# Patient Record
Sex: Male | Born: 1985 | Race: Black or African American | Hispanic: No | Marital: Single
Health system: Southern US, Community
[De-identification: ages and names within clinical notes are randomized; demographics above are authoritative.]

## PROBLEM LIST (undated history)

## (undated) DIAGNOSIS — R569 Unspecified convulsions: Secondary | ICD-10-CM

## (undated) DIAGNOSIS — Q040 Congenital malformations of corpus callosum: Secondary | ICD-10-CM

## (undated) DIAGNOSIS — Q048 Other specified congenital malformations of brain: Secondary | ICD-10-CM

## (undated) DIAGNOSIS — L02214 Cutaneous abscess of groin: Secondary | ICD-10-CM

## (undated) DIAGNOSIS — J189 Pneumonia, unspecified organism: Secondary | ICD-10-CM

## (undated) DIAGNOSIS — G40901 Epilepsy, unspecified, not intractable, with status epilepticus: Secondary | ICD-10-CM

## (undated) DIAGNOSIS — G809 Cerebral palsy, unspecified: Secondary | ICD-10-CM

## (undated) HISTORY — DX: Unspecified convulsions: R56.9

## (undated) HISTORY — DX: Congenital malformations of corpus callosum: Q04.0

## (undated) HISTORY — PX: STRABISMUS SURGERY: SHX218

## (undated) HISTORY — DX: Cutaneous abscess of groin: L02.214

## (undated) HISTORY — DX: Pneumonia, unspecified organism: J18.9

## (undated) HISTORY — DX: Other specified congenital malformations of brain: Q04.8

## (undated) HISTORY — DX: Epilepsy, unspecified, not intractable, with status epilepticus: G40.901

---
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
## 2006-04-24 ENCOUNTER — Ambulatory Visit: Payer: Self-pay | Admitting: Family Medicine

## 2006-07-13 ENCOUNTER — Ambulatory Visit: Payer: Self-pay | Admitting: Family Medicine

## 2006-09-25 ENCOUNTER — Emergency Department (HOSPITAL_COMMUNITY): Admission: EM | Admit: 2006-09-25 | Discharge: 2006-09-26 | Payer: Self-pay | Admitting: Emergency Medicine

## 2006-12-15 ENCOUNTER — Ambulatory Visit: Payer: Self-pay | Admitting: Family Medicine

## 2007-01-13 ENCOUNTER — Ambulatory Visit: Payer: Self-pay | Admitting: Family Medicine

## 2007-01-14 DIAGNOSIS — R569 Unspecified convulsions: Secondary | ICD-10-CM

## 2007-01-14 DIAGNOSIS — L2089 Other atopic dermatitis: Secondary | ICD-10-CM

## 2007-01-14 DIAGNOSIS — F819 Developmental disorder of scholastic skills, unspecified: Secondary | ICD-10-CM | POA: Insufficient documentation

## 2007-01-14 DIAGNOSIS — G809 Cerebral palsy, unspecified: Secondary | ICD-10-CM

## 2007-07-14 ENCOUNTER — Ambulatory Visit: Payer: Self-pay | Admitting: Family Medicine

## 2007-12-27 ENCOUNTER — Encounter (INDEPENDENT_AMBULATORY_CARE_PROVIDER_SITE_OTHER): Payer: Self-pay | Admitting: Family Medicine

## 2008-02-14 ENCOUNTER — Encounter (INDEPENDENT_AMBULATORY_CARE_PROVIDER_SITE_OTHER): Payer: Self-pay | Admitting: Family Medicine

## 2008-03-02 ENCOUNTER — Ambulatory Visit: Payer: Self-pay | Admitting: Family Medicine

## 2008-03-02 DIAGNOSIS — L02214 Cutaneous abscess of groin: Secondary | ICD-10-CM

## 2008-03-02 HISTORY — DX: Cutaneous abscess of groin: L02.214

## 2008-03-29 ENCOUNTER — Encounter (INDEPENDENT_AMBULATORY_CARE_PROVIDER_SITE_OTHER): Payer: Self-pay | Admitting: Family Medicine

## 2008-06-07 ENCOUNTER — Ambulatory Visit: Payer: Self-pay | Admitting: Family Medicine

## 2008-06-07 ENCOUNTER — Encounter (INDEPENDENT_AMBULATORY_CARE_PROVIDER_SITE_OTHER): Payer: Self-pay | Admitting: Family Medicine

## 2008-06-20 ENCOUNTER — Encounter (INDEPENDENT_AMBULATORY_CARE_PROVIDER_SITE_OTHER): Payer: Self-pay | Admitting: Family Medicine

## 2008-07-03 ENCOUNTER — Encounter (INDEPENDENT_AMBULATORY_CARE_PROVIDER_SITE_OTHER): Payer: Self-pay | Admitting: Family Medicine

## 2008-07-14 ENCOUNTER — Ambulatory Visit: Payer: Self-pay | Admitting: Family Medicine

## 2008-08-15 ENCOUNTER — Encounter (INDEPENDENT_AMBULATORY_CARE_PROVIDER_SITE_OTHER): Payer: Self-pay | Admitting: Family Medicine

## 2008-10-31 ENCOUNTER — Telehealth (INDEPENDENT_AMBULATORY_CARE_PROVIDER_SITE_OTHER): Payer: Self-pay | Admitting: Family Medicine

## 2008-11-01 ENCOUNTER — Ambulatory Visit: Payer: Self-pay | Admitting: Family Medicine

## 2008-12-08 ENCOUNTER — Encounter (INDEPENDENT_AMBULATORY_CARE_PROVIDER_SITE_OTHER): Payer: Self-pay | Admitting: Family Medicine

## 2008-12-26 ENCOUNTER — Encounter (INDEPENDENT_AMBULATORY_CARE_PROVIDER_SITE_OTHER): Payer: Self-pay | Admitting: Family Medicine

## 2009-04-02 ENCOUNTER — Encounter: Admission: RE | Admit: 2009-04-02 | Discharge: 2009-07-01 | Payer: Self-pay | Admitting: Psychiatry

## 2009-04-18 ENCOUNTER — Encounter: Payer: Self-pay | Admitting: Family Medicine

## 2009-04-25 ENCOUNTER — Encounter (INDEPENDENT_AMBULATORY_CARE_PROVIDER_SITE_OTHER): Payer: Self-pay | Admitting: Family Medicine

## 2009-04-27 ENCOUNTER — Ambulatory Visit: Payer: Self-pay | Admitting: Family Medicine

## 2009-04-27 ENCOUNTER — Telehealth: Payer: Self-pay | Admitting: *Deleted

## 2009-04-30 ENCOUNTER — Ambulatory Visit: Payer: Self-pay | Admitting: Family Medicine

## 2009-05-16 ENCOUNTER — Encounter: Payer: Self-pay | Admitting: Family Medicine

## 2009-06-14 ENCOUNTER — Encounter: Payer: Self-pay | Admitting: Family Medicine

## 2009-06-23 ENCOUNTER — Emergency Department (HOSPITAL_COMMUNITY): Admission: EM | Admit: 2009-06-23 | Discharge: 2009-06-23 | Payer: Self-pay | Admitting: Emergency Medicine

## 2009-06-29 ENCOUNTER — Ambulatory Visit: Payer: Self-pay | Admitting: Family Medicine

## 2009-08-01 ENCOUNTER — Encounter: Payer: Self-pay | Admitting: Family Medicine

## 2009-08-01 DIAGNOSIS — N3946 Mixed incontinence: Secondary | ICD-10-CM | POA: Insufficient documentation

## 2009-08-02 ENCOUNTER — Encounter: Payer: Self-pay | Admitting: Family Medicine

## 2009-08-28 ENCOUNTER — Encounter: Payer: Self-pay | Admitting: Family Medicine

## 2009-11-13 ENCOUNTER — Encounter: Payer: Self-pay | Admitting: Family Medicine

## 2010-05-23 ENCOUNTER — Encounter: Payer: Self-pay | Admitting: Family Medicine

## 2010-05-28 ENCOUNTER — Encounter: Payer: Self-pay | Admitting: Family Medicine

## 2010-07-02 ENCOUNTER — Ambulatory Visit: Payer: Self-pay | Admitting: Family Medicine

## 2010-07-02 ENCOUNTER — Encounter: Payer: Self-pay | Admitting: Family Medicine

## 2010-09-13 ENCOUNTER — Ambulatory Visit: Payer: Self-pay | Admitting: Family Medicine

## 2010-11-12 ENCOUNTER — Encounter: Payer: Self-pay | Admitting: Family Medicine

## 2010-12-06 ENCOUNTER — Encounter: Payer: Self-pay | Admitting: Family Medicine

## 2010-12-17 NOTE — Assessment & Plan Note (Signed)
Summary: seizures,df   Vital Signs:  Patient profile:   25 year old male Weight:      97.8 pounds Temp:     98.0 degrees F Pulse (ortho):   96 / minute Resp:     16 per minute BP sitting:   126 / 84  Vitals Entered By: Jone Baseman CMA (September 13, 2010 2:09 PM) CC: seizures   Primary Care Tomi Paddock:  Levander Campion MD  CC:  seizures.  History of Present Illness: 1. Seizures:  Pt is here for evaluation of his seizures.  Pt with a hx of cerebral palsy and a seizure disorder.  He has been having more frequent seizures over the past week.  He normally has 1-2 seizures a month however, in the past week he has had 10 seizures.  They are the same seizures that he normally has but are more frequent.  They cannot think of any reason why he could be having more seizures.  He is taking the medicines as prescribed.  He is otherwise acting like himself.  Has had no fevers, eating well, normal stools and voids.  ROS: denies fevers, increased stiffness, cough, rashes, ulcers, abdominal pain, shortness of breath  Current Medications (verified): 1)  Lamictal 200 Mg Tabs (Lamotrigine) .Marland Kitchen.. 1 By Mouth Two Times A Day 2)  Triamcinolone Acetonide 0.1 % Crea (Triamcinolone Acetonide) .... Apply A Small Amount To Affected Area Twice A Day. Dispense One Tube. 3)  Fleet Enema 7-19 Gm/164ml Enem (Sodium Phosphates) .Marland Kitchen.. 1 Enema Q 3 Days As Needed Constipation 4)  Glycolax  Powd (Polyethylene Glycol 3350) .Marland Kitchen.. 1 Teaspoon Po Daily 5)  Physical Therapy Assessment .... To Asses Ongoing Pt Needs and Identify Any Durable Equipment Needed For Daily Functioning 6)  Anti-Spasticity Ball Splints .... Bilateral Left and Right Hand Wrise  Dx- Cerebal Palsy 7)  Hip Abductor Wedge .... Dx- Cerebral Palsy 8)  Lamictal 25 Mg Tabs (Lamotrigine) .... 2 Tabs By Mouth Two Times A Day For Total of 250mg  Two Times A Day 9)  Sulfamethoxazole-Trimethoprim 400-80 Mg/70ml Soln (Sulfamethoxazole-Trimethoprim) .... 3 Teaspoons 3  Times Per Day Qs X 7 Days  Allergies: No Known Drug Allergies  Past History:  Past Medical History: Reviewed history from 06/29/2009 and no changes required. Absence of corpus callosum, Colpocephaly w/large temproal horns, Elevated third ventricle, Floppy lid syndrome, Partial seizures with secondary generalization, Spastic quadriparesis  Neurologist- Dr. Sharene Skeans Dr. Myrtis Ser- [facility]  Social History: Reviewed history from 06/29/2009 and no changes required. Lives with mom, dad in [place].   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  Only child.;attends [employer] Likes loud toys, slams cabinets, enjoys wheel of fortune  Physical Exam  General:  alert, in wheelchair. spastic. Vital signs noted  Head:  normocephalic and atraumatic.   Eyes:  vision grossly intact, pupils equal, pupils round, pupils reactive to light, ,  Ears:  TM clear bilat, no erythema Nose:  no nasal discharge.   Mouth:  moist mucus membranes with copious drool Neck:  supple and no masses.   Lungs:  Normal respiratory effort, chest expands symmetrically. Lungs are clear to auscultation, no crackles or wheezes. Heart:  Normal rate and regular rhythm. S1 and S2 normal without gallop, murmur, click, rub or other extra sounds. Abdomen:  Bowel sounds positive,abdomen soft and non-tender.  Msk:  moves upper extremities equally, upper extremity contractures  Extremities:  No edema, contracture upper ext- hands Neurologic:  alert nonverbal cries out    Skin:  no rashes or suspicious skin lesions  Impression & Recommendations:  Problem # 1:  CONVULSIONS, SEIZURES, NOS (ICD-780.39) Assessment Deteriorated  Having more frequent seizures that at baseline.  No medical reasons like an infection identified.  Taking his medicines as prescribed.  Will arrange for close follow up with Dr. Sharene Skeans.  Precautions given. His updated medication list for this problem includes:    Lamictal 200 Mg Tabs (Lamotrigine) .Marland Kitchen... 1 by mouth two  times a day    Lamictal 25 Mg Tabs (Lamotrigine) .Marland Kitchen... 2 tabs by mouth two times a day for total of 250mg  two times a day  Orders: [facility]- Est Level  3 ([address])  Complete Medication List: 1)  Lamictal 200 Mg Tabs (Lamotrigine) .Marland Kitchen.. 1 by mouth two times a day 2)  Triamcinolone Acetonide 0.1 % Crea (Triamcinolone acetonide) .... Apply a small amount to affected area twice a day. dispense one tube. 3)  Fleet Enema 7-19 Gm/161ml Enem (Sodium phosphates) .Marland Kitchen.. 1 enema q 3 days as needed constipation 4)  Glycolax Powd (Polyethylene glycol 3350) .Marland Kitchen.. 1 teaspoon po daily 5)  Physical Therapy Assessment  .... To asses ongoing pt needs and identify any durable equipment needed for daily functioning 6)  Anti-spasticity Ball Splints  .... Bilateral left and right hand wrise  dx- cerebal palsy 7)  Hip Abductor Wedge  .... Dx- cerebral palsy 8)  Lamictal 25 Mg Tabs (Lamotrigine) .... 2 tabs by mouth two times a day for total of 250mg  two times a day 9)  Sulfamethoxazole-trimethoprim 400-80 Mg/20ml Soln (Sulfamethoxazole-trimethoprim) .... 3 teaspoons 3 times per day qs x 7 days  Patient Instructions: 1)  We will work on trying to get you in to see Dr. Sharene Skeans as soon as we can. 2)  We will call you with that appointment. 3)  I cannot see any reason why he is having more seizures from a medical standpoint 4)  If he continues to have seizures or he develops new symptoms like fevers, cough, increased stiffness then I would take him to the ED.   Orders Added: 1)  [facility]- Est Level  3 [[address]454]  Appended Document: seizures,df Next available appointment set up with Dr. Sharene Skeans on 11/07.  Mother called and made aware. {USER.REALNAME}  {DATETIMESTAMP()}

## 2010-12-17 NOTE — Miscellaneous (Signed)
Summary: Physical form   Patients father dropped off forms to be filled out.  Please call him when completed. Bradly Bienenstock  May 28, 2010 9:07 AM forms to pcp.Golden Circle RN  May 28, 2010 9:23 AM

## 2010-12-17 NOTE — Miscellaneous (Signed)
Summary: Orders Update  Clinical Lists Changes  Medications: Added new medication of * PHYSICAL THERAPY ASSESSMENT To asses ongoing PT needs and identify any durable equipment needed for daily functioning - Signed Added new medication of * ANTI-SPASTICITY BALL SPLINTS Bilateral Left and Right hand Dortha Schwalbe  Dx- Cerebal Palsy - Signed Added new medication of * HIP ABDUCTOR WEDGE Dx- Cerebral Palsy - Signed Rx of PHYSICAL THERAPY ASSESSMENT To asses ongoing PT needs and identify any durable equipment needed for daily functioning;  #0 x 0;  Signed;  Entered by: Milinda Antis MD;  Authorized by: Milinda Antis MD;  Method used: Print then Give to Patient Rx of ANTI-SPASTICITY BALL SPLINTS Bilateral Left and Right hand Dortha Schwalbe  Dx- Cerebal Palsy;  #0 x 0;  Signed;  Entered by: Milinda Antis MD;  Authorized by: Milinda Antis MD;  Method used: Print then Give to Patient Rx of HIP ABDUCTOR WEDGE Dx- Cerebral Palsy;  #0 x 0;  Signed;  Entered by: Milinda Antis MD;  Authorized by: Milinda Antis MD;  Method used: Print then Give to Patient    Prescriptions: HIP ABDUCTOR WEDGE Dx- Cerebral Palsy  #0 x 0   Entered and Authorized by:   Milinda Antis MD   Signed by:   Milinda Antis MD on 05/23/2010   Method used:   Print then Give to Patient   RxID:   [id number] ANTI-SPASTICITY BALL SPLINTS Bilateral Left and Right hand Dortha Schwalbe  Dx- Cerebal Palsy  #0 x 0   Entered and Authorized by:   Milinda Antis MD   Signed by:   Milinda Antis MD on 05/23/2010   Method used:   Print then Give to Patient   RxID:   [id number] PHYSICAL THERAPY ASSESSMENT To asses ongoing PT needs and identify any durable equipment needed for daily functioning  #0 x 0   Entered and Authorized by:   Milinda Antis MD   Signed by:   Milinda Antis MD on 05/23/2010   Method used:   Print then Give to Patient   RxID:   [id number]

## 2010-12-17 NOTE — Consult Note (Signed)
Summary: HH Discharge summary  [facility] Discharge summary   Imported By: De Nurse 11/27/2009 13:50:12  _____________________________________________________________________  External Attachment:    Type:   Image     Comment:   External Document

## 2010-12-17 NOTE — Letter (Signed)
Summary: [facility] Medical Exam  [facility] Medical Exam   Imported By: Clydell Hakim 07/05/2010 15:56:25  _____________________________________________________________________  External Attachment:    Type:   Image     Comment:   External Document

## 2010-12-17 NOTE — Assessment & Plan Note (Signed)
Summary: cpe/eo   Vital Signs:  Patient profile:   25 year old male Temp:     98.1 degrees F axillary Pulse rate:   88 / minute BP sitting:   112 / 73  (right arm) Cuff size:   regular  Vitals Entered By: Tessie Fass CMA (July 02, 2010 1:58 PM) CC: CPE   Primary Care Provider:  Levander Campion MD  CC:  CPE.  History of Present Illness:   25 y.o male with severe CP , seizure disorder followed by Dr. Sharene Skeans presents for CPE, yearly PE exam sheets signed for patient as well.  1. CP- severe CP, recently broke wheelchair needs a script for repair, incontinence supplies reordered.. Pt attends [employer]. For multiple contractures gets Botox injections at [facility] [place] - Dr. 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 Seizure- only med Lamictal tolerating well,no rash. Followed by Dr. Sharene Skeans,- yearly labs due - within next week , increase in seizures, probable change in meds   3.boils on bottom-- x 1 week, using alchol, neosporin, perioxide ROS- no fever     Current Medications (verified): 1)  Lamictal 200 Mg Tabs (Lamotrigine) .Marland Kitchen.. 1 By Mouth Two Times A Day 2)  Triamcinolone Acetonide 0.1 % Crea (Triamcinolone Acetonide) .... Apply A Small Amount To Affected Area Twice A Day. Dispense One Tube. 3)  Fleet Enema 7-19 Gm/172ml Enem (Sodium Phosphates) .Marland Kitchen.. 1 Enema Q 3 Days As Needed Constipation 4)  Glycolax  Powd (Polyethylene Glycol 3350) .Marland Kitchen.. 1 Teaspoon Po Daily 5)  Physical Therapy Assessment .... To Asses Ongoing Pt Needs and Identify Any Durable Equipment Needed For Daily Functioning 6)  Anti-Spasticity Ball Splints .... Bilateral Left and Right Hand Wrise  Dx- Cerebal Palsy 7)  Hip Abductor Wedge .... Dx- Cerebral Palsy 8)  Lamictal 25 Mg Tabs (Lamotrigine) .... 2 Tabs By Mouth Two Times A Day For Total of 250mg  Two Times A Day 9)  Sulfamethoxazole-Trimethoprim 400-80  Mg/1ml Soln (Sulfamethoxazole-Trimethoprim) .... 3 Teaspoons 3 Times Per Day Qs X 7 Days  Allergies (verified): No Known Drug Allergies  Past History:  Past Medical History: Last updated: 06/29/2009 Absence of corpus callosum, Colpocephaly w/large temproal horns, Elevated third ventricle, Floppy lid syndrome, Partial seizures with secondary generalization, Spastic quadriparesis  [facility]  Physical Exam  General:  alert, in wheelchair. spastic. Vital signs noted  Eyes:  vision grossly intact, pupils equal, pupils round, pupils reactive to light, ,  Ears:  TM clear bilat, no erythema Mouth:  moist mucus membranes with copious drool Lungs:  Normal respiratory effort, chest expands symmetrically. Lungs are clear to auscultation, no crackles or wheezes. Heart:  Normal rate and regular rhythm. S1 and S2 normal without gallop, murmur, click, rub or other extra sounds. Abdomen:  Bowel sounds positive,abdomen soft and non-tender.  Rectal:  no external abnormalities.   Genitalia:  no penile lesions, tested descended no hernia  Msk:  moves all upper extremities equally, upper extremity contractures able to extend right finger more Skin:  multiple 2-9mm boils on right buttock,  few lesions open with no drainage, +erythema, TTP, mild induration Inguinal Nodes:  R inguinal LN enlarged and R inguinal LN tender.  Mobile   Impression & Recommendations:  Problem # 1:  PHYSICAL EXAMINATION (ICD-V70.0) Assessment New  Increase in seizure activity defer to neurology for meds, labs by Neuro No acute sick visits forms completed  Orders: [facility] - Est  18-39 yrs ([address])  Problem # 2:  URINARY INCONTINENCE, MIXED (ICD-788.33) Assessment: Unchanged  Orders: [facility] - Est  18-39 yrs (60454)  Problem # 3:  BOILS, RECURRENT (ICD-680.9) Assessment: New  Will treat with Bactrim liquid DS as pt has incontinence and some lesions open with erythema and mild cellulitic  changes. given red flags  Orders: [facility] - Est  18-39 yrs ([address]11)  Problem # 4:  CEREBRAL PALSY (ICD-343.9) Assessment: Unchanged  Orders: [facility] - Est  18-39 yrs (91478)  Complete Medication List: 1)  Lamictal 200 Mg Tabs (Lamotrigine) .Marland Kitchen.. 1 by mouth two times a day 2)  Triamcinolone Acetonide 0.1 % Crea (Triamcinolone acetonide) .... Apply a small amount to affected area twice a day. dispense one tube. 3)  Fleet Enema 7-19 Gm/138ml Enem (Sodium phosphates) .Marland Kitchen.. 1 enema q 3 days as needed constipation 4)  Glycolax Powd (Polyethylene glycol 3350) .Marland Kitchen.. 1 teaspoon po daily 5)  Physical Therapy Assessment  .... To asses ongoing pt needs and identify any durable equipment needed for daily functioning 6)  Anti-spasticity Ball Splints  .... Bilateral left and right hand wrise  dx- cerebal palsy 7)  Hip Abductor Wedge  .... Dx- cerebral palsy 8)  Lamictal 25 Mg Tabs (Lamotrigine) .... 2 tabs by mouth two times a day for total of 250mg  two times a day 9)  Sulfamethoxazole-trimethoprim 400-80 Mg/58ml Soln (Sulfamethoxazole-trimethoprim) .... 3 teaspoons 3 times per day qs x 7 days  Other Orders: Tdap => 72yrs IM (29562) Admin 1st Vaccine (13086)  Patient Instructions: 1)  Return if he develops fever or the boils increase in size or multiply 2)  Otherwise next routine exam in 1 year 3)  Today he received his Tdap booster Prescriptions: GLYCOLAX  POWD (POLYETHYLENE GLYCOL 3350) 1 Teaspoon po daily  #30 x 12   Entered and Authorized by:   Milinda Antis MD   Signed by:   Milinda Antis MD on 07/02/2010   Method used:   Electronically to        [facility]* (retail)       [address]       [place], [place]  [address]       Ph: [phone]       Fax: [phone]   RxID:   [id number] FLEET ENEMA 7-19 GM/118ML ENEM (SODIUM PHOSPHATES) 1 enema q 3 days as needed constipation  #30 x 6   Entered and Authorized by:   Milinda Antis MD   Signed by:   Milinda Antis MD on  07/02/2010   Method used:   Electronically to        [facility]* (retail)       [address]       [place], [place]  [id number]       Ph: [phone]       Fax: [phone]   RxID:   [id number] TRIAMCINOLONE ACETONIDE 0.1 % CREA (TRIAMCINOLONE ACETONIDE) apply a small amount to affected area twice a day. dispense one tube.  #1 x 6   Entered and Authorized by:   Milinda Antis MD   Signed by:   Milinda Antis MD on 07/02/2010   Method used:   Electronically to        [facility]* (retail)       [address]       [place], [place]  [address]       Ph: [phone]  Fax: [phone]   RxID:   [id number] SULFAMETHOXAZOLE-TRIMETHOPRIM 400-80 MG/5ML SOLN (SULFAMETHOXAZOLE-TRIMETHOPRIM) 3 teaspoons 3 times per day QS x 7 days  #150 x 0   Entered and Authorized by:   Milinda Antis MD   Signed by:   Milinda Antis MD on 07/02/2010   Method used:   Electronically to        [facility] (retail)       [address]       [place], [place]  [address]       Ph: [phone]       Fax: [phone]   RxID:   [id number]    Immunizations Administered:  Tetanus Vaccine:    Vaccine Type: Tdap    Site: left deltoid    Mfr: GlaxoSmithKline    Dose: 0.5 ml    Route: IM    Given by: Tessie Fass CMA    Exp. Date: 08/07/2012    Lot #: [id number]    VIS given: 10/05/07 version given July 02, 2010.

## 2010-12-19 NOTE — Miscellaneous (Signed)
  Clinical Lists Changes  Problems: Removed problem of SCREENING EXAMINATION FOR PULMONARY TUBERCULOSIS (ICD-V74.1) Removed problem of FOREIGN BODY IN DIGESTIVE SYSTEM UNSPECIFIED (ICD-938) Removed problem of OTHER ABNORMALITY OF URINATION (ICD-788.69) Removed problem of PHYSICAL EXAMINATION (ICD-V70.0) Removed problem of PRURITUS, OCULAR (ICD-698.8)

## 2010-12-19 NOTE — Miscellaneous (Addendum)
Summary: MEDICAL EQUIPMENT APPROVAL FORM  Pt drop off Medical equipment form. Carlos [name]  December 06, 2010 12:57 PM    Medical Equipment form placed in Dr Deirdre Peer box for signature...Marland KitchenMarland KitchenMarland Kitchen Terese Door  December 06, 2010 2:21 PM  Appended Document: MEDICAL EQUIPMENT APPROVAL FORM Completed   Appended Document: MEDICAL EQUIPMENT APPROVAL FORM Message left for Aarib Pulido at [phone] that the form for [place] is ready to be picked up at front desk.  Terese Door  December 09, 2010 2:37 PM

## 2011-04-04 NOTE — Discharge Summary (Signed)
NAME:  Carlos [name], Carlos [name] NO.:  [id number]   MEDICAL RECORD NO.:  [id number]                   PATIENT TYPE:  INP   LOCATION:  6118                                 FACILITY:  [facility]   PHYSICIAN:  Jeoffrey Massed, M.D.             DATE OF BIRTH:  August 03, 1986   DATE OF ADMISSION:  11/04/2002  DATE OF DISCHARGE:  11/05/2002                                 DISCHARGE SUMMARY   ADMISSION DIAGNOSES:  1. Influenza.  2. Seizure disorder with breakthrough seizures.  3. Mild dehydration.   DISCHARGE DIAGNOSES:  1. Influenza.  2. Seizure disorder with breakthrough seizures.  3. Mild dehydration.   DISCHARGE MEDICATIONS:  1. Amantidine 50 mg per ml syrup 2 teaspoons p.o. b.i.d. times 6 days.  2. Lamictal 75 mg p.o. b.i.d.  3. Ibuprofen 200 mg every 4-6 hours as needed for fever.   CONSULTATIONS:  None.   PROCEDURES:  None.   SERVICE:  Conservation officer, historic buildings.   ATTENDING:  Leighton Roach McDiarmid, M.D.   Valetta FullerChilton Si.   HISTORY OF PRESENT ILLNESS:  For complete history of present illness please  see history and physical in chart.   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  He  presented with less than 48 hours of cough and fever and some vomiting and  had 3 breakthrough seizures.  He was found to have a temperature of 100.9 in  the emergency room and was alert and nontoxic appearing.  His physical exam  was normal with the exception of some muscle contractures which were  chronic.  Lab work revealed a normal CBC and complete metabolic panel as  well as a normal urinalysis.  The rapid flu revealed influenza A positive, B  negative.  Chest x-ray showed no active disease.  The patient was admitted  to the ped floor and begun on Amantidine and hospital course as follows:   1. Influenza.  He was started on Amantidine in the hospital and was able to   take food by mouth with no difficulty and had no emesis.  He was found to     be significantly improved on the day after admission.  He was discharged     home on Amantidine to finish a 7 day course.  2. Seizure disorder.  He had no seizures while in the hospital and he was     kept on his Lamictal as per his home regimen.  It was also recommended     that he continue this upon discharge.   DISPOSITION:  The patient was discharged to home with his parents in  improved condition on the previously mentioned discharge medications.  They  were told to return to the emergency department or call medical doctor with  any questions, especially regarding respiratory status.  Follow-up was  arranged at  [facility] for morning work in clinic on Monday,  12/08/2002, or with Dr. Renold Don, their primary medical doctor if available.                                               Jeoffrey Massed, M.D.    PHM/MEDQ  D:  11/05/2002  T:  11/07/2002  Job:  [id number]   cc:   Emmit Alexanders, M.D.  Family Prac. Resident - [address]  [place], [place] [address]  Fax: [phone]   Deanna Artis. Sharene Skeans, M.D.  [address]  [address]  [place]  [place] [address]  Fax: [phone]

## 2011-04-04 NOTE — Op Note (Signed)
NAME:  WARREN, LINDAHL NO.:  [id number]   MEDICAL RECORD NO.:  [id number]          PATIENT TYPE:  EMS   LOCATION:  ED                           FACILITY:  [facility]   PHYSICIAN:  Carlos [name], M.D.DATE OF BIRTH:  1986/09/24   DATE OF PROCEDURE:  DATE OF DISCHARGE:  09/26/2006                                 OPERATIVE REPORT   I had the pleasure to see Carlos [name] in the emergency room. He is a 25-  year-old male with severe cerebral palsy and significant movement disorder.  The patient habitually bites and sucks his fingers and presents with a deep  abscess about his right thumb. I was asked to see and treat him by emergency  room staff.   The patient is here with his family who are quite nice and pleasant. The  patient does not converse does not speak or follow instructions. He does  have significant rigidity and spasticity which makes examination quite  difficult.   There has been no obvious puncture wound [name] than chronic habitual thumb  sucking and occasionally biting of the area. This patient goes to [facility]  for cerebral palsy needs.   ALLERGIES:  No known drug allergies.   CURRENT MEDICATIONS:  Lamictal.   PAST SURGICAL HISTORY:  1. Gonad removed in the past.  2. Eye surgery.   PAST MEDICAL HISTORY:  Cerebral palsy and an absent corpus callosum.   SOCIAL HISTORY:  The patient is 25 years of age. He lives with his family.  He is an only child. His family is very supportive including his mother and  father who I have meet today.   On examination, he is a pleasant male, alert and oriented. He cannot  converse with me. He has significant spasticity and movement disorder which  makes examination somewhat limited and very difficult. On examination, he  has swollen, tense, tender thumb throughout the soft tissue. He has abnormal  nail discoloration about the ulnar aspect of his nail. Fingers are nontender  and stable. He has no signs of  dystrophy or compartment syndrome. His wrist  is stable. He has mild erythema about the hand noted.   The patient and I have discussed at length examination findings. I have  primarily discussed his upper extremity predicament with his family. I have  reviewed his x-rays which are negative for osteomyelitis ____________  fracture, dislocation.   IMPRESSION:  Deep abscess, right thumb, in a patient who does not converse  and has severe cerebral palsy as well as absent corpus callosum.   PLAN:  I have discussed with the parents the findings. At present time, this  patient needs anesthesia and an IV of his deep abscess. I have consented  them, and they desire to proceed.   The patient was taken to the procedural area. He underwent a thumb block  with lidocaine without difficulty. We very carefully padded him and held him  down for this procedure, and the patient tolerated this. Once this was done,  I thoroughly prepped and draped him with Betadine scrub and paint. Following  this, the  patient had a sterile field secured, and an incision was made. A  deep abscess was evaluated, noted, and cultures were taken. The large  abscess about the pulp region and ascending dorsal region was removed and  devitalized prenecrotic tissue I&D'd. Following this, he was copiously  lavaged with saline, and iodoform gauze was then packed in the wound. He  tolerated this well, was dressed sterilely and had good refill at the  conclusion of the procedure.   I discussed all issues with the patient and his family. We are going to plan  for doxycycline 100 mg 1 p.o. b.i.d. I have given him a prescription for  Lortab Elixir 2.5 mg per 5 mL 1 to 2 mL dispense q.4h. p.r.n. pain. I did  research Lamictal. I did not see any obvious contraindications with Lortab  Elixir. I discussed with the patient's parents his findings. I will plan a  dressing change this weekend in the emergency room. Given his social  situation  and [name] difficulties, I would certainly keep a very close watch  on him. We did feel comfortable watching him this weekend and of course will  have him seen by myself in 24 hours for dressing change and iodoform gauze  removal. They left the ER comfortable. It was a pleasure to see him today.  We will keep a very close on him and try to return his infection to  quiescence. This was a deep abscess I&D performed at 12:30 to 1:00 a.m.  September 26, 2006.           ______________________________  Carlos [name], M.D.     Carlos [name]  D:  09/26/2006  T:  09/26/2006  Job:  [id number]   cc:   Carlos Libra, MD  Fax: [phone]

## 2011-04-04 NOTE — Op Note (Signed)
[place]  Patient:    Carlos [name], Carlos [name] Visit Number: [id number] MRN: [id number]          Service Type: SUR Location: PEDS 6123 01 Attending Physician:  Leonia Corona Dictated by:   Judie Petit. Leonia Corona, M.D. Admit Date:  09/04/2001 Discharge Date: 09/05/2001   CC:         Piedad Climes. Roseanne Reno, M.D.   Operative Report  PREOPERATIVE DIAGNOSES: 1. 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 2. Possible left inguinal hernia associated. 3. Mental retardation.  POSTOPERATIVE DIAGNOSES: 1. Left testicular torsion with nonviable, gangrenous testis and epididymis. 2. No obvious hernia.  PROCEDURES: 1. Exploration of left scrotal contents through left groin. 2. Left orchiectomy with high ligation of cord. 3. Right orchiopexy through scrotal incision.  ANESTHESIA:  General endotracheal tube anesthesia.  SURGEON:  Nelida Meuse, M.D.  ASSISTANT:  Nurse.  INDICATION FOR THE PROCEDURE:  This 25 year old male child was noted to have a swollen testis noted by the parents about 14 hours prior to the examination. An ultrasound showed absolutely no blood flow on the left side.  Right testis showed normal blood flow.  Physical examination also revealed possible left groin hernia.  Hence, the inguinal approach.  DESCRIPTION OF PROCEDURE:  The patient is brought in the operating room, placed supine on the operating table.  The general endotracheal tube anesthesia was induced.  Both the groin and the scrotal areas were cleaned and shaved.  The routine cleaning, prepping, and draping was done.  We started with the left inguinal crease incision.  Incision was made with knife starting just left of midline in the inguinal skin crease, extending laterally for about 3-4 cm, the incision deepened through the subcutaneous tissues with electrocautery until the external aponeurosis was exposed.  The inferior edge of the external aponeurosis is identified.   The external inguinal ring is identified.  A Glorious Peach is introduced into the external inguinal ring and incised over the Freer to open the inguinal canal.  The cord structures were bulging through that with a swollen balloon-like structure, which was dissected free of the cord structures, which were freed by blunt dissection and hooked up over Penrose drain.  The left scrotal contents was delivered through the incision into the inguinal incision.  The covering of the testis was noted to be completely black without any evidence of blood flow.  It was swollen with no evidence of any perfusion even after incising over the tunica albuginea.  A decision was made to do an orchiectomy because of nonviability of the testis and epididymis.  The cord structures were inspected for presence of any hernial sac.  No sac was noted; hence, we therefore decided to do a high ligation and orchiectomy.  Double clamps were applied near the internal ring with the cord structures in two parts and then the cord was divided, removing the testis with the cord out of the field.  Transfixed ligation using 2-0 silk was done, and then the stump was allowed to fall back deep into the internal ring.  The wound was irrigated with normal saline and the scrotum was inspected for any oozing or bleeding.  No bleeding was noted.  The inguinal canal was repaired using 2-0 Vicryl interrupted stitches.  The wound was closed in two layers, the deeper layer using 4-0 Vicryl interrupted stitches, the skin with 4-0 Monocryl subcuticular stitch.  Approximately 8 cc of 0.25% Marcaine with epinephrine was infiltrated in and around the incision for postoperative pain  control.  We now turned to the right testis to do a prophylactic fixation in the scrotum.  A paramedian scrotal incision was made after holding the testis by the assistant.  The incision was made along all layers of the scrotal skin, and finally the testis was exposed, which  was fixated to the scrotal pouch using 4-0 Vicryl interrupted stitches on both sides.  The wound was closed in two layers, the deeper layer using 4-0 Vicryl interrupted stitch and scrotal skin with 5-0 chromic catgut, which was kept open.  The left groin incision was covered with Steri-Strips and a sterile gauze and Tegaderm dressing.  The patient tolerated the procedure very well, which was smooth and uneventful.  The patient was later extubated and transported to the recovery room in good and stable condition. Dictated by:   Judie Petit. Leonia Corona, M.D. Attending Physician:  Leonia Corona DD:  09/05/01 TD:  09/06/01 Job: [id number]

## 2011-07-02 ENCOUNTER — Other Ambulatory Visit: Payer: Self-pay | Admitting: Family Medicine

## 2011-07-02 ENCOUNTER — Telehealth: Payer: Self-pay | Admitting: Family Medicine

## 2011-07-02 DIAGNOSIS — G801 Spastic diplegic cerebral palsy: Secondary | ICD-10-CM

## 2011-07-02 NOTE — Telephone Encounter (Signed)
Denny Peon,   I have written an electronic order for the OT and PT that I believe needs to be sent to Wanda Plump at the [facility] - fax#[phone]. I attempted to route these to you so you could print them out and then we could call Dad to pick this form up. However, I couldn't find your name in the directory. Will you print these out for me?   Regarding the 2 sets of orders for [employer], I am not sure whether those are supposed to be faxed back or if Dad wants to pick those up as well. I'm not sure why he would. Anyway, I am going to place all of Quntin's papers back in my mailbox in the office tomorrow sometime in the early afternoon. I am technically on vacation starting tomorrow, so if you could either fax them on my behalf or give them back to Dad then I would greatly appreciate it.   Also, on a broader scale, if I have breeched the proper lines of communication of how to complete these requests, then I sincerely apologize. I just don't know whether these are supposed to be faxed or returned to Dad, or something different altogether.   I appreciate all of your help. Please call me on Thursday 8/16 if you have any questions [phone].   Zenaida Niece

## 2011-07-02 NOTE — Telephone Encounter (Signed)
Will forward to PCP 

## 2011-07-02 NOTE — Progress Notes (Signed)
I have a received a request from Carlos [name], [employer] case Production designer, theatre/television/film at [employer] [facility] [place], [employer]. He is in need of further physical and occupational therapy for improved use of a bath chair. Therefore, I have written electronic orders for these services. I intend to print them and give them to Carlos [name] on [employer].

## 2011-07-02 NOTE — Telephone Encounter (Signed)
pts father came in the office & dropped off orders [facility] is requesting, pt needs PT & OT. Left papers in MD box, needs asap.

## 2011-07-03 NOTE — Telephone Encounter (Signed)
Faxed orders back to [facility] & called Dad to let him know.

## 2011-07-10 ENCOUNTER — Ambulatory Visit (INDEPENDENT_AMBULATORY_CARE_PROVIDER_SITE_OTHER): Admitting: Family Medicine

## 2011-07-10 ENCOUNTER — Encounter: Payer: Self-pay | Admitting: Family Medicine

## 2011-07-10 VITALS — BP 103/67 | HR 80 | Wt 97.0 lb

## 2011-07-10 DIAGNOSIS — R569 Unspecified convulsions: Secondary | ICD-10-CM

## 2011-07-10 DIAGNOSIS — B350 Tinea barbae and tinea capitis: Secondary | ICD-10-CM

## 2011-07-10 DIAGNOSIS — L738 Other specified follicular disorders: Secondary | ICD-10-CM

## 2011-07-10 HISTORY — DX: Other specified follicular disorders: L73.8

## 2011-07-10 MED ORDER — MUPIROCIN 2 % EX OINT: TOPICAL_OINTMENT | Freq: Three times a day (TID) | CUTANEOUS | Status: AC

## 2011-07-10 MED ORDER — MUPIROCIN CALCIUM 2 % EX CREA: TOPICAL_CREAM | Freq: Three times a day (TID) | CUTANEOUS | Status: DC

## 2011-07-10 NOTE — Assessment & Plan Note (Signed)
Start topical mupirocin 2% TID. Return to clinic if the condition does not improve.

## 2011-07-10 NOTE — Progress Notes (Signed)
  Subjective:    Patient ID: Carlos [name], male    DOB: 09/10/1986, 25 y.o.   MRN: [id number]  HPI Carlos [name] is 25 y.o. M with cerebral palsy and seizure disorder presenting to annual physical. History provided by father.   1. Seizures - increased frequency, 8x last week, last seizure 07/05/11 unknown cause, all generalized tonic-clonic, duration 1-2 minutes, patient post-ictal afterwards no medication changes, still taking Lamictal 250mg  BID, no abortive med, no medical treatment sought, will schedule appoint with neurologist Dr. Sharene Skeans  2. Bumps on face - onset 1 month ago, small, non-tender, non-red, non-pustular, located in facial hair, bother patient evidenced by rubbing of his face on chair, bleeding 2/2 abrasion, father has tried OTC medication to dry out bumps, no history of similar papules or other locations on the patient's body, some have resolved spontaneously    Review of Systems  Constitutional: Negative.   HENT: Negative.   Respiratory: Negative.   Cardiovascular: Negative.   Gastrointestinal:       Constipation persistent  Genitourinary: Negative.   Musculoskeletal:       No changes from baseline       Objective:   Physical Exam BP 103/67  Pulse 80  Wt 97 lb (43.999 kg)  Gen: alert, awake, NAD, no verbal communication HEENT: NCAT, multiple papular lesions in a follicular distribution on left side of chin, non-pustular, non-bleeding, non-erythematous base  Ears: TM reflective bilaterally  Eyes: PERRLA, EOMI Mouth: excessive drooling and constant tongue protrusion Cardiac: RRR, no murmurs, rubs or gallops Lungs: CTA - B Abd: Soft, NDNT, normal bowel sounds, no hepatosplenomegaly Skin: no evidence of pressure ulcers on extremities or buttocks Extremities: flexion contracture of LUE, warm and well perfused        Assessment & Plan:  Carlos [name] is  25 y.o. AAM with cerebral palsy, mental retardation, and seizure disorder who is presenting with increased seizure  frequency and folliculitis barbae. He will need to return in 1 year for a CPE and sooner if necessary.

## 2011-07-10 NOTE — Patient Instructions (Signed)
Thank you for coming to clinic today. It was a pleasure to meet Yznaga. The bumps on his face are most likely a condition called folliculitis. I have called in a prescription for topical antibiotics that should help. Please follow the instructions on the medication. Please return in one year for an annual physical or sooner if needed.   Take Care,   Dr. Clinton Sawyer

## 2011-07-10 NOTE — Assessment & Plan Note (Signed)
Given the increasing frequency of seizures last week, the parents will take Carlos [name] to see his neurologist Dr. Sharene Skeans when available. I will not make any changes to his medical regimen unless asked to do so by Dr. Sharene Skeans.

## 2011-07-10 NOTE — Progress Notes (Signed)
Addended by: Garnetta Buddy on: 07/10/2011 05:24 PM   Modules accepted: Orders

## 2012-07-06 ENCOUNTER — Encounter: Payer: Self-pay | Admitting: Family Medicine

## 2012-07-06 ENCOUNTER — Ambulatory Visit (INDEPENDENT_AMBULATORY_CARE_PROVIDER_SITE_OTHER): Admitting: Family Medicine

## 2012-07-06 VITALS — BP 98/69 | HR 85

## 2012-07-06 DIAGNOSIS — L0292 Furuncle, unspecified: Secondary | ICD-10-CM

## 2012-07-06 DIAGNOSIS — L0293 Carbuncle, unspecified: Secondary | ICD-10-CM

## 2012-07-06 MED ORDER — SULFAMETHOXAZOLE-TRIMETHOPRIM 800-160 MG PO TABS: 1.0000 | ORAL_TABLET | Freq: Two times a day (BID) | ORAL | Status: AC

## 2012-07-06 NOTE — Progress Notes (Signed)
  Subjective:    Patient ID: Carlos [name], male    DOB: June 18, 1986, 26 y.o.   MRN: [id number]  HPI # Knot on groin area Father noticed it a couple of days ago  Patient winces in pain when it touched It has not drained  Review of Systems No fevers/chills/nausea. Patient had good appetite.  Allergies, medication, past medical history reviewed.  Significant for: -Cerebral palsy, mental retardation -Recurrent boils -Epilepsy -Urinary incontinence -Folliculitis barbae    Objective:   Physical Exam GEN: NAD SKIN: 1 x 1 cm indurated, fluctuant lesion center of groin in pubis; tender; non-erythematous   Procedure: I&D of abscess on groin Informed consent received Area prepped with Iodine x 2 0.5 cm incision made with 11-blade scalpel Minimal amount of pus drained even with clearing opening with hemostats Area packed with 0.25 cm of packing Pressure bandage applied    Assessment & Plan:

## 2012-07-06 NOTE — Patient Instructions (Addendum)
Take antibiotic twice a day for 7 days for groin infection  Keep follow-up appointment with Dr. Clinton Sawyer to re-evaluate nodule

## 2012-07-06 NOTE — Assessment & Plan Note (Signed)
Groin abscess I&D but minimal drainage achieved.  -Father advised to remove packing in a few days -Keep area covered with gauze and tape -Take antibiotic for 5 day course -Patient has follow-up appointment with PCP in 3 days for physical and may be re-evaluated at that time

## 2012-07-09 ENCOUNTER — Ambulatory Visit (INDEPENDENT_AMBULATORY_CARE_PROVIDER_SITE_OTHER): Admitting: Family Medicine

## 2012-07-09 ENCOUNTER — Telehealth: Payer: Self-pay | Admitting: *Deleted

## 2012-07-09 ENCOUNTER — Encounter: Admitting: Family Medicine

## 2012-07-09 ENCOUNTER — Encounter: Payer: Self-pay | Admitting: Family Medicine

## 2012-07-09 VITALS — BP 93/51 | HR 97 | Temp 97.5°F | Wt 101.0 lb

## 2012-07-09 DIAGNOSIS — F79 Unspecified intellectual disabilities: Secondary | ICD-10-CM

## 2012-07-09 DIAGNOSIS — L738 Other specified follicular disorders: Secondary | ICD-10-CM

## 2012-07-09 DIAGNOSIS — L259 Unspecified contact dermatitis, unspecified cause: Secondary | ICD-10-CM

## 2012-07-09 DIAGNOSIS — L2089 Other atopic dermatitis: Secondary | ICD-10-CM

## 2012-07-09 DIAGNOSIS — L309 Dermatitis, unspecified: Secondary | ICD-10-CM

## 2012-07-09 DIAGNOSIS — Z0000 Encounter for general adult medical examination without abnormal findings: Secondary | ICD-10-CM

## 2012-07-09 DIAGNOSIS — G809 Cerebral palsy, unspecified: Secondary | ICD-10-CM

## 2012-07-09 DIAGNOSIS — R569 Unspecified convulsions: Secondary | ICD-10-CM

## 2012-07-09 DIAGNOSIS — L02214 Cutaneous abscess of groin: Secondary | ICD-10-CM

## 2012-07-09 MED ORDER — TRIAMCINOLONE ACETONIDE 0.1 % EX CREA: TOPICAL_CREAM | Freq: Two times a day (BID) | CUTANEOUS | Status: DC | PRN

## 2012-07-09 MED ORDER — MUPIROCIN CALCIUM 2 % EX CREA: TOPICAL_CREAM | Freq: Three times a day (TID) | CUTANEOUS | Status: AC

## 2012-07-09 NOTE — Telephone Encounter (Signed)
Huntley Dec from [facility] calling to let us know that Bactroban 2% cream requires PA through [facility] but the ointment does not.  Wanting to know if okay to give Bactroban 2% ointment instead.  Verbal okay given to change RX to ointment with same directions.  Ileana Ladd

## 2012-07-09 NOTE — Progress Notes (Signed)
  Subjective:    Patient ID: Carlos [name], male    DOB: 22-Apr-1986, 26 y.o.   MRN: [id number]  HPI  26 year old M with cerebral palsy, seizure disorder, and mental retardation who presents for an annual physical. This physical is required for his daily program, [employer]. Recent health events included incision of drainage of an abscess close to the base of his penis.  1. Abscess - Drained 07/06/12 by Dr. Madolyn Frieze.  His father pulled out the packing yesterday and denies any drainage. He is taking Septra for the infection. His father notes that he is still tender on that area.   2. Seizure Disorder - The patient is treated by Ames Coupe, a neurologist. Dr. Sharene Skeans recently added Keppra 500 mg daily to his regimen of lamotrigine 250 mg BID. No recent seizure activity.   3. Rash - Left lateral neck. Started a few days ago. Red but not tender or bothersome to patient. No similar rashes on the patient's body.    Review of Systems  Unable to perform ROS      Objective:   Physical Exam  Constitutional: He appears well-nourished. No distress.       Very thin body habitus  HENT:  Head: Normocephalic and atraumatic.  Right Ear: External ear normal.  Left Ear: External ear normal.  Eyes: Pupils are equal, round, and reactive to light.  Cardiovascular: Normal rate, regular rhythm and normal heart sounds.   Pulmonary/Chest: Effort normal and breath sounds normal.  Abdominal: Soft. Bowel sounds are normal.  Genitourinary:     Musculoskeletal:       Decreased ROM in all extremities except RUE   Neurological: He is alert. He exhibits abnormal muscle tone.       Does not speak, but makes audible noises. Increased muscle tone is all extremities.   Skin: Rash noted. Rash is macular.          Multiple inflamed follicles on chin  Psychiatric:       Congnitive deficits    BP 93/51  Pulse 97  Temp 97.5 F (36.4 C) (Axillary)  Wt 101 lb (45.813 kg)      Assessment & Plan:  26  year old M with cerebral palsy and mental retardation who has multiple dermatologic issues including eczema, folliculitis barbae, and an peri-genital abscess.

## 2012-07-10 NOTE — Assessment & Plan Note (Signed)
Management with Lamictal and Keppra per Dr. Sharene Skeans, pediatric neurologist.

## 2012-07-10 NOTE — Assessment & Plan Note (Signed)
S/p I &D on 8/20. He also started a 7 day course of bactrim. It is tender, but does not appears to be worsening. Continue bactrim. Reevaluate in one week if not resolved.

## 2012-07-10 NOTE — Assessment & Plan Note (Signed)
Continue triamcinolone cream PRN.

## 2012-07-10 NOTE — Assessment & Plan Note (Signed)
Continue mupirocin BID PRN.

## 2012-07-14 ENCOUNTER — Telehealth: Payer: Self-pay | Admitting: Family Medicine

## 2012-07-14 NOTE — Telephone Encounter (Signed)
Needs a script for a wheelchair accessment - needs to say on it:  Diagnosis - 318.2 Accessment for wheel chair eval Meets medical necessity  pls fax to [phone] attn: Sherral Hammers  Her cell # [phone]

## 2012-07-14 NOTE — Telephone Encounter (Signed)
Will forward to Dr Williamson 

## 2012-07-14 NOTE — Telephone Encounter (Signed)
Script written and placed in fax pile. Message left with the family and with Sherral Hammers noting that the script was written.

## 2012-10-22 ENCOUNTER — Other Ambulatory Visit: Payer: Self-pay | Admitting: Family Medicine

## 2012-11-03 ENCOUNTER — Ambulatory Visit (INDEPENDENT_AMBULATORY_CARE_PROVIDER_SITE_OTHER): Admitting: Family Medicine

## 2012-11-03 ENCOUNTER — Encounter: Payer: Self-pay | Admitting: Family Medicine

## 2012-11-03 VITALS — BP 100/60 | HR 88 | Ht 60.0 in | Wt 100.0 lb

## 2012-11-03 DIAGNOSIS — H53009 Unspecified amblyopia, unspecified eye: Secondary | ICD-10-CM

## 2012-11-03 DIAGNOSIS — L2089 Other atopic dermatitis: Secondary | ICD-10-CM

## 2012-11-03 NOTE — Assessment & Plan Note (Signed)
Referral to Dr. Maple Hudson, pediatric ophthalmologist, at request of parents.

## 2012-11-03 NOTE — Assessment & Plan Note (Signed)
Continue triamcinolone

## 2012-11-03 NOTE — Progress Notes (Signed)
  Subjective:    Patient ID: Carlos [name], male    DOB: Jun 16, 1986, 26 y.o.   MRN: [id number]  HPI  26 year old M with hx of cerebral palsy and mental retardation who presents with his parents for evaluation of "eye problems" and a rash.  1. 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 This was performed in [place], where his father was stationed. Over the last year, his parents have noticed deviation of the eye when he is fatigued. It will resolve once he has rested. Otherwise, they do not know any notice any other issues with his eyes. However, they would like a referral to an ophthalmologist for evaluation. He has seen Dr. Darylene Price in the past.   2. Rash on neck - Anterior. Itching and patient tries to scratch with his chin . Dark Plaques. No bumps or swelling. Has been treated for eczema in the past.   Review of Systems Negative unless stated in HPI    Objective:   Physical Exam BP 100/60  Pulse 88  Ht 5' (1.524 m)  Wt 100 lb (45.36 kg)  BMI 19.53 kg/m2 Gen: young AAM, confined to wheelchair, awake, makes noises but non-conversant, generalized increased tone Eyes: PERRLA, sclera non icteric, no dysconjugate gaze, patient does not follow commands Skin: 3 small hyperpigmented plaques on anterior neck      Assessment & Plan:  Referral for ophtho. Continue steroid cream for eczema. F/u as needed.   Si Raider Clinton Sawyer, MD, MBA 11/03/2012, 5:27 PM Family Medicine Resident, PGY-2 [phone] pager

## 2012-11-03 NOTE — Patient Instructions (Signed)
We will make a referral for Carlos [name] to see Dr. Maple Hudson. Please don't be concerned that this condition is affecting his vision on causing him pain.   Take Care,    Dr. Clinton Sawyer

## 2012-11-15 ENCOUNTER — Ambulatory Visit: Attending: Family Medicine | Admitting: Physical Therapy

## 2012-11-15 DIAGNOSIS — R293 Abnormal posture: Secondary | ICD-10-CM | POA: Insufficient documentation

## 2012-11-15 DIAGNOSIS — IMO0001 Reserved for inherently not codable concepts without codable children: Secondary | ICD-10-CM | POA: Insufficient documentation

## 2013-02-17 ENCOUNTER — Other Ambulatory Visit: Payer: Self-pay

## 2013-02-17 ENCOUNTER — Other Ambulatory Visit: Payer: Self-pay | Admitting: Family

## 2013-02-17 DIAGNOSIS — G40219 Localization-related (focal) (partial) symptomatic epilepsy and epileptic syndromes with complex partial seizures, intractable, without status epilepticus: Secondary | ICD-10-CM

## 2013-02-17 DIAGNOSIS — G40319 Generalized idiopathic epilepsy and epileptic syndromes, intractable, without status epilepticus: Secondary | ICD-10-CM

## 2013-02-17 MED ORDER — LAMICTAL 25 MG PO TABS: ORAL_TABLET | ORAL | Status: DC

## 2013-03-06 ENCOUNTER — Encounter (HOSPITAL_COMMUNITY): Payer: Self-pay | Admitting: Cardiology

## 2013-03-06 ENCOUNTER — Inpatient Hospital Stay (HOSPITAL_COMMUNITY)
Admission: EM | Admit: 2013-03-06 | Discharge: 2013-03-08 | DRG: 101 | Disposition: A | Discharge status: Home / Self Care | Attending: Family Medicine | Admitting: Family Medicine

## 2013-03-06 ENCOUNTER — Observation Stay (HOSPITAL_COMMUNITY)

## 2013-03-06 DIAGNOSIS — D72829 Elevated white blood cell count, unspecified: Secondary | ICD-10-CM | POA: Diagnosis present

## 2013-03-06 DIAGNOSIS — L738 Other specified follicular disorders: Secondary | ICD-10-CM

## 2013-03-06 DIAGNOSIS — N3946 Mixed incontinence: Secondary | ICD-10-CM

## 2013-03-06 DIAGNOSIS — R569 Unspecified convulsions: Secondary | ICD-10-CM | POA: Diagnosis present

## 2013-03-06 DIAGNOSIS — H53009 Unspecified amblyopia, unspecified eye: Secondary | ICD-10-CM

## 2013-03-06 DIAGNOSIS — G3184 Mild cognitive impairment, so stated: Secondary | ICD-10-CM | POA: Diagnosis present

## 2013-03-06 DIAGNOSIS — F79 Unspecified intellectual disabilities: Secondary | ICD-10-CM

## 2013-03-06 DIAGNOSIS — F819 Developmental disorder of scholastic skills, unspecified: Secondary | ICD-10-CM | POA: Diagnosis present

## 2013-03-06 DIAGNOSIS — Z79899 Other long term (current) drug therapy: Secondary | ICD-10-CM

## 2013-03-06 DIAGNOSIS — L2089 Other atopic dermatitis: Secondary | ICD-10-CM

## 2013-03-06 DIAGNOSIS — E876 Hypokalemia: Secondary | ICD-10-CM | POA: Diagnosis present

## 2013-03-06 DIAGNOSIS — G809 Cerebral palsy, unspecified: Secondary | ICD-10-CM | POA: Diagnosis present

## 2013-03-06 DIAGNOSIS — G40909 Epilepsy, unspecified, not intractable, without status epilepticus: Principal | ICD-10-CM | POA: Diagnosis present

## 2013-03-06 HISTORY — DX: Cerebral palsy, unspecified: G80.9

## 2013-03-06 LAB — CBC WITH DIFFERENTIAL/PLATELET
Basophils Absolute: 0 10*3/uL (ref 0.0–0.1)
Basophils Relative: 0 % (ref 0–1)
Eosinophils Relative: 0 % (ref 0–5)
Hemoglobin: 14.5 g/dL (ref 13.0–17.0)
Lymphs Abs: 2.8 10*3/uL (ref 0.7–4.0)
Neutro Abs: 7.5 10*3/uL (ref 1.7–7.7)
Neutrophils Relative %: 65 % (ref 43–77)
WBC: 11.5 10*3/uL — ABNORMAL HIGH (ref 4.0–10.5)

## 2013-03-06 LAB — COMPREHENSIVE METABOLIC PANEL
AST: 26 U/L (ref 0–37)
Alkaline Phosphatase: 84 U/L (ref 39–117)
BUN: 12 mg/dL (ref 6–23)
Glucose, Bld: 134 mg/dL — ABNORMAL HIGH (ref 70–99)
Total Bilirubin: 0.4 mg/dL (ref 0.3–1.2)

## 2013-03-06 LAB — URINALYSIS, ROUTINE W REFLEX MICROSCOPIC
Bilirubin Urine: NEGATIVE
Ketones, ur: 15 mg/dL — AB
Protein, ur: 100 mg/dL — AB
Urobilinogen, UA: 0.2 mg/dL (ref 0.0–1.0)
pH: 5.5 (ref 5.0–8.0)

## 2013-03-06 LAB — RAPID URINE DRUG SCREEN, HOSP PERFORMED
Barbiturates: NOT DETECTED
Benzodiazepines: NOT DETECTED
Cocaine: NOT DETECTED
Tetrahydrocannabinol: NOT DETECTED

## 2013-03-06 MED ORDER — POTASSIUM CHLORIDE IN NACL 40-0.9 MEQ/L-% IV SOLN
INTRAVENOUS | Status: DC
  Administered 2013-03-06: 125 mL/h via INTRAVENOUS
  Administered 2013-03-07 – 2013-03-08 (×4): via INTRAVENOUS
  Filled 2013-03-06 (×7): qty 1000

## 2013-03-06 MED ORDER — LORAZEPAM 2 MG/ML IJ SOLN
INTRAMUSCULAR | Status: AC
  Administered 2013-03-06: 1 mg via INTRAVENOUS
  Filled 2013-03-06: qty 1

## 2013-03-06 MED ORDER — SODIUM CHLORIDE 0.9 % IV SOLN
500.0000 mg | Freq: Two times a day (BID) | INTRAVENOUS | Status: DC
  Administered 2013-03-06 – 2013-03-08 (×4): 500 mg via INTRAVENOUS
  Filled 2013-03-06 (×6): qty 5

## 2013-03-06 MED ORDER — LAMOTRIGINE 200 MG PO TABS
200.0000 mg | ORAL_TABLET | Freq: Every day | ORAL | Status: DC
  Administered 2013-03-06 – 2013-03-08 (×3): 200 mg via ORAL
  Filled 2013-03-06 (×3): qty 1

## 2013-03-06 MED ORDER — SODIUM CHLORIDE 0.9 % IJ SOLN: 3.0000 mL | Freq: Two times a day (BID) | INTRAMUSCULAR | Status: DC

## 2013-03-06 MED ORDER — SODIUM CHLORIDE 0.9 % IV BOLUS (SEPSIS)
500.0000 mL | Freq: Once | INTRAVENOUS | Status: AC
  Administered 2013-03-06: 500 mL via INTRAVENOUS

## 2013-03-06 MED ORDER — LORAZEPAM 2 MG/ML IJ SOLN
1.0000 mg | Freq: Once | INTRAMUSCULAR | Status: AC
  Administered 2013-03-06: 1 mg via INTRAVENOUS

## 2013-03-06 MED ORDER — SODIUM CHLORIDE 0.9 % IV SOLN
Freq: Once | INTRAVENOUS | Status: AC
  Administered 2013-03-06: 14:00:00 via INTRAVENOUS

## 2013-03-06 MED ORDER — ENOXAPARIN SODIUM 40 MG/0.4ML ~~LOC~~ SOLN
40.0000 mg | SUBCUTANEOUS | Status: DC
  Administered 2013-03-07: 40 mg via SUBCUTANEOUS
  Filled 2013-03-06: qty 0.4

## 2013-03-06 MED ORDER — LAMOTRIGINE 25 MG PO TABS
25.0000 mg | ORAL_TABLET | Freq: Every day | ORAL | Status: DC
  Administered 2013-03-06 – 2013-03-08 (×3): 25 mg via ORAL
  Filled 2013-03-06 (×3): qty 1

## 2013-03-06 MED ORDER — FLEET PEDIATRIC 3.5-9.5 GM/59ML RE ENEM
1.0000 | ENEMA | RECTAL | Status: DC | PRN
  Filled 2013-03-06: qty 1

## 2013-03-06 MED ORDER — SODIUM CHLORIDE 0.9 % IV SOLN
500.0000 mg | Freq: Once | INTRAVENOUS | Status: AC
  Administered 2013-03-06: 500 mg via INTRAVENOUS
  Filled 2013-03-06: qty 5

## 2013-03-06 NOTE — ED Notes (Signed)
Gave report to cheryl on [address].  Pt has orders for npo but also was ordered po meds.  Admitting md contacted regarding this.

## 2013-03-06 NOTE — ED Provider Notes (Signed)
History     CSN: [id number]  Arrival date & time 03/06/13  1146   First MD Initiated Contact with Patient 03/06/13 1223      Chief Complaint  Patient presents with  . Seizures    (Consider location/radiation/quality/duration/timing/severity/associated sxs/prior treatment) HPI History obtained from patient's parents  Patient is a 27 year old male past medical history significant for cerebral palsy with history of grand mal seizures presenting to ED with three days of increased seizure activity (5-6 seizures since Friday). Patient had been receiving Keppra and Lamictal as prescribed until today when parents were unable to give patient medications d/t seizure activity when they brought him to the ED. Parents state that the patient seems more lethargic lately, but do not note that he has been recently ill. Patient has had no changes in the doses of his medications lately, he has been getting his levels checked and he is due for a recheck in another week. Patient does not take any other PO medications at home. Parents state that he has had decreased PO intake the last few days. Patient is level V caveat.   Past Medical History  Diagnosis Date  . Groin abscess 03/02/2008  . Cerebral palsy     History reviewed. No pertinent past surgical history.  History reviewed. No pertinent family history.  History  Substance Use Topics  . Smoking status: Never Smoker   . Smokeless tobacco: Not on file  . Alcohol Use: Not on file      Review of Systems  Unable to perform ROS: Patient nonverbal    Allergies  Review of patient's allergies indicates no known allergies.  Home Medications   Current Outpatient Rx  Name  Route  Sig  Dispense  Refill  . lamoTRIgine (LAMICTAL) 200 MG tablet   Oral   Take 200 mg by mouth daily.         Marland Kitchen lamoTRIgine (LAMICTAL) 25 MG tablet   Oral   Take 25 mg by mouth daily.         Marland Kitchen levETIRAcetam (KEPPRA) 500 MG tablet   Oral   Take 500 mg by mouth  every 12 (twelve) hours.         . sodium phosphate Pediatric (FLEET) 3.5-9.5 GM/59ML enema   Rectal   Place 1 enema rectally every three (3) days as needed for constipation.         . triamcinolone cream (KENALOG) 0.1 %   Topical   Apply 1 application topically 2 (two) times daily as needed (for eczema).           BP 135/68  Pulse 168  Temp(Src) 99.6 F (37.6 C) (Rectal)  Resp 52  SpO2 85%  Physical Exam  Constitutional: He appears well-developed and well-nourished.  Patient appears lethargic and unresponsive. Parents state this is abnormal from baseline.   HENT:  Head: Normocephalic and atraumatic.  Nose: Nose normal.  Mouth/Throat: Mucous membranes are dry and not cyanotic. Lacerations present.  Small healing cut on lateral portion of tip of tongue. No other signs of lacerations or breaks in oropharynx. No active bleeding.   Eyes: Pupils are equal, round, and reactive to light.  Neck: Neck supple.  Cardiovascular: Normal rate and regular rhythm.   Pulmonary/Chest: Breath sounds normal. No accessory muscle usage. He has no decreased breath sounds. He has no wheezes. He has no rhonchi. He has no rales.  Neurological:  Unable to perform full neurologic testing d/t condition and unresponsiveness of patient.   Skin: Skin  is warm and dry.    ED Course  Procedures (including critical care time)  Medications  0.9 %  sodium chloride infusion ( Intravenous New Bag/Given 03/06/13 1336)  sodium chloride 0.9 % bolus 500 mL (0 mLs Intravenous Stopped 03/06/13 1335)  levETIRAcetam (KEPPRA) 500 mg in sodium chloride 0.9 % 100 mL IVPB (500 mg Intravenous New Bag/Given 03/06/13 1332)  LORazepam (ATIVAN) injection 1 mg (1 mg Intravenous Given 03/06/13 1332)   One witnessed seizure while patient in ED, given 1mg  IV Ativan given. Seizure resolved.   Hospitalist and Neurologist consulted for admission.   Labs Reviewed  CBC WITH DIFFERENTIAL  COMPREHENSIVE METABOLIC PANEL   URINALYSIS, ROUTINE W REFLEX MICROSCOPIC   No results found.   1. Seizure   2. Cerebral palsy       MDM  Patient presents for three days of increased seizure activity despite at home seizure suppression medications. No signs of trauma or stressors to account for lowered seizure threshold. Based on history and laboratory findings, hospitalist and neurologist consulted for admission for the patient with further evaluation and work up.The patient appears reasonably stabilized for admission considering the current resources, flow, and capabilities available in the ED at this time, and I doubt any other [facility] requiring further screening and/or treatment in the ED prior to admission. Patient will be admitted to [facility] Medicine service. Patient d/w with Dr. Karma Ganja, agrees with plan.           Jeannetta Ellis, PA-C 03/06/13 1725

## 2013-03-06 NOTE — ED Notes (Signed)
Pt here with seizures over the past 2 days. Mother reports that he is handicap and he has seizures as part of his disability. States she has been unable to give him anything by mouth and is worried that he is dehydrated. Pt is warm to touch. Hx of cerebral palsy. Pt with active seizure at triage.

## 2013-03-06 NOTE — ED Notes (Signed)
Admitting md sts he will change the orders for pt regarding po meds and npo status.

## 2013-03-06 NOTE — ED Notes (Signed)
Attempted to give report.  Nurse states that no paperwork was given to her and will have to be called back in 10.

## 2013-03-06 NOTE — H&P (Signed)
Family Medicine Teaching [facility] Admission History and Physical Service Pager: [phone]  Patient name: Carlos [name] Medical record number: [id number] Date of birth: 1986/03/08 Age: 27 y.o. Gender: male  Primary Care Provider: Mat Carne, MD  Chief Complaint: increased seizures  Assessment and Plan:  Carlos [name] is a 27 y.o. year old male with PMH of cerebral palsy, mental retardation, and seizure disorder here with increased frequency of seizures for 2 days. He has had 4 seizures in the past day. He usually has 3 seizures per month. He seizures are generalized tonic-clonic seizures with a post-ictal state that last 1-2 hrs. A new feature to his seizures is yelling out.  He has very good support from his mother and father and so it does not seem to be the case that medication compliance is an issue. With him being afebrile, with only mild leukocytosis, and showing no signs of illness we dont feel that an infectious etiology is likely. One possibility is that his seizure threshold has just decreased and he may require increased doses or additional medications to control his seizures now.   Seizure disorder - hemodynamically stable, intermittent  - admit to telemetry - Will continue home medications for now- Lamictal 225 mg qd, Keppra 500 BID - with emesis earlier today we will get a CXR to rule out aspiration pneumonia.  - UA without evidence for UTI, will culture urine - Add on UDS to previous Urine, although this seems unlikely - Plan 1-2 Mg Ativan IV for seizures lasting more than 5 minutes.  - If no cause is found and he continues to have seizures we will consult neurology - f/u seizures,   Hypokalemia - possibly from decreased PO intake - Replace IV with NPO status  - 2 X 10 mEq Iv + IV NS with 40 KCl at 125/hr  FEN/GI: NPO until more alert, IV NS with 40 KCl at 125 Prophylaxis: Lovenox Disposition: Tele, home when seizures controlled Code Status:  Full  History of Present Illness: Carlos [name] is a 27 y.o. year old male presenting with history of increased seizures for 2 days. He has ahd several seizures in the last two days including 2 today while in the ED and 1 on the way to the ED. Mom describes them as shaking all over seizures with yelling, which is unusual, and a 1-2 hour state of sleepiness afterwards. He usually has maybe 2-3 seizures per month. She notes normal PO intake recently except for the past 2 days when he has seemed to lose his appetite, which he normally does when he has seizures. He tolerated a smoothie well last night.  She denies fevers, sick contacts, rhinorrhea, abd pain, and dysuria.  Today in teh ED he had an episode of pea-green emesis.   He goes to an after school program called [employer] where he could feesibly have sick contacts. At baseline he does not follow commands.   In the ED he had one seizur elasting less than 2 minutes and an additional seizure which ruired ativan to break. Since that time he has been very sleepy compared to his baseline. His last seizure was around 130 pm.   Patient Active Problem List  Diagnosis  . MENTAL RETARDATION  . CEREBRAL PALSY  . ECZEMA, ATOPIC DERMATITIS  . CONVULSIONS, SEIZURES, NOS  . URINARY INCONTINENCE, MIXED  . Folliculitis barbae  . Amblyopia   Past Medical History: Past Medical History  Diagnosis Date  . Groin abscess 03/02/2008  . Cerebral palsy  Past Surgical History: History reviewed. No pertinent past surgical history. Social History: History  Substance Use Topics  . Smoking status: Never Smoker   . Smokeless tobacco: Not on file  . Alcohol Use: Not on file   For any additional social history documentation, please refer to relevant sections of EMR.  Family History: History reviewed. No pertinent family history. Allergies: No Known Allergies No current facility-administered medications on file prior to encounter.   Current Outpatient  Prescriptions on File Prior to Encounter  Medication Sig Dispense Refill  . levETIRAcetam (KEPPRA) 500 MG tablet Take 500 mg by mouth every 12 (twelve) hours.       Review Of Systems: Per HPI, level 5 caveat applies as patient is non verbal  Physical Exam: BP 113/61  Pulse 99  Temp(Src) 99.6 F (37.6 C) (Rectal)  Resp 12  SpO2 100% Exam: Gen: NAD, somnolent, non verbal, occasionally making grunting and clicking noises, unable to participate in exam HEENT: NCAT, PERRL, no nuchal rigidity  CV: tachy, regular rhythm, good S1/S2, no murmur Resp: CTABL, no wheezes, non-labored Abd: SNTND, BS present, no guarding or organomegaly Ext: No edema, 2+ DP pulses MSK: flexed L wrist, legs appearing to have chronic muscle atrophy Neuro: somnolent, non verbal, moves all four extremities, increased muscle tone in extremities and abdomen.   Labs and Imaging: CBC BMET   Recent Labs Lab 03/06/13 1205  WBC 11.5*  HGB 14.5  HCT 42.2  PLT 322    Recent Labs Lab 03/06/13 1205  NA 139  K 3.3*  CL 96  CO2 13*  BUN 12  CREATININE 0.93  GLUCOSE 134*  CALCIUM 10.5      Recent Labs Lab 03/06/13 1205  AST 26  ALT 11  ALKPHOS 84  BILITOT 0.4  PROT 8.7*  ALBUMIN 4.2    03/06/2013 13:54  Color, Urine YELLOW  APPearance CLEAR  Specific Gravity, Urine 1.034 (H)  pH 5.5  Glucose NEGATIVE  Bilirubin Urine NEGATIVE  Ketones, ur 15 (A)  Protein 100 (A)  Urobilinogen, UA 0.2  Nitrite NEGATIVE  Leukocytes, UA NEGATIVE  Hgb urine dipstick TRACE (A)  WBC, UA 0-2  RBC / HPF 0-2  Squamous Epithelial / LPF FEW (A)  Bacteria, UA RARE  Casts HYALINE CASTS (A)     Kevin Fenton, MD 03/06/2013, 5:24 PM  I examined the patient with Dr. Ermalinda Memos. I have reviewed the note, made necessary revisions and agree with above.  Stevana Dufner 03/06/2013, 8:02 PM

## 2013-03-07 ENCOUNTER — Ambulatory Visit (HOSPITAL_COMMUNITY)

## 2013-03-07 LAB — CBC
Platelets: 279 10*3/uL (ref 150–400)
RDW: 13.8 % (ref 11.5–15.5)
WBC: 7.4 10*3/uL (ref 4.0–10.5)

## 2013-03-07 LAB — BASIC METABOLIC PANEL
Calcium: 9.3 mg/dL (ref 8.4–10.5)
Chloride: 107 mEq/L (ref 96–112)
Creatinine, Ser: 0.83 mg/dL (ref 0.50–1.35)
GFR calc Af Amer: >90 mL/min (ref >90)
Sodium: 142 mEq/L (ref 135–145)

## 2013-03-07 LAB — CK: Total CK: 406 U/L — ABNORMAL HIGH (ref 7–232)

## 2013-03-07 MED ORDER — BIOTENE DRY MOUTH MT LIQD
15.0000 mL | Freq: Two times a day (BID) | OROMUCOSAL | Status: DC
  Administered 2013-03-08: 15 mL via OROMUCOSAL

## 2013-03-07 MED ORDER — CHLORHEXIDINE GLUCONATE 0.12 % MT SOLN
15.0000 mL | Freq: Two times a day (BID) | OROMUCOSAL | Status: DC
  Administered 2013-03-07 – 2013-03-08 (×3): 15 mL via OROMUCOSAL
  Filled 2013-03-07 (×5): qty 15

## 2013-03-07 NOTE — Progress Notes (Signed)
Dr Ermalinda Memos asked if I could call EEG to see if patient could have test completed this pm.  I called and they are behind and said he would not be taken down until tomorrow.  I have informed the family and they are fine with that.  Lance Bosch, RN

## 2013-03-07 NOTE — ED Provider Notes (Signed)
Medical screening examination/treatment/procedure(s) were conducted as a shared visit with non-physician practitioner(s) and myself.  I personally evaluated the patient during the encounter  Pt with active seizing in the department x 2. Also one in triage.  O2 applied, IV ativan given.  Seizure resolvied.  D/w family practice reisdent for admission due to increased seizure activity over his baseline.   CRITICAL CARE Performed by: Ethelda Chick   Total critical care time: 31  Critical care time was exclusive of separately billable procedures and treating other patients.  Critical care was necessary to treat or prevent imminent or life-threatening deterioration.  Critical care was time spent personally by me on the following activities: development of treatment plan with patient and/or surrogate as well as nursing, discussions with consultants, evaluation of patient's response to treatment, examination of patient, obtaining history from patient or surrogate, ordering and performing treatments and interventions, ordering and review of laboratory studies, ordering and review of radiographic studies, pulse oximetry and re-evaluation of patient's condition.  Ethelda Chick, MD 03/07/13 [phone]

## 2013-03-07 NOTE — H&P (Signed)
FMTS Attending Admission Note: Jullian Clayson MD [phone] pager office [phone] I  have seen and examined this patient, reviewed their chart. I have discussed this patient with the resident. I agree with the resident's findings, assessment and care plan. 

## 2013-03-07 NOTE — Progress Notes (Signed)
Family Medicine Teaching Service Daily Progress Note Service Page: [phone]   Subjective:  Per his mother he is back to baseline this am, no seizure activity overnight. She feels he is ready to eat.  Objective: Temp:  [97.7 F (36.5 C)-99.6 F (37.6 C)] 97.7 F (36.5 C) (04/21 0642) Pulse Rate:  [88-168] 104 (04/21 0642) Resp:  [10-52] 16 (04/21 0642) BP: (107-143)/(54-94) 131/80 mmHg (04/21 0642) SpO2:  [85 %-100 %] 100 % (04/21 0642) Weight:  [102 lb 4.8 oz (46.403 kg)-104 lb 4.4 oz (47.3 kg)] 104 lb 4.4 oz (47.3 kg) (04/21 0500) Exam: Gen: NAD, alert, non verbal,  HEENT: NCAT, PERRL  CV: RRR, good S1/S2, no murmur  Resp: CTABL, no wheezes, non-labored  Abd: SNTND, BS present, no guarding or organomegaly  Ext: No edema, 2+ DP pulses  MSK: flexed L wrist, legs appearing to have chronic muscle atrophy  Neuro: alert, non verbal, moves all four extremities, increased muscle tone in extremities and abdomen.   I have reviewed the patient's medications, labs, imaging, and diagnostic testing.  Notable results are summarized below.  CBC BMET   Recent Labs Lab 03/06/13 1205 03/07/13 0615  WBC 11.5* 7.4  HGB 14.5 12.1*  HCT 42.2 35.5*  PLT 322 279    Recent Labs Lab 03/06/13 1205 03/07/13 0615  NA 139 142  K 3.3* 3.8  CL 96 107  CO2 13* 23  BUN 12 12  CREATININE 0.93 0.83  GLUCOSE 134* 77  CALCIUM 10.5 9.3     Imaging/Diagnostic Tests: DG chest port 1 view 03/06/2013 IMPRESSION:  No acute cardiopulmonary disease.  Gas filled bowel loops within the left upper abdomen, nonspecific.  Correlate with abdominal complaints.    Plan: Aasir Daigler is a 27 y.o. year old male with PMH of cerebral palsy, mental retardation, and seizure disorder here with increased frequency of seizures for 2 days.   Seizure disorder - no recurrence overnight - no recurrence overnight - Will continue home medications for now- Lamictal 225 mg qd, Keppra 500 BID  - Child neuro consulted-  we appreciate their reccomendations - CXR and UA without signs of infection, U Cx pending  - negative UDS - Plan 1-2 Mg Ativan IV for seizures lasting more than 5 minutes.  - f/u seizures - Advanced diet  Hypokalemia - resolved - possibly from decreased PO intake  - Replaced IV with NS with 40 KCl at 125/hr   FEN/GI: Dys 3 die- chopped, IV NS with 40 KCl at 125, will dc fluids  Prophylaxis: Lovenox Disposition: Tele, home when seizures controlled  Code Status: Full   Kevin Fenton, MD 03/07/2013, 7:07 AM\

## 2013-03-08 ENCOUNTER — Inpatient Hospital Stay (HOSPITAL_COMMUNITY)
Admit: 2013-03-08 | Discharge: 2013-03-08 | Disposition: A | Discharge status: Home / Self Care | Attending: Family Medicine | Admitting: Family Medicine

## 2013-03-08 DIAGNOSIS — N3946 Mixed incontinence: Secondary | ICD-10-CM

## 2013-03-08 DIAGNOSIS — F79 Unspecified intellectual disabilities: Secondary | ICD-10-CM

## 2013-03-08 LAB — URINE CULTURE: Colony Count: NO GROWTH

## 2013-03-08 LAB — LEVETIRACETAM LEVEL: Levetiracetam Lvl: 21.8 ug/mL (ref 5.0–30.0)

## 2013-03-08 MED ORDER — LAMOTRIGINE 25 MG PO TABS: 25.0000 mg | ORAL_TABLET | Freq: Two times a day (BID) | ORAL | Status: DC

## 2013-03-08 MED ORDER — LAMOTRIGINE 200 MG PO TABS: 200.0000 mg | ORAL_TABLET | Freq: Two times a day (BID) | ORAL | Status: DC

## 2013-03-08 NOTE — Discharge Summary (Signed)
Family Medicine Teaching [facility] Discharge Summary  Patient name: Carlos [name] Medical record number: [id number] Date of birth: [birth year] Age: 27 y.o. Gender: male Date of Admission: 03/06/2013  Date of Discharge: 03/08/2013 Admitting Physician: Nestor Ramp, MD  Primary Care Provider: Mat Carne, MD  Indication for Hospitalization: Siezures Discharge Diagnoses:  1. Seizure disorder 2. Cerebral palsy 3. Cognitive impairment 4. hypokalemia  Consultations: None  Significant Labs and Imaging:   Recent Labs Lab 03/06/13 1205 03/07/13 0615  HGB 14.5 12.1*  HCT 42.2 35.5*  WBC 11.5* 7.4  PLT 322 279    Recent Labs Lab 03/06/13 1205 03/07/13 0615  NA 139 142  K 3.3* 3.8  CL 96 107  CO2 13* 23  GLUCOSE 134* 77  BUN 12 12  CREATININE 0.93 0.83  CALCIUM 10.5 9.3   DG chest 03/06/2013 IMPRESSION:  No acute cardiopulmonary disease.  Gas filled bowel loops within the left upper abdomen, nonspecific.  Correlate with abdominal complaints.  Results for orders placed during the hospital encounter of 03/06/13  URINE CULTURE     Status: None   Collection Time    03/06/13  1:54 PM      Result Value Range Status   Specimen Description URINE, RANDOM   Final   Special Requests NONE   Final   Culture  Setup Time 03/07/2013 01:54   Final   Colony Count NO GROWTH   Final   Culture NO GROWTH   Final   Report Status 03/08/2013 FINAL   Final     Procedures: EEG 03/08/2013  Brief Hospital Course:  Carlos [name] is a 27 y.o. year old male with PMH of cerebral palsy, mental retardation, and seizure disorder here with increased frequency of seizures for 2 days.   Seizure disorder He wasadmitted to our service with history of  2 seizures in teh ED and 4-5 th eprevious two days. On exam he was well appearing and labs were unrevealing except for a slightly elevtaed WBC of 11.2. A CXR and UA were obtanied that did not show evidence of infective process. He required 1 mg  IV ativan in the ED to contro his last seizure there and did not have any additional seizures in the following 36 hours. We continued his home medication regimen and collected keppra and lamictal levels. Dr. Devonne Doughty was contacted who recommended checking anti-epilaeptic levels and EEG to rule out sub-clinical seizures.   After adequate observation without recurrence we felt he was safe to go home. We found no cause for his increased seizure activity as infectiion seems unlikely and complaince is very good with his family. The EEG result is still pending and was collected on the morning of dc. He has an appt with child neurology on 4/28. Details of work up include: - Keppra level 21.8 (5-30)  - Lamictal level 6.1 (3-14)  - Has follow up on 4/28  - negative UDS   Hypokalemia  Resolved and felt to be due to decreased PO intake. Was initailly kept PO and so was replaced in his IV fluids.   Dispo Home with parents   Discharge Medications:    Medication List    TAKE these medications       lamoTRIgine 200 MG tablet  Commonly known as:  LAMICTAL  Take 1 tablet (200 mg total) by mouth 2 (two) times daily.     lamoTRIgine 25 MG tablet  Commonly known as:  LAMICTAL  Take 1 tablet (25 mg total) by mouth 2 (  two) times daily.     levETIRAcetam 500 MG tablet  Commonly known as:  KEPPRA  Take 500 mg by mouth every 12 (twelve) hours.     sodium phosphate Pediatric 3.5-9.5 GM/59ML enema  Place 1 enema rectally every three (3) days as needed for constipation.     triamcinolone cream 0.1 %  Commonly known as:  KENALOG  Apply 1 application topically 2 (two) times daily as needed (for eczema).       Issues for Follow Up:  - ensure adequate antconvulsive regimen as he had increased seizures warranting admission,.  - F/u EEG results   Outstanding Results: EEG   Discharge Instructions: Please refer to Patient Instructions section of EMR for full details.  Patient was counseled important signs  and symptoms that should prompt return to medical care, changes in medications, dietary instructions, activity restrictions, and follow up appointments.       Follow-up Information   Follow up with Deetta Perla, MD On 03/14/2013. (As previopusly scheduled, 915 am)    Contact information:   [address] [place] [address] [phone]       Follow up with Mat Carne, MD On 03/23/2013. (at 10 am)    Contact information:   [address] [place] [address] [phone]       Discharge Condition: Carlena Sax, MD 03/08/2013, 2:41 PM

## 2013-03-08 NOTE — Progress Notes (Signed)
I discussed with  Dr Bradshaw.  I agree with their plans documented in their progress note for today.  

## 2013-03-08 NOTE — Progress Notes (Signed)
Family Medicine Teaching Service Daily Progress Note Service Page: [phone]   Subjective:  Per his father, there have been no additional seizures and he is tolerating PO at baseline. He is back to normal per his report  Going for EEG currentlyat.  Objective: Temp:  [97.3 F (36.3 C)-98.9 F (37.2 C)] 97.3 F (36.3 C) (04/22 0636) Pulse Rate:  [75-107] 75 (04/22 0636) Resp:  [14-18] 18 (04/22 0636) BP: (102-112)/(59-85) 112/59 mmHg (04/22 0636) SpO2:  [99 %-100 %] 99 % (04/22 0636) Weight:  [105 lb 2.6 oz (47.7 kg)] 105 lb 2.6 oz (47.7 kg) (04/21 0721) Exam: Gen: NAD, alert, non verbal,  HEENT: NCAT, PERRL  CV: RRR, good S1/S2, no murmur  Resp: CTABL, no wheezes, non-labored  Abd: SNTND, BS present, no guarding or organomegaly  Ext: No edema, 2+ DP pulses  MSK: flexed L wrist, legs appearing to have chronic muscle atrophy  Neuro: alert, non verbal, moves all four extremities, increased muscle tone in extremities and abdomen.   I have reviewed the patient's medications, labs, imaging, and diagnostic testing.  Notable results are summarized below.  CBC BMET   Recent Labs Lab 03/06/13 1205 03/07/13 0615  WBC 11.5* 7.4  HGB 14.5 12.1*  HCT 42.2 35.5*  PLT 322 279    Recent Labs Lab 03/06/13 1205 03/07/13 0615  NA 139 142  K 3.3* 3.8  CL 96 107  CO2 13* 23  BUN 12 12  CREATININE 0.93 0.83  GLUCOSE 134* 77  CALCIUM 10.5 9.3     Keppra level 21.8 (5-30) Lamictal level 6.1 (3-14)   Imaging/Diagnostic Tests: DG chest port 1 view 03/06/2013 IMPRESSION:  No acute cardiopulmonary disease.  Gas filled bowel loops within the left upper abdomen, nonspecific.  Correlate with abdominal complaints.    Plan: Carlos [name] is a 27 y.o. year old male with PMH of cerebral palsy, mental retardation, and seizure disorder here with increased frequency of seizures for 2 days.   Seizure disorder - no recurrence overnight - no recurrence overnight - Will continue home  medications for now- Lamictal 225 mg qd, Keppra 500 BID  - Child neuro consulted- we appreciate their reccomendations  - Keppra level 21.8 (5-30)  - Lamictal level 6.1 (3-14)  - EEG this am  - Has follow up on 4/28 - CXR and UA without signs of infection, U Cx pending  - negative UDS - Plan 1-2 Mg Ativan IV for seizures lasting more than 5 minutes.  - f/u seizures - tolerating diet normally  Hypokalemia - resolved - possibly from decreased PO intake  - Replaced IV with NS with 40 KCl at 125/hr  - dc labs per family request- I agree  FEN/GI: Dys 3 diet- chopped, IV NS with 40 KCl at 125, will dc fluids  Prophylaxis: Lovenox Disposition: home after EEG, f/u with Dr. Sharene Skeans in 1 week.  Code Status: Full   Kevin Fenton, MD 03/08/2013, 7:12 AM\

## 2013-03-08 NOTE — Progress Notes (Signed)
EEG completed.

## 2013-03-08 NOTE — Discharge Summary (Signed)
I have seen and examined this patient. I have discussed with Dr Bradshaw.  I agree with their findings and plans as documented in their discharge note.   

## 2013-03-08 NOTE — Progress Notes (Signed)
Pt Discharged this afternoon after EEG done, assessments remained unchanged prior to discharge. Medication and discharge instructions given.

## 2013-03-09 NOTE — Procedures (Signed)
EEG NUMBER:  [id number].  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  The patient had increased frequency of seizures in 2 days' duration. (345.10)  PROCEDURE:  The tracing is carried out on a 32-channel digital Cadwell recorder, reformatted into 16-channel montages with 1 devoted to EKG. The patient was awake during the recording and asleep.  International 10/20 system lead placement was used.  He takes Peridex, lamotrigine, levetiracetam.  Recording time 21-1/2 minutes.  DESCRIPTION OF FINDINGS:  Background activity shows 1-2 Hz polymorphic delta range activity of 30 microvolts.  Superimposed upon this in the central regions is rhythmic 3-4 Hz centrally predominant activity of similar amplitude.  The patient drifted in natural sleep manifested by sharply contoured slow waves in the central regions without obvious sleep spindles.  During the record, the patient has occipital triphasic spike and slow wave discharges, most prominent over the right hemisphere, but occasionally is bilateral both independent and synchronous.  In addition, in the same location, a 7 Hz rhythmic theta range activity of 30 microvolts is seen.  Slightly during the record, rare triphasic and sharply contoured slow waves were seen in both temporal regions, right greater than left.  There was no focal slowing.  Interictal activity was described above. Activating procedures were not carried out because of the patient's cognitive impairment.  EKG showed regular sinus rhythm with ventricular response of 90 beats per minute.  IMPRESSION:  Abnormal EEG on the basis of diffuse background slowing. This is a nonspecific indicator of neuronal dysfunction that can be seen on a primary degenerative basis or secondary to a variety of toxic or metabolic etiologies.  In this case, most consistent with the  patient's static encephalopathy.    In addition, the patient has multifocal sharp waves, most prominent in the right occipital and right temporal region, but occasionally seen over the contralateral hemisphere.  This is epileptogenic from electrographic viewpoint recorded with the presence of a mixed seizure disorder, both localization related with or without secondary generalization.  There were no electrographic seizures seen during the record.  There were no behavioral seizures seen during the record.     Deanna Artis. Sharene Skeans, M.D.    ZOX:WRUE D:  03/09/2013 05:Carlos:56  T:  03/09/2013 07:00:17  Job #:  [id number]

## 2013-03-14 ENCOUNTER — Ambulatory Visit (INDEPENDENT_AMBULATORY_CARE_PROVIDER_SITE_OTHER): Payer: Medicaid Other | Admitting: Family

## 2013-03-14 ENCOUNTER — Encounter: Payer: Self-pay | Admitting: Family

## 2013-03-14 VITALS — BP 128/86 | HR 88

## 2013-03-14 DIAGNOSIS — G809 Cerebral palsy, unspecified: Secondary | ICD-10-CM

## 2013-03-14 DIAGNOSIS — M4150 Other secondary scoliosis, site unspecified: Secondary | ICD-10-CM | POA: Insufficient documentation

## 2013-03-14 DIAGNOSIS — G808 Other cerebral palsy: Secondary | ICD-10-CM

## 2013-03-14 DIAGNOSIS — G40319 Generalized idiopathic epilepsy and epileptic syndromes, intractable, without status epilepticus: Secondary | ICD-10-CM | POA: Insufficient documentation

## 2013-03-14 DIAGNOSIS — R569 Unspecified convulsions: Secondary | ICD-10-CM

## 2013-03-14 DIAGNOSIS — G40209 Localization-related (focal) (partial) symptomatic epilepsy and epileptic syndromes with complex partial seizures, not intractable, without status epilepticus: Secondary | ICD-10-CM | POA: Insufficient documentation

## 2013-03-14 DIAGNOSIS — G40219 Localization-related (focal) (partial) symptomatic epilepsy and epileptic syndromes with complex partial seizures, intractable, without status epilepticus: Secondary | ICD-10-CM

## 2013-03-14 DIAGNOSIS — F79 Unspecified intellectual disabilities: Secondary | ICD-10-CM

## 2013-03-14 MED ORDER — LAMOTRIGINE 25 MG PO TABS: ORAL_TABLET | ORAL | Status: DC

## 2013-03-14 MED ORDER — LAMOTRIGINE 200 MG PO TABS: ORAL_TABLET | ORAL | Status: DC

## 2013-03-14 MED ORDER — LEVETIRACETAM 500 MG PO TABS: ORAL_TABLET | ORAL | Status: DC

## 2013-03-14 NOTE — Patient Instructions (Addendum)
Continue giving Lamictal 200mg  1 in the morning and 1 at night. Continue giving Lamictal 25mg  1 in the morning and 1 at night along with the 200mg  tablet. Increase Keppra 500mg  to 1+1/2 in the morning and 1+1/2 at night Call and let me know if seizures continue.  Return for follow up in 6 months or sooner if needed.
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
## 2013-03-16 ENCOUNTER — Encounter: Payer: Self-pay | Admitting: Family

## 2013-03-23 ENCOUNTER — Inpatient Hospital Stay: Admitting: Family Medicine

## 2013-04-01 ENCOUNTER — Other Ambulatory Visit: Payer: Self-pay | Admitting: Family

## 2013-04-01 DIAGNOSIS — G808 Other cerebral palsy: Secondary | ICD-10-CM

## 2013-04-01 MED ORDER — POLYETHYLENE GLYCOL 3350 17 GM/SCOOP PO POWD: ORAL | Status: DC

## 2013-07-15 ENCOUNTER — Ambulatory Visit (INDEPENDENT_AMBULATORY_CARE_PROVIDER_SITE_OTHER): Admitting: Family Medicine

## 2013-07-15 ENCOUNTER — Encounter: Payer: Self-pay | Admitting: Family Medicine

## 2013-07-15 VITALS — BP 104/66 | HR 82

## 2013-07-15 DIAGNOSIS — L909 Atrophic disorder of skin, unspecified: Secondary | ICD-10-CM

## 2013-07-15 DIAGNOSIS — L918 Other hypertrophic disorders of the skin: Secondary | ICD-10-CM
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
## 2013-07-27 ENCOUNTER — Telehealth: Payer: Self-pay | Admitting: Family Medicine

## 2013-07-27 NOTE — Telephone Encounter (Signed)
Referral signed and placed in fax pile

## 2013-07-27 NOTE — Telephone Encounter (Signed)
Nicholos Johns called who is Child psychotherapist for [employer]. She is faxing over forms to get [facility] a Physical therapy evaluation referral.  [place] [phone] work and cell [phone]. She said we can fax the forms  back to [phone]. JW

## 2013-07-27 NOTE — Telephone Encounter (Signed)
Will forward to Dr Williamson 

## 2013-09-27 ENCOUNTER — Other Ambulatory Visit: Payer: Self-pay

## 2013-09-27 DIAGNOSIS — G40219 Localization-related (focal) (partial) symptomatic epilepsy and epileptic syndromes with complex partial seizures, intractable, without status epilepticus: Secondary | ICD-10-CM

## 2013-09-27 DIAGNOSIS — G40319 Generalized idiopathic epilepsy and epileptic syndromes, intractable, without status epilepticus: Secondary | ICD-10-CM

## 2013-09-27 MED ORDER — LAMOTRIGINE 200 MG PO TABS: ORAL_TABLET | ORAL | Status: DC

## 2013-09-27 MED ORDER — LAMOTRIGINE 25 MG PO TABS: ORAL_TABLET | ORAL | Status: DC

## 2013-10-26 ENCOUNTER — Other Ambulatory Visit: Payer: Self-pay | Admitting: Family

## 2013-10-26 DIAGNOSIS — G40309 Generalized idiopathic epilepsy and epileptic syndromes, not intractable, without status epilepticus: Secondary | ICD-10-CM

## 2013-10-26 MED ORDER — LAMICTAL 200 MG PO TABS: ORAL_TABLET | ORAL | Status: DC

## 2013-10-26 MED ORDER — LAMICTAL 25 MG PO TABS: ORAL_TABLET | ORAL | Status: DC

## 2013-10-26 NOTE — Telephone Encounter (Signed)
Previous Rx did not print. TG 

## 2013-11-08 ENCOUNTER — Other Ambulatory Visit: Payer: Self-pay | Admitting: Family

## 2013-11-24 ENCOUNTER — Telehealth: Payer: Self-pay

## 2013-11-24 DIAGNOSIS — G40309 Generalized idiopathic epilepsy and epileptic syndromes, not intractable, without status epilepticus: Secondary | ICD-10-CM

## 2013-11-24 MED ORDER — LAMICTAL 200 MG PO TABS: ORAL_TABLET | ORAL | Status: DC

## 2013-11-24 MED ORDER — LAMICTAL 25 MG PO TABS: ORAL_TABLET | ORAL | Status: DC

## 2013-11-24 NOTE — Telephone Encounter (Signed)
Doug, dad, lvm stating that pt is needing refills on Lamictal 200 mg & 25 mg. He asked that the [facility] be [facility]. Pt has upcoming appt 12/15/13.   Dr. Merri BrunetteNab, These are BMN so they will print and I will need to fax them. Thanks, McK[employer]my

## 2013-11-24 NOTE — Telephone Encounter (Signed)
[name], dad, called and said that he received a call from [name] from [facility]. [facility] [employer]. He said that she told him that pt is due for re certification and that they need a letter from Dr.H stating that pt requires 24 hr care. [name] is his main caregiver. He said that they are asking for justification for the hours that he provides his son with care. Dad said that [name] told him the letter is due by this weekend. I told dad that it was it was short notice and that it may not be done by tomorrow as Dr.H and Inetta Fermoina are not in the office. He expressed understanding and said that he tried explaining that to [name]. Please call dad when letter is completed. He can be reached at [phone] or [phone].

## 2013-11-28 NOTE — Telephone Encounter (Signed)
It is clear that this patient is a total care patient, but I don't think that we can write letters aiming he requires 24-hour care.  I'm not certain if we've written a letter before.  It so I was unable to find it.  I also don't know the number of hours that are provided to him currently.  Please look into this and if you can write the letter.  If you can't, please let me know.

## 2013-11-29 ENCOUNTER — Encounter: Payer: Self-pay | Admitting: Family

## 2013-11-29 NOTE — Telephone Encounter (Signed)
I called and talked to Carlos [name], Doug, to clarify information needed for the letter. I have written the letter and it is ready for signature. Dad will pick up the letter when it is ready. Please call Dad to pick up when letter has been signed. TG

## 2013-11-29 NOTE — Telephone Encounter (Signed)
The letter has been signed. I called Dad and let him know that it is ready to be picked up. TG

## 2013-12-15 ENCOUNTER — Ambulatory Visit (INDEPENDENT_AMBULATORY_CARE_PROVIDER_SITE_OTHER): Admitting: Family

## 2013-12-15 ENCOUNTER — Encounter: Payer: Self-pay | Admitting: Family

## 2013-12-15 VITALS — BP 124/86 | HR 84 | Wt 102.0 lb

## 2013-12-15 DIAGNOSIS — G809 Cerebral palsy, unspecified: Secondary | ICD-10-CM

## 2013-12-15 DIAGNOSIS — G40319 Generalized idiopathic epilepsy and epileptic syndromes, intractable, without status epilepticus: Secondary | ICD-10-CM

## 2013-12-15 DIAGNOSIS — G808 Other cerebral palsy: Secondary | ICD-10-CM

## 2013-12-15 DIAGNOSIS — M4150 Other secondary scoliosis, site unspecified: Secondary | ICD-10-CM

## 2013-12-15 DIAGNOSIS — G40219 Localization-related (focal) (partial) symptomatic epilepsy and epileptic syndromes with complex partial seizures, intractable, without status epilepticus: Secondary | ICD-10-CM

## 2013-12-15 DIAGNOSIS — N3946 Mixed incontinence: Secondary | ICD-10-CM

## 2013-12-15 DIAGNOSIS — F79 Unspecified intellectual disabilities: Secondary | ICD-10-CM
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
## 2013-12-16 ENCOUNTER — Encounter: Payer: Self-pay | Admitting: Family

## 2013-12-16 NOTE — Patient Instructions (Signed)
Continue Carlos [name]'s medications without change for now. Let me know if he has increase in his seizure activity.  I have given you an order for Physical Therapy home assessment. Let me know if you need anything else.  Please plan to return for follow up in 1 year or sooner if needed.

## 2013-12-29 ENCOUNTER — Other Ambulatory Visit: Payer: Self-pay

## 2013-12-29 DIAGNOSIS — G40309 Generalized idiopathic epilepsy and epileptic syndromes, not intractable, without status epilepticus: Secondary | ICD-10-CM

## 2013-12-29 MED ORDER — LAMICTAL 200 MG PO TABS: ORAL_TABLET | ORAL | Status: DC

## 2013-12-29 MED ORDER — LAMICTAL 25 MG PO TABS: ORAL_TABLET | ORAL | Status: DC

## 2014-01-02 ENCOUNTER — Other Ambulatory Visit: Payer: Self-pay

## 2014-01-02 DIAGNOSIS — G40219 Localization-related (focal) (partial) symptomatic epilepsy and epileptic syndromes with complex partial seizures, intractable, without status epilepticus: Secondary | ICD-10-CM

## 2014-01-02 DIAGNOSIS — G40319 Generalized idiopathic epilepsy and epileptic syndromes, intractable, without status epilepticus: Secondary | ICD-10-CM

## 2014-01-02 MED ORDER — LEVETIRACETAM 500 MG PO TABS: ORAL_TABLET | ORAL | Status: DC

## 2014-01-23 ENCOUNTER — Ambulatory Visit: Attending: Family | Admitting: Physical Therapy

## 2014-01-23 ENCOUNTER — Telehealth: Payer: Self-pay

## 2014-01-23 DIAGNOSIS — G40319 Generalized idiopathic epilepsy and epileptic syndromes, intractable, without status epilepticus: Secondary | ICD-10-CM

## 2014-01-23 DIAGNOSIS — M4150 Other secondary scoliosis, site unspecified: Secondary | ICD-10-CM

## 2014-01-23 DIAGNOSIS — G808 Other cerebral palsy: Secondary | ICD-10-CM

## 2014-01-23 DIAGNOSIS — M6281 Muscle weakness (generalized): Secondary | ICD-10-CM | POA: Insufficient documentation

## 2014-01-23 DIAGNOSIS — G40219 Localization-related (focal) (partial) symptomatic epilepsy and epileptic syndromes with complex partial seizures, intractable, without status epilepticus: Secondary | ICD-10-CM

## 2014-01-23 DIAGNOSIS — F79 Unspecified intellectual disabilities: Secondary | ICD-10-CM

## 2014-01-23 NOTE — Telephone Encounter (Signed)
Doug called and said that he just [facility]. They told him that all they need is the prescription, they will order the equipment for pt. Doug asked that we call him when the Rx is ready for pick up.

## 2014-01-23 NOTE — Telephone Encounter (Signed)
Please let Dad know that the Rx is ready to be picked up. Thanks, Inetta Fermoina

## 2014-01-23 NOTE — Telephone Encounter (Signed)
Carlos [name], dad, called he said that pt's case worker asked him to call our office and request a Rx for hospital bed pads. The pads should be for his head and foot boards as well as side rails. Dad unsure of the name of the company he uses for medical equipment. He will call me back with that information. [name] can be reached at [phone] or [phone].

## 2014-01-24 NOTE — Telephone Encounter (Signed)
Called Dad and lvm letting him know that the Rx is ready and has been placed at the front desk for pick up. I let him know our hours of operation.

## 2014-01-27 ENCOUNTER — Telehealth: Payer: Self-pay | Admitting: *Deleted

## 2014-01-27 NOTE — Telephone Encounter (Signed)
[facility] calling to get NPI # for patient's PT.  NPI # given.  Altamese Dilling~Eufelia Veno, BSN, RN-BC

## 2014-01-31 ENCOUNTER — Telehealth: Payer: Self-pay | Admitting: Family Medicine

## 2014-01-31 NOTE — Telephone Encounter (Signed)
Will fwd to PCP for review. .Tanishka Drolet  

## 2014-01-31 NOTE — Telephone Encounter (Signed)
Form signed and faxed

## 2014-01-31 NOTE — Telephone Encounter (Signed)
She will be faxing over a certificate requesting equipment for pt Just needs drs signature.

## 2014-06-28 ENCOUNTER — Encounter: Admitting: Family Medicine

## 2014-07-03 ENCOUNTER — Encounter: Admitting: Family Medicine

## 2014-07-04 ENCOUNTER — Other Ambulatory Visit: Payer: Self-pay | Admitting: Family

## 2014-07-04 DIAGNOSIS — G40309 Generalized idiopathic epilepsy and epileptic syndromes, not intractable, without status epilepticus: Secondary | ICD-10-CM

## 2014-07-04 MED ORDER — LAMICTAL 25 MG PO TABS: ORAL_TABLET | ORAL | Status: DC

## 2014-07-04 MED ORDER — LAMICTAL 200 MG PO TABS: ORAL_TABLET | ORAL | Status: DC

## 2014-07-12 ENCOUNTER — Telehealth: Payer: Self-pay

## 2014-07-12 NOTE — Telephone Encounter (Signed)
Called and left message for patient to make an appointment for evaluation per notes from Dr. Mal Misty.Glennie Hawk

## 2014-07-13 ENCOUNTER — Telehealth: Payer: Self-pay | Admitting: *Deleted

## 2014-07-13 NOTE — Telephone Encounter (Signed)
Patient already scheduled to be seen Thursday coming up.Carlos [name], [name]

## 2014-07-13 NOTE — Telephone Encounter (Signed)
Message copied by Tanna Savoy on Thu Jul 13, 2014  2:29 PM ------      Message from: Jacquelin Hawking A      Created: Wed Jul 12, 2014 12:29 PM       Please call patient to be evaluated. Received form from [facility] that requires me to have evaluated the patient. Thanks! ------

## 2014-07-20 ENCOUNTER — Ambulatory Visit (INDEPENDENT_AMBULATORY_CARE_PROVIDER_SITE_OTHER): Admitting: Family Medicine

## 2014-07-20 VITALS — Temp 98.8°F

## 2014-07-20 DIAGNOSIS — L738 Other specified follicular disorders: Secondary | ICD-10-CM

## 2014-07-20 DIAGNOSIS — Z0000 Encounter for general adult medical examination without abnormal findings: Secondary | ICD-10-CM

## 2014-07-20 DIAGNOSIS — B350 Tinea barbae and tinea capitis: Secondary | ICD-10-CM

## 2014-07-20 NOTE — Patient Instructions (Signed)
Thank you for bringing Tuff to see me today. It was a pleasure.  I am prescribing a sleeve for his arm to see if it will help keep him from chewing.  Please make an appointment to see me in one year  If you have any questions or concerns, please do not hesitate to call the office at [phone].  Sincerely,  Jacquelin Hawking, MD

## 2014-07-20 NOTE — Progress Notes (Signed)
   Subjective:    Patient ID: Carlos [name], male    DOB: 1986-01-08, 28 y.o.   MRN: [id number]  HPI Patient presents for annual visit  Complaints: Rash: right arm. Has been healing with Kenalog but worsens with patient biting. Hair bump: non-painful. Not infected. Have tried warm compress which does not appear to be helping much  Past Medical History  Diagnosis Date  . Groin abscess 03/02/2008  . Cerebral palsy   . Seizures   . Ventriculomegaly of brain, congenital   . Agenesis of corpus callosum   . Pneumonia   . Status epilepticus    Past Surgical History  Procedure Laterality Date  . Strabismus surgery      in [place]   Family History  Problem Relation Age of Onset  . Cancer Paternal Grandfather     Died at 51   History   Social History  . Marital Status: Single    Spouse Name: N/A    Number of Children: N/A  . Years of Education: N/A   Social History Main Topics  . Smoking status: Passive Smoke Exposure - Never Smoker  . Smokeless tobacco: Never Used  . Alcohol Use: No  . Drug Use: No  . Sexual Activity: No   Other Topics Concern  . Not on file   Social History Narrative  . No narrative on file   No Known Allergies   Review of Systems  All other systems reviewed and are negative.      Objective:   Physical Exam  Eyes:  Patient not cooperative.  Cardiovascular: Normal rate, regular rhythm and normal heart sounds.   No murmur heard. Pulmonary/Chest: Effort normal and breath sounds normal. No respiratory distress. He has no wheezes.  Abdominal: Soft. Bowel sounds are normal. He exhibits no distension. There is no tenderness.  Neurological:  Patient with increased muscle tone   Skin:  Palpable nodule on scalp without erythema or tenderness.        Assessment & Plan:   28 yo Male with cerebral palsy.  - Guardian declined blood testing because patient will not tolerate or cooperate with procedure.

## 2014-07-21 ENCOUNTER — Encounter: Payer: Self-pay | Admitting: Family Medicine

## 2014-07-21 NOTE — Assessment & Plan Note (Signed)
Located on scalp. Recommended continued warm compress and applying shave bump cream.

## 2014-07-27 ENCOUNTER — Telehealth: Payer: Self-pay | Admitting: *Deleted

## 2014-07-27 NOTE — Telephone Encounter (Signed)
Caregiver came into clinic today checking status of paperwork. Please call at [phone] when paperwork has been completed.

## 2014-07-27 NOTE — Telephone Encounter (Signed)
Talked with pt father and informed him that paperwork is ready for pick-up. Sloan Takagi CMA

## 2014-10-05 ENCOUNTER — Other Ambulatory Visit: Payer: Self-pay

## 2014-10-05 DIAGNOSIS — G40319 Generalized idiopathic epilepsy and epileptic syndromes, intractable, without status epilepticus: Secondary | ICD-10-CM

## 2014-10-05 DIAGNOSIS — G40219 Localization-related (focal) (partial) symptomatic epilepsy and epileptic syndromes with complex partial seizures, intractable, without status epilepticus: Secondary | ICD-10-CM

## 2014-10-05 MED ORDER — KEPPRA 500 MG PO TABS: ORAL_TABLET | ORAL | Status: DC

## 2014-10-24 ENCOUNTER — Ambulatory Visit (INDEPENDENT_AMBULATORY_CARE_PROVIDER_SITE_OTHER): Admitting: Family Medicine

## 2014-10-24 ENCOUNTER — Other Ambulatory Visit: Payer: Self-pay | Admitting: Family

## 2014-10-24 ENCOUNTER — Encounter: Payer: Self-pay | Admitting: Family Medicine

## 2014-10-24 VITALS — Temp 97.6°F

## 2014-10-24 DIAGNOSIS — H938X2 Other specified disorders of left ear: Secondary | ICD-10-CM

## 2014-10-24 NOTE — Patient Instructions (Signed)
Continue warm compresses to left ear. Also apply small amount of eucerin cream to the spot on ear to keep it moisturized Come back if gets red, more swollen, etc.  Apply warm compress to spot on his R scalp as well Return if this is getting bigger despite this care.  Be well, Dr. Pollie MeyerMcIntyre

## 2014-10-24 NOTE — Progress Notes (Signed)
Patient ID: Carlos [name], male   DOB: 10/20/1986, 28 y.o.   MRN: [id number]  HPI:  Pt presents for a same day appointment to discuss swelling of left ear.  Dad has noticed swollen bump on L tragus for about 1 week. Has been doing warm compresses. Unsure if there was ever a small pit there before. No drainage but now skin seems kind of dry. The area is tender. He wonders if pt is rubbing at the area at night. No fevers. Has not tried any medications. Never had this before.  Dad also brings up at the end of visit that pt has area on R scalp with similar slight swelling. Nontender, no drainage. Wonders if it is from inflamed hair bump. Slowly enlarging.  ROS: See HPI  PMFSH: hx CP, MR, seizures, congenital quadriplegia  PHYSICAL EXAM: Temp(Src) 97.6 F (36.4 C) (Axillary)  Unable to get full vitals as patient flails in wheelchair whenever he is touched. Pulse is RRR via radial pulse. Gen: NAD HEENT: NCAT. L ear tragus with slightly enlarged bump over external surface of tragus. Small pit possibly visible. Exam very difficult as pt flails and turns head back and forth whenever touched on any part of his body. No drainage, warmth, or erythema. Some overlying dry skin. Similar area to R anterior scalp with about 1cm diameter nodule without drainage, tenderness, erythema, or warmth. Neuro: nonverbal, in wheelchair  ASSESSMENT/PLAN:  1. L ear tragus swelling - possibly keratin plug. No signs of acute infection. Continue warm compresses, gently apply lotion to keep it moist. F/u as needed if symptoms worsen or do not improve.   2. R scalp swelling - also suspect folliculitis vs superficial skin irritation. Advised dad start warm compresses to this area and f/u if continues to enlarge or does not improve.  FOLLOW UP: F/u as needed if symptoms worsen or do not improve.   GrenadaBrittany J. Pollie MeyerMcIntyre, MD [facility]

## 2015-01-02 ENCOUNTER — Other Ambulatory Visit: Payer: Self-pay | Admitting: Family

## 2015-01-15 ENCOUNTER — Encounter: Payer: Self-pay | Admitting: Family

## 2015-02-05 ENCOUNTER — Ambulatory Visit: Admitting: Family

## 2015-02-07 ENCOUNTER — Other Ambulatory Visit: Payer: Self-pay | Admitting: Family

## 2015-02-12 ENCOUNTER — Ambulatory Visit (INDEPENDENT_AMBULATORY_CARE_PROVIDER_SITE_OTHER): Admitting: Family

## 2015-02-12 ENCOUNTER — Encounter: Payer: Self-pay | Admitting: Family

## 2015-02-12 VITALS — BP 120/84 | HR 80 | Wt 107.0 lb

## 2015-02-12 DIAGNOSIS — G808 Other cerebral palsy: Secondary | ICD-10-CM

## 2015-02-12 DIAGNOSIS — G40311 Generalized idiopathic epilepsy and epileptic syndromes, intractable, with status epilepticus: Secondary | ICD-10-CM

## 2015-02-12 DIAGNOSIS — G40219 Localization-related (focal) (partial) symptomatic epilepsy and epileptic syndromes with complex partial seizures, intractable, without status epilepticus: Secondary | ICD-10-CM

## 2015-02-12 DIAGNOSIS — G40319 Generalized idiopathic epilepsy and epileptic syndromes, intractable, without status epilepticus: Secondary | ICD-10-CM
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
## 2015-02-14 MED ORDER — KEPPRA 500 MG PO TABS: ORAL_TABLET | ORAL | Status: DC

## 2015-02-14 MED ORDER — LAMICTAL 200 MG PO TABS: 200.0000 mg | ORAL_TABLET | Freq: Two times a day (BID) | ORAL | Status: DC

## 2015-02-14 MED ORDER — LAMICTAL 25 MG PO TABS: 25.0000 mg | ORAL_TABLET | Freq: Two times a day (BID) | ORAL | Status: DC

## 2015-02-14 NOTE — Patient Instructions (Signed)
Continue Shuayb's medications without change. Call me if his seizures increase in frequency or severity, or if you have any other concerns.   Please plan to return for follow up in 1 year or sooner if needed.

## 2015-03-08 ENCOUNTER — Other Ambulatory Visit: Payer: Self-pay | Admitting: Family

## 2015-03-08 DIAGNOSIS — G40219 Localization-related (focal) (partial) symptomatic epilepsy and epileptic syndromes with complex partial seizures, intractable, without status epilepticus: Secondary | ICD-10-CM

## 2015-03-08 DIAGNOSIS — G40319 Generalized idiopathic epilepsy and epileptic syndromes, intractable, without status epilepticus: Secondary | ICD-10-CM

## 2015-03-08 MED ORDER — LAMICTAL 25 MG PO TABS: 25.0000 mg | ORAL_TABLET | Freq: Two times a day (BID) | ORAL | Status: DC

## 2015-04-09 ENCOUNTER — Other Ambulatory Visit: Payer: Self-pay | Admitting: Family

## 2015-04-09 DIAGNOSIS — G40319 Generalized idiopathic epilepsy and epileptic syndromes, intractable, without status epilepticus: Secondary | ICD-10-CM

## 2015-04-09 DIAGNOSIS — G40219 Localization-related (focal) (partial) symptomatic epilepsy and epileptic syndromes with complex partial seizures, intractable, without status epilepticus: Secondary | ICD-10-CM

## 2015-04-09 MED ORDER — KEPPRA 500 MG PO TABS: ORAL_TABLET | ORAL | Status: DC

## 2015-04-09 NOTE — Telephone Encounter (Signed)
This is BMN so I need to fax it to pharmacy: [phone].

## 2015-04-09 NOTE — Addendum Note (Signed)
Addended by: Henderson CloudWILLIAMS, Caeden Foots L on: 04/09/2015 11:01 AM   Modules accepted: Orders

## 2015-05-01 ENCOUNTER — Other Ambulatory Visit: Payer: Self-pay | Admitting: Family

## 2015-07-11 ENCOUNTER — Other Ambulatory Visit: Payer: Self-pay | Admitting: Pediatrics

## 2015-07-11 NOTE — Telephone Encounter (Signed)
Faxed to [facility] 

## 2015-07-24 ENCOUNTER — Ambulatory Visit (INDEPENDENT_AMBULATORY_CARE_PROVIDER_SITE_OTHER): Admitting: Family Medicine

## 2015-07-24 ENCOUNTER — Telehealth: Payer: Self-pay | Admitting: *Deleted

## 2015-07-24 VITALS — BP 124/82 | HR 93 | Temp 98.1°F | Wt 103.0 lb

## 2015-07-24 DIAGNOSIS — H578 Other specified disorders of eye and adnexa: Secondary | ICD-10-CM | POA: Diagnosis not present

## 2015-07-24 DIAGNOSIS — Z0000 Encounter for general adult medical examination without abnormal findings: Secondary | ICD-10-CM

## 2015-07-24 DIAGNOSIS — K5900 Constipation, unspecified: Secondary | ICD-10-CM

## 2015-07-24 DIAGNOSIS — R6889 Other general symptoms and signs: Secondary | ICD-10-CM

## 2015-07-24 DIAGNOSIS — G809 Cerebral palsy, unspecified: Secondary | ICD-10-CM

## 2015-07-24 MED ORDER — CETIRIZINE HCL 5 MG/5ML PO SYRP: 10.0000 mg | ORAL_SOLUTION | Freq: Every day | ORAL | Status: DC

## 2015-07-24 MED ORDER — POLYETHYLENE GLYCOL 3350 17 GM/SCOOP PO POWD: ORAL | Status: DC

## 2015-07-24 NOTE — Progress Notes (Signed)
    Subjective    Carlos [name] is a 29 y.o. male that presents for yearly physical care.   Concerns:  1. Rubbing nose/eyes: Patient does this every day. Parents concerned about allergies. He's had these behaviors but has been worse recently. He has associated sneezing and rhinorrhea. He also has associated clear discharge that is present throughout the day. No fevers.  2. Constipation: He is having very infrequent bowel movements which are difficult, appear uncomfortable and produce ball-like stool. He is currently receiving Miralax 1 capful daily and an enema every 3-4 days.  Past Medical History  Diagnosis Date  . Groin abscess 03/02/2008  . Cerebral palsy   . Seizures   . Ventriculomegaly of brain, congenital   . Agenesis of corpus callosum   . Pneumonia   . Status epilepticus     Past Surgical History  Procedure Laterality Date  . Strabismus surgery      in [place]    Current Outpatient Prescriptions on File Prior to Visit  Medication Sig Dispense Refill  . KEPPRA 500 MG tablet TAKE 1 & 1/2 TABLETS TWICE DAILY AS DIRECTED 90 tablet 5  . KEPPRA 500 MG tablet Take 1+1/2 tablets in the morning and 1+1/2 tablets at night 90 tablet 5  . KEPPRA 500 MG tablet TAKE 1 & 1/2 TABLETS TWICE DAILY AS DIRECTED 90 tablet 5  . LAMICTAL 200 MG tablet TAKE 1 TABLET TWICE DAILY. 60 tablet 5  . LAMICTAL 25 MG tablet Take 1 tablet (25 mg total) by mouth 2 (two) times daily. 60 tablet 5  . polyethylene glycol powder (GLYCOLAX/MIRALAX) powder USE ONE TABLESPOON (17GM) DAILY. 527 g 5  . sodium phosphate Pediatric (FLEET) 3.5-9.5 GM/59ML enema Place 1 enema rectally every three (3) days as needed for constipation.    . triamcinolone cream (KENALOG) 0.1 % Apply 1 application topically 2 (two) times daily as needed (for eczema).     No current facility-administered medications on file prior to visit.    No Known Allergies  Social History   Social History  . Marital Status: Single    Spouse  Name: N/A  . Number of Children: N/A  . Years of Education: N/A   Social History Main Topics  . Smoking status: Never Smoker   . Smokeless tobacco: Never Used  . Alcohol Use: No  . Drug Use: No  . Sexual Activity: No   Other Topics Concern  . Not on file   Social History Narrative    Family History  Problem Relation Age of Onset  . Cancer Paternal Grandfather     Died at 60    ROS  Per HPI   Objective   BP 124/82 mmHg  Pulse 93  SpO2 100%  General: Well appearing, no distress HEENT: TMs not visualized secondary to non-cooperation. Conjunctiva clear with no discharge or redness. Oropharynx clear, moist. Nares patent and normal. Neck with small non-tender cervical adenopathy Respiratory/Chest: Clear to auscultation bilaterally, diminished throughout with poor effort Cardiovascular: Regular rate and rhythm Gastrointestinal: Soft, non-tender, non-distended, no organomegaly Genitourinary: Not examined    Musculoskeletal: Lower extremity muscle atrophy Neuro: Alert, increased muscle tone Dermatologic: Multiple skin tags on left side of abdomen  No orders of the defined types were placed in this encounter.    Assessment and Plan    Please refer to problem based charting of assessment and plan

## 2015-07-24 NOTE — Telephone Encounter (Signed)
Patient mother dropped off forms to be completed by MD at appointment today. Forms given to PCP.

## 2015-07-24 NOTE — Patient Instructions (Signed)
Thank you for coming to see me today. It was a pleasure. Today we talked about:   Physical: I have filled out your forms  Itchy eyes/nose: I am starting Zyrtec. Take  daily  Constipation: Please increase to one capful twice a day  Please make an appointment to see me in 1 year for follow-up.  If you have any questions or concerns, please do not hesitate to call the office at [phone].  Sincerely,  Jacquelin Hawking, MD

## 2015-07-25 ENCOUNTER — Encounter: Payer: Self-pay | Admitting: Family Medicine

## 2015-07-25 DIAGNOSIS — K5900 Constipation, unspecified: Secondary | ICD-10-CM | POA: Insufficient documentation

## 2015-07-25 DIAGNOSIS — R6889 Other general symptoms and signs: Secondary | ICD-10-CM | POA: Insufficient documentation

## 2015-07-25 DIAGNOSIS — H579 Unspecified disorder of eye and adnexa: Secondary | ICD-10-CM | POA: Insufficient documentation

## 2015-07-25 NOTE — Assessment & Plan Note (Signed)
Will trial on antihistamines  Zyrtec  daily

## 2015-07-25 NOTE — Telephone Encounter (Signed)
Patient mother informed that forms were ready to be picked up.

## 2015-07-25 NOTE — Assessment & Plan Note (Signed)
Increase Miralax to 2 capfuls daily until soft stools

## 2015-08-24 ENCOUNTER — Other Ambulatory Visit: Payer: Self-pay | Admitting: Family

## 2015-08-27 ENCOUNTER — Other Ambulatory Visit: Payer: Self-pay | Admitting: Family

## 2015-08-27 DIAGNOSIS — G40209 Localization-related (focal) (partial) symptomatic epilepsy and epileptic syndromes with complex partial seizures, not intractable, without status epilepticus: Secondary | ICD-10-CM

## 2015-08-27 NOTE — Telephone Encounter (Signed)
Prescription was sent with a 6 month refill.  Patient was seen in March with a one-year return visit.

## 2015-08-28 ENCOUNTER — Other Ambulatory Visit: Payer: Self-pay

## 2015-08-28 DIAGNOSIS — G40209 Localization-related (focal) (partial) symptomatic epilepsy and epileptic syndromes with complex partial seizures, not intractable, without status epilepticus: Secondary | ICD-10-CM

## 2015-08-28 MED ORDER — LAMICTAL 200 MG PO TABS: 200.0000 mg | ORAL_TABLET | Freq: Two times a day (BID) | ORAL | Status: DC

## 2015-08-28 MED ORDER — LAMICTAL 25 MG PO TABS: 25.0000 mg | ORAL_TABLET | Freq: Two times a day (BID) | ORAL | Status: DC

## 2015-08-28 NOTE — Telephone Encounter (Signed)
Previous Rx did not print. TG 

## 2015-08-28 NOTE — Addendum Note (Signed)
Addended by: Princella Ion on: 08/28/2015 12:13 PM   Modules accepted: Orders

## 2015-08-30 ENCOUNTER — Telehealth: Payer: Self-pay | Admitting: Family Medicine

## 2015-08-30 DIAGNOSIS — G809 Cerebral palsy, unspecified: Secondary | ICD-10-CM

## 2015-08-30 DIAGNOSIS — G808 Other cerebral palsy: Secondary | ICD-10-CM

## 2015-08-30 NOTE — Telephone Encounter (Signed)
LVM for pt father to call back to inform him of below. Lamonte SakaiZimmerman Rumple, April D, [place]MA

## 2015-08-30 NOTE — Telephone Encounter (Signed)
Referral placed.

## 2015-08-30 NOTE — Telephone Encounter (Signed)
Patient's Father asks for a referral for an appointment for physical theraphy. Please, follow up with Mr. Marylene LandStover at [phone].

## 2015-10-16 ENCOUNTER — Encounter: Payer: Self-pay | Admitting: Rehabilitation

## 2015-10-16 ENCOUNTER — Ambulatory Visit: Attending: Family Medicine | Admitting: Rehabilitation

## 2015-10-16 DIAGNOSIS — G808 Other cerebral palsy: Secondary | ICD-10-CM | POA: Diagnosis present

## 2015-10-16 NOTE — Therapy (Signed)
[facility] [address] [place], [place], [address] Phone: [phone]   Fax:  [phone]  Patient Details  Name: Carlos [name] MRN: [id number] Date of Birth: 05/12/1986 Referring Provider:  Narda BondsNettey, Ralph A, MD  Encounter Date: 10/16/2015   Pt seen this morning with mother and father.  Note that they are needing new equipment for safe pt handling and overall pt safety at home.  They are requesting new bath chair [employer]) as current bath chair is approx. 28 years old-parts are damaged and in disrepair. Pt has sit in tub and this chair will allow parents/caregivers to better assist pt safely with bathing tasks.  He is also requiring new extra padding for bed rails due to pt currently in larger bed with longer bed rails and is no longer is original hospital bed.  They are using pads made for hospital bed rails, but are modifying with use of own personal pillows to increase safety. Note that pt now in a queen size bed and hospital bed pads no longer fit.   Pad for floor (mat) for safety to prevent injury if fall from bed.  Also note that current floor mat in disrepair and he utilizes floor mat for ROM, stretching, brief changing, and play.  Please refer to formal LMN on specifics and note that both vendor and case manager notified of LMN.     Harriet ButteEmily Diallo Ponder, PT, MPT [facility] [address] [place], [place], [address] Phone: [phone]   Fax:  [phone] 10/16/2015, 10:42 AM

## 2015-10-17 ENCOUNTER — Ambulatory Visit (INDEPENDENT_AMBULATORY_CARE_PROVIDER_SITE_OTHER): Admitting: Family Medicine

## 2015-10-17 ENCOUNTER — Encounter: Payer: Self-pay | Admitting: Family Medicine

## 2015-10-17 VITALS — BP 131/85 | HR 127 | Temp 98.1°F

## 2015-10-17 DIAGNOSIS — E639 Nutritional deficiency, unspecified: Secondary | ICD-10-CM

## 2015-10-17 MED ORDER — PHENOL 1.4 % MT LIQD: 1.0000 | OROMUCOSAL | Status: DC | PRN

## 2015-10-17 NOTE — Patient Instructions (Signed)
Thank you for coming to see me today. It was a pleasure. Today we talked about:   Poor eating: We decided this may be related to [facility] having a sore throat. I will give him a spray to use. If symptoms worsen, or if he's not willing to increase fluid intake, please return promptly  If you have any questions or concerns, please do not hesitate to call the office at [phone].  Sincerely,  Jacquelin Hawkingalph Domnic Vantol, MD

## 2015-10-17 NOTE — Progress Notes (Signed)
    Subjective   Carlos [name] is a 29 y.o. male that presents for a same day visit  1. Not drinking/eating: Symptoms started three days ago. He has been having emesis with meals. He has been able to keep fluid down when he takes them. His Tmxax was 100 degrees farenheit. No associated rhinorrhea, sneezing or diarrhea. No sick contacts. Parents describe him having an "unfocussed look." He has had about 3 seizures in the last 24 hours.  ROS Per HPI  Social History  Substance Use Topics  . Smoking status: Never Smoker   . Smokeless tobacco: Never Used  . Alcohol Use: No    No Known Allergies  Objective   BP 131/85 mmHg  Pulse 127  Temp(Src) 98.1 F (36.7 C) (Axillary)  Ht   Wt   General: Well nourished, no distress, in a wheel chair HEENT:   Head:  Normocephalic  Eyes: Pupils equal and reactive to light. Extraocular movements intact bilaterally.  Ears: Tympanic membranes normal bilaterally.  Nose/Throat: Nares patent bilaterally. Oropharnx clear and moist.  Neck: No cervical adenopathy bilaterally Respiratory/Chest: Clear to auscultation bilaterally although poor effort given. No retractions. No wheezing  Assessment and Plan   Meds ordered this encounter  Medications  . phenol (CHLORASEPTIC) 1.4 % LIQD    Sig: Use as directed 1-2 sprays in the mouth or throat as needed for throat irritation / pain.    Dispense:  177 Bottle    Refill:  0    Poor eating: speaking with parents, possible that patient might have a sore throat. Nothing seen/heard on exam today. Patient without fever. Parents later clarified that "throwing up" was more like excess saliva. Recommended UA and CXR but parents declined. Preferred to treat for possible sore throat and follow-up if needed. Will provide chloraseptic spray. Return precautions discussed

## 2015-12-14 ENCOUNTER — Telehealth: Payer: Self-pay | Admitting: Family Medicine

## 2015-12-14 NOTE — Telephone Encounter (Signed)
Pt father dropping off form for authorization for medical supplies, specifically a bath chair. Please contact father when ready for pu. Sadie Reynolds, ASA

## 2015-12-17 ENCOUNTER — Other Ambulatory Visit: Payer: Self-pay | Admitting: Family Medicine

## 2015-12-18 NOTE — Telephone Encounter (Signed)
Form put in Dr. Atilano Median box. Sunday Spillers, CMA

## 2015-12-21 NOTE — Telephone Encounter (Signed)
Patient's father informed that bath chair form is complete and ready for pick up.  Clovis Pu, RN

## 2016-01-09 ENCOUNTER — Other Ambulatory Visit: Payer: Self-pay | Admitting: *Deleted

## 2016-01-09 MED ORDER — KEPPRA 500 MG PO TABS: ORAL_TABLET | ORAL | Status: DC

## 2016-01-30 ENCOUNTER — Ambulatory Visit (INDEPENDENT_AMBULATORY_CARE_PROVIDER_SITE_OTHER): Admitting: Family Medicine

## 2016-01-30 ENCOUNTER — Encounter: Payer: Self-pay | Admitting: Family Medicine

## 2016-01-30 VITALS — Temp 98.1°F

## 2016-01-30 DIAGNOSIS — G40319 Generalized idiopathic epilepsy and epileptic syndromes, intractable, without status epilepticus: Secondary | ICD-10-CM | POA: Diagnosis not present

## 2016-01-30 DIAGNOSIS — M24511 Contracture, right shoulder: Secondary | ICD-10-CM

## 2016-01-30 NOTE — Patient Instructions (Signed)
Thank you for coming in,   I think his shoulder enlargement is normal growth from this crawling.   Sign up for My Chart to have easy access to your labs results, and communication with your Primary care physician   Please feel free to call with any questions or concerns at any time, at [phone]. --Dr. Jordan LikesSchmitz

## 2016-01-30 NOTE — Progress Notes (Signed)
   Subjective:    Patient ID: Carlos [name], male    DOB: 08/18/1986, 30 y.o.   MRN: [id number]  Seen for Same day visit for   CC: right shoulder enlargment.   Mother reports that she noticed this about a week ago. . She is unsure if this was a recent event.  He doesn't seem to be in pain  They have not changed to any new equipment.  He sleeps with his right arm tucked under his body.  He will "army" crawl around the house.  She denies him getting shot or vaccine in his right shoulder.  Deny any trauma or injury to his shoulder.  Denies any fever or chills.  He use to go to [facility] office in order to receive botox injections but has not been for 2 years since his parents wanted to take a break from them.   PMH: cerebral palsy, congenital quadriplegia    Review of Systems   See HPI for ROS. Objective:  BP   Temp(Src) 98.1 F (36.7 C) (Oral)  Ht   Wt   General: NAD Extremities:  WWP. Skin: warm and dry, no rashes noted MSK: right shoulder looks hypertrophied compared to left.  No TTP along the clavicle, BT No Popeye deformity  Normal passive ROM  Normal strength  Flexion contractures at his elbows  Pulses intact distally    Assessment & Plan:   Contracture of right shoulder Most likely this is hypertrophy secondary to his crawling around and more than likely pulling himself with his right arm.  He had good range of motion with no pain elicited with movement.  There is no swelling or erythema.  Don't appreciate any muscle tear or tendon rupture  Quick ultrasound scan showed some fluid around the biceps tendon which could result from his contractures but I doubt it contributes to his shoulder enlargement.  - mother and father are requesting a referral to Dr. Kem KaysKoslaski, [facility]). Will discuss this with PCP   Generalized convulsive epilepsy with intractable epilepsy [facility](HCC) Mother and father are requesting a referral to dentist  at [facility] that can handle his seizure disorder  - will discuss this with PCP

## 2016-01-30 NOTE — Assessment & Plan Note (Signed)
Mother and father are requesting a referral to dentist at [facility] that can handle his seizure disorder  - will discuss this with PCP

## 2016-01-30 NOTE — Assessment & Plan Note (Signed)
Most likely this is hypertrophy secondary to his crawling around and more than likely pulling himself with his right arm.  He had good range of motion with no pain elicited with movement.  There is no swelling or erythema.  Don't appreciate any muscle tear or tendon rupture  Quick ultrasound scan showed some fluid around the biceps tendon which could result from his contractures but I doubt it contributes to his shoulder enlargement.  - mother and father are requesting a referral to Dr. Kem KaysKoslaski, [facility]). Will discuss this with PCP

## 2016-02-04 NOTE — Addendum Note (Signed)
Addended by: Myra RudeSCHMITZ, Dewaun Kinzler E on: 02/04/2016 02:09 PM   Modules accepted: Orders

## 2016-02-05 ENCOUNTER — Emergency Department (HOSPITAL_COMMUNITY): Admission: EM | Admit: 2016-02-05 | Discharge: 2016-02-05 | Disposition: A | Discharge status: Home / Self Care

## 2016-02-05 ENCOUNTER — Telehealth: Payer: Self-pay | Admitting: Family Medicine

## 2016-02-05 ENCOUNTER — Ambulatory Visit (INDEPENDENT_AMBULATORY_CARE_PROVIDER_SITE_OTHER): Admitting: Family Medicine

## 2016-02-05 ENCOUNTER — Other Ambulatory Visit: Payer: Self-pay | Admitting: Family Medicine

## 2016-02-05 ENCOUNTER — Encounter: Payer: Self-pay | Admitting: Family Medicine

## 2016-02-05 ENCOUNTER — Ambulatory Visit (HOSPITAL_COMMUNITY): Admission: RE | Admit: 2016-02-05 | Source: Ambulatory Visit

## 2016-02-05 VITALS — BP 132/90 | HR 111 | Temp 98.3°F | Wt 100.0 lb

## 2016-02-05 DIAGNOSIS — M79605 Pain in left leg: Secondary | ICD-10-CM

## 2016-02-05 DIAGNOSIS — M25552 Pain in left hip: Secondary | ICD-10-CM

## 2016-02-05 DIAGNOSIS — M24552 Contracture, left hip: Secondary | ICD-10-CM

## 2016-02-05 DIAGNOSIS — R000 Tachycardia, unspecified: Secondary | ICD-10-CM

## 2016-02-05 DIAGNOSIS — M25452 Effusion, left hip: Secondary | ICD-10-CM | POA: Diagnosis not present

## 2016-02-05 DIAGNOSIS — S73002A Unspecified subluxation of left hip, initial encounter: Secondary | ICD-10-CM

## 2016-02-05 DIAGNOSIS — M25459 Effusion, unspecified hip: Secondary | ICD-10-CM | POA: Insufficient documentation

## 2016-02-05 DIAGNOSIS — S72002A Fracture of unspecified part of neck of left femur, initial encounter for closed fracture: Secondary | ICD-10-CM

## 2016-02-05 HISTORY — DX: Pain in left hip: M25.552

## 2016-02-05 HISTORY — DX: Effusion, unspecified hip: M25.459

## 2016-02-05 MED ORDER — LORAZEPAM 1 MG PO TABS: ORAL_TABLET | ORAL | Status: AC

## 2016-02-05 NOTE — Addendum Note (Signed)
Addended by: Janit PaganENIOLA, KEHINDE T on: 02/05/2016 06:04 PM   Modules accepted: Orders

## 2016-02-05 NOTE — Patient Instructions (Signed)
It was nice meeting Carlos [name] today. I am sorry he is having pain and swelling of his left thigh. I will like for Carlos [name] to rule out blood clot of his thigh and also obtain and xray to assess his hip pain. I will contact you with result. If xray and Ultrasounds are negative I will refer him to PT to management. In the mean time continue tylenol as needed for pain.

## 2016-02-05 NOTE — Assessment & Plan Note (Signed)
Likely due to muscle strain. However r/o DVT due to immobility. Duplex U/S of his left thigh ordered. I will contact his parent with result. Tylenol as needed for pain in the mean time.

## 2016-02-05 NOTE — Telephone Encounter (Addendum)
I called mom. She stated she could not wait any longer in the ED so [facility]. We will try to reschedule test. As discussed with her if symptoms worsens she should taking him to the ED. Mom also requested orthopedic referral. Which she had requested in the past. I think this is reasonable. I will put in referral and also work on rescheduling imaging. Referral to PT done as well. Ativan called in to use prior to procedure.  Note: Index of suspicion for DVT is low.

## 2016-02-05 NOTE — Telephone Encounter (Signed)
I got a call from radiology that his U/S is difficult to obtain. I will need sedation. The plan is to send him to the ED to get sedated and then he will get U/S done. I called ED and discussed with charge nurse who will get ED physician to order sedation for U/S and xray. I will follow up with report.

## 2016-02-05 NOTE — ED Notes (Signed)
pts family has decided to take pt home and they will call their doctor tomorrow.

## 2016-02-05 NOTE — Addendum Note (Signed)
Addended by: Janit PaganENIOLA, Michaelangelo Mittelman T on: 02/05/2016 06:14 PM   Modules accepted: Orders

## 2016-02-05 NOTE — ED Notes (Signed)
Received call from Dr Mauricio PoNeiola that pt was being sent to the ED for an US of extremities. Pt will need sedation for US.

## 2016-02-05 NOTE — Assessment & Plan Note (Signed)
Likely due to pain. Per CMA he was screaming in pain while measuring his vital signs. CMA instructed to recheck prior to d/c home. Patient otherwise stable.

## 2016-02-05 NOTE — Progress Notes (Signed)
Subjective:     Patient ID: Carlos [name], male   DOB: 07/04/1986, 30 y.o.   MRN: [id number]  Leg Pain  The incident occurred more than 1 week ago (Brought in by parent for left leg pain which started about 1 week ago). The incident occurred at home. There was no injury mechanism. The pain is present in the left leg. Pain scale: unable to qualify/quantify. The pain is moderate. The pain has been intermittent since onset. Pertinent negatives include no loss of sensation or numbness. Associated symptoms comments: Worse when parent tries to change his diaper. There is some swelling of his left thigh which was noticed few days ago but no redness.. The symptoms are aggravated by movement and palpation. He has tried acetaminophen for the symptoms. The treatment provided moderate relief.    Current Outpatient Prescriptions on File Prior to Visit  Medication Sig Dispense Refill  . KEPPRA 500 MG tablet TAKE 1 & 1/2 TABLETS TWICE DAILY AS DIRECTED 90 tablet 1  . LAMICTAL 200 MG tablet Take 1 tablet (200 mg total) by mouth 2 (two) times daily. 60 tablet 5  . LAMICTAL 25 MG tablet Take 1 tablet (25 mg total) by mouth 2 (two) times daily. 60 tablet 5  . polyethylene glycol powder (GLYCOLAX/MIRALAX) powder USE ONE CAPFUL (17GM) TWICE DAILY 527 g 5  . cetirizine HCl (ZYRTEC) 5 MG/5ML SYRP Take 10 mLs (10 mg total) by mouth daily. 1 Bottle 5  . phenol (CHLORASEPTIC) 1.4 % LIQD Use as directed 1-2 sprays in the mouth or throat as needed for throat irritation / pain. 177 Bottle 0  . sodium phosphate Pediatric (FLEET) 3.5-9.5 GM/59ML enema Place 1 enema rectally every three (3) days as needed for constipation.    . triamcinolone cream (KENALOG) 0.1 % APPLY TWICE DAILY AS NEEDED. 30 g 0   No current facility-administered medications on file prior to visit.   Past Medical History  Diagnosis Date  . Groin abscess 03/02/2008  . Cerebral palsy (HCC)   . Seizures (HCC)   . Ventriculomegaly of brain, congenital (HCC)    . Agenesis of corpus callosum (HCC)   . Pneumonia   . Status epilepticus (HCC)       Review of Systems  Respiratory: Negative.   Cardiovascular: Negative.   Gastrointestinal: Negative.   Musculoskeletal: Positive for arthralgias. Negative for joint swelling.       Left hip and thigh pain. Thigh swelling  Neurological: Negative for numbness.  All other systems reviewed and are negative.  Filed Vitals:   02/05/16 1059  BP: 132/90  Pulse: 111  Temp: 98.3 F (36.8 C)  TempSrc: Axillary  Weight: 100 lb (45.36 kg)       Objective:   Physical Exam  Constitutional: He appears well-developed. No distress.  Cardiovascular: Normal rate, regular rhythm, normal heart sounds and intact distal pulses.   No murmur heard. Pulmonary/Chest: Effort normal and breath sounds normal. No respiratory distress. He has no wheezes.  Abdominal: Soft. Bowel sounds are normal. He exhibits no distension. There is no tenderness.  Musculoskeletal:  Lower extremity contracture. Left hip joint ROM limited by pain and contracture at baseline. Left inner thigh slightly swollen compared to the right and is tender to touch. However no erythema or warmth.  Nursing note and vitals reviewed.      Assessment:     Left hip pain Left thigh swelling  Tachycardia    Plan:     Check problem list.

## 2016-02-05 NOTE — Assessment & Plan Note (Signed)
Etiology unclear. May be due to chronic lower limb contracture and spasm. Xray ordered to further assess his pain. Continue Tylenol as needed. If xray is normal I will refer to PT for further management.

## 2016-02-06 NOTE — Telephone Encounter (Signed)
Mom contacted this morning and informed about Ativan prescription. I also informed her about orthopedic and PT referral. I recommended that he obtain the imaging before going for PT so we can be sure there is no hip joint dislocation of some sort. His mom verbalized understanding.   [facility] blue team nursing staff. Please contact mom to reschedule U/S and xray. Thanks.

## 2016-02-06 NOTE — Telephone Encounter (Signed)
Spoke with Carlos SorrowJerry in cardiovascular ultrasound at the hospital and patient is scheduled for 02-07-16 at 11am.  Father is aware of this and can get xray before or after this procedure.  Robertha Staples,CMA

## 2016-02-07 ENCOUNTER — Ambulatory Visit (HOSPITAL_COMMUNITY)
Admission: RE | Admit: 2016-02-07 | Discharge: 2016-02-07 | Disposition: A | Discharge status: Home / Self Care | Source: Ambulatory Visit | Attending: Family Medicine | Admitting: Family Medicine

## 2016-02-07 DIAGNOSIS — S73002A Unspecified subluxation of left hip, initial encounter: Secondary | ICD-10-CM | POA: Diagnosis not present

## 2016-02-07 DIAGNOSIS — M79605 Pain in left leg: Secondary | ICD-10-CM

## 2016-02-07 DIAGNOSIS — X58XXXA Exposure to other specified factors, initial encounter: Secondary | ICD-10-CM | POA: Insufficient documentation

## 2016-02-07 DIAGNOSIS — M25552 Pain in left hip: Secondary | ICD-10-CM

## 2016-02-07 DIAGNOSIS — M8588 Other specified disorders of bone density and structure, other site: Secondary | ICD-10-CM | POA: Insufficient documentation

## 2016-02-07 NOTE — Progress Notes (Signed)
*  Preliminary Results* Left lower extremity venous duplex completed. Left lower extremity is negative for deep vein thrombosis. There is no evidence of left Baker's cyst.  02/07/2016 11:43 AM  Gertie FeyMichelle Lakira Ogando, RVT, RDCS, RDMS

## 2016-02-07 NOTE — Telephone Encounter (Addendum)
I contacted mom to discuss result. U/S negative. Xray report below As discussed with mom, no PT for now but I did place referral to orthopedic. He will benefit from MRI but I will defer to orthopedic. Continue Tylenol as needed for pain.\  PT referral cancelled because of possible avulsion fracture on xray.   Dg Hip Unilat W Or W/o Pelvis 2-3 Views Left  02/07/2016  CLINICAL DATA:  History of cerebral palsy, left hip pain . EXAM: DG HIP (WITH OR WITHOUT PELVIS) 2-3V LEFT COMPARISON:  None. FINDINGS: There is partial subluxation of the left femoral head laterally. In addition there is a bony density along the superior acetabulum laterally. This could represent a subacute fracture of the left superior acetabular margin although it also could represent an unfused ossification center i.e. the os acetabuli marginalis superior. The right hip is normally positioned with normal hip joint space. The pelvic rami appear intact. IMPRESSION: 1. Partial subluxation of the left femoral head of uncertain age. 2. Bony density from the left superior acetabular rim could represent a subacute avulsion fracture although I would favor an unfused secondary ossification center as noted above. Electronically Signed   By: Dwyane DeePaul  Barry M.D.   On: 02/07/2016 15:07

## 2016-02-07 NOTE — Addendum Note (Signed)
Addended by: Janit PaganENIOLA, Kaleia Longhi T on: 02/07/2016 03:24 PM   Modules accepted: Orders

## 2016-02-11 ENCOUNTER — Telehealth: Payer: Self-pay

## 2016-02-11 DIAGNOSIS — G40209 Localization-related (focal) (partial) symptomatic epilepsy and epileptic syndromes with complex partial seizures, not intractable, without status epilepticus: Secondary | ICD-10-CM

## 2016-02-11 MED ORDER — LAMICTAL 25 MG PO TABS: 25.0000 mg | ORAL_TABLET | Freq: Two times a day (BID) | ORAL | Status: DC

## 2016-02-11 MED ORDER — KEPPRA 500 MG PO TABS: ORAL_TABLET | ORAL | Status: DC

## 2016-02-11 MED ORDER — LAMICTAL 200 MG PO TABS: 200.0000 mg | ORAL_TABLET | Freq: Two times a day (BID) | ORAL | Status: DC

## 2016-02-11 NOTE — Telephone Encounter (Signed)
Aggie Cosierheresa, mom, called and lvm stating that she needed a call back to discuss child's medication. I called mom and she said that he does not have any refills left on his medications and does not have an appointment until 02-25-16. I let mom know that we will send a refill on each medication, which should be enough to get him through until he is seen in our office. She expressed understanding.

## 2016-02-11 NOTE — Telephone Encounter (Signed)
Rx's sent as requested. TG 

## 2016-02-13 ENCOUNTER — Telehealth: Payer: Self-pay | Admitting: Family Medicine

## 2016-02-13 NOTE — Telephone Encounter (Signed)
Mother would like to dr Lum Babeeniola about the referral to an orthopaedic.  It was the mothers understanding pt would [place]. She is aware of the appt on April 24 but is wanting something sooner.  Please advise

## 2016-02-14 NOTE — Telephone Encounter (Signed)
LM for mother to call back regarding referral.  Breniya Goertzen,CMA

## 2016-02-14 NOTE — Telephone Encounter (Signed)
Will forward to referral coordinator to inquire about location.  Jazmin Hartsell,CMA

## 2016-02-14 NOTE — Telephone Encounter (Signed)
In the referral placed by Dr. Jordan LikesSchmitz on 02/04/16, he specifically requested patient to be scheduled with Dr. Kennon [facility]elaKolaski at [facility] who had previously followed pt and that is where the appt was made. I spoke with mom after I made this appt and she seemed fine [facility]. [facility]. Address given to mother when she was advised of the appt.

## 2016-02-25 ENCOUNTER — Ambulatory Visit (INDEPENDENT_AMBULATORY_CARE_PROVIDER_SITE_OTHER): Admitting: Family

## 2016-02-25 ENCOUNTER — Encounter: Payer: Self-pay | Admitting: Family

## 2016-02-25 VITALS — BP 110/70 | HR 84

## 2016-02-25 DIAGNOSIS — G40209 Localization-related (focal) (partial) symptomatic epilepsy and epileptic syndromes with complex partial seizures, not intractable, without status epilepticus: Secondary | ICD-10-CM

## 2016-02-25 DIAGNOSIS — M25552 Pain in left hip: Secondary | ICD-10-CM | POA: Diagnosis not present

## 2016-02-25 DIAGNOSIS — F819 Developmental disorder of scholastic skills, unspecified: Secondary | ICD-10-CM | POA: Diagnosis not present

## 2016-02-25 DIAGNOSIS — G808 Other cerebral palsy: Secondary | ICD-10-CM | POA: Diagnosis not present

## 2016-02-25 DIAGNOSIS — G40219 Localization-related (focal) (partial) symptomatic epilepsy and epileptic syndromes with complex partial seizures, intractable, without status epilepticus: Secondary | ICD-10-CM | POA: Diagnosis not present

## 2016-02-25 DIAGNOSIS — G40319 Generalized idiopathic epilepsy and epileptic syndromes, intractable, without status epilepticus: Secondary | ICD-10-CM | POA: Diagnosis not present

## 2016-02-25 MED ORDER — LAMICTAL 25 MG PO TABS: 25.0000 mg | ORAL_TABLET | Freq: Two times a day (BID) | ORAL | Status: DC

## 2016-02-25 MED ORDER — KEPPRA 500 MG PO TABS: ORAL_TABLET | ORAL | Status: DC

## 2016-02-25 MED ORDER — CETIRIZINE HCL 5 MG/5ML PO SYRP: 10.0000 mg | ORAL_SOLUTION | Freq: Every day | ORAL | Status: DC

## 2016-02-25 MED ORDER — LAMICTAL 200 MG PO TABS: 200.0000 mg | ORAL_TABLET | Freq: Two times a day (BID) | ORAL | Status: DC
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
## 2016-02-25 NOTE — Patient Instructions (Signed)
Thank you for coming in today.   Continue Carlos [name]'s medications has you have been giving them. Let me know if his seizures become more frequent or more severe.  Please consider signing up for MyChart for Carlos [name]  Please plan to return for follow up in one year or sooner if needed.

## 2016-03-10 ENCOUNTER — Other Ambulatory Visit: Payer: Self-pay | Admitting: Family Medicine

## 2016-04-23 ENCOUNTER — Inpatient Hospital Stay (HOSPITAL_COMMUNITY)

## 2016-04-23 ENCOUNTER — Emergency Department (HOSPITAL_COMMUNITY)

## 2016-04-23 ENCOUNTER — Inpatient Hospital Stay (HOSPITAL_COMMUNITY)
Admission: EM | Admit: 2016-04-23 | Discharge: 2016-04-26 | DRG: 100 | Disposition: A | Discharge status: Home / Self Care | Attending: Family Medicine | Admitting: Family Medicine

## 2016-04-23 ENCOUNTER — Encounter (HOSPITAL_COMMUNITY): Payer: Self-pay | Admitting: *Deleted

## 2016-04-23 DIAGNOSIS — N3946 Mixed incontinence: Secondary | ICD-10-CM | POA: Diagnosis present

## 2016-04-23 DIAGNOSIS — R509 Fever, unspecified: Secondary | ICD-10-CM

## 2016-04-23 DIAGNOSIS — Q040 Congenital malformations of corpus callosum: Secondary | ICD-10-CM

## 2016-04-23 DIAGNOSIS — F819 Developmental disorder of scholastic skills, unspecified: Secondary | ICD-10-CM | POA: Diagnosis present

## 2016-04-23 DIAGNOSIS — R569 Unspecified convulsions: Secondary | ICD-10-CM | POA: Diagnosis present

## 2016-04-23 DIAGNOSIS — F72 Severe intellectual disabilities: Secondary | ICD-10-CM | POA: Diagnosis present

## 2016-04-23 DIAGNOSIS — G808 Other cerebral palsy: Secondary | ICD-10-CM | POA: Diagnosis present

## 2016-04-23 DIAGNOSIS — R000 Tachycardia, unspecified: Secondary | ICD-10-CM | POA: Diagnosis present

## 2016-04-23 DIAGNOSIS — K5900 Constipation, unspecified: Secondary | ICD-10-CM | POA: Diagnosis present

## 2016-04-23 DIAGNOSIS — H02402 Unspecified ptosis of left eyelid: Secondary | ICD-10-CM | POA: Diagnosis not present

## 2016-04-23 DIAGNOSIS — G40319 Generalized idiopathic epilepsy and epileptic syndromes, intractable, without status epilepticus: Secondary | ICD-10-CM | POA: Diagnosis present

## 2016-04-23 DIAGNOSIS — G809 Cerebral palsy, unspecified: Secondary | ICD-10-CM

## 2016-04-23 DIAGNOSIS — G40209 Localization-related (focal) (partial) symptomatic epilepsy and epileptic syndromes with complex partial seizures, not intractable, without status epilepticus: Principal | ICD-10-CM | POA: Diagnosis present

## 2016-04-23 DIAGNOSIS — M419 Scoliosis, unspecified: Secondary | ICD-10-CM | POA: Diagnosis not present

## 2016-04-23 DIAGNOSIS — G40919 Epilepsy, unspecified, intractable, without status epilepticus: Secondary | ICD-10-CM | POA: Diagnosis not present

## 2016-04-23 DIAGNOSIS — Z79899 Other long term (current) drug therapy: Secondary | ICD-10-CM

## 2016-04-23 DIAGNOSIS — R1310 Dysphagia, unspecified: Secondary | ICD-10-CM | POA: Diagnosis present

## 2016-04-23 DIAGNOSIS — E876 Hypokalemia: Secondary | ICD-10-CM | POA: Diagnosis not present

## 2016-04-23 LAB — COMPREHENSIVE METABOLIC PANEL
ALT: 10 U/L — ABNORMAL LOW (ref 17–63)
ANION GAP: 17 — AB (ref 5–15)
AST: 26 U/L (ref 15–41)
Albumin: 4.1 g/dL (ref 3.5–5.0)
Alkaline Phosphatase: 71 U/L (ref 38–126)
BILIRUBIN TOTAL: 0.5 mg/dL (ref 0.3–1.2)
BUN: 8 mg/dL (ref 6–20)
CHLORIDE: 104 mmol/L (ref 101–111)
CO2: 17 mmol/L — ABNORMAL LOW (ref 22–32)
Calcium: 10 mg/dL (ref 8.9–10.3)
Creatinine, Ser: 1.05 mg/dL (ref 0.61–1.24)
Glucose, Bld: 115 mg/dL — ABNORMAL HIGH (ref 65–99)
POTASSIUM: 4.2 mmol/L (ref 3.5–5.1)
Sodium: 138 mmol/L (ref 135–145)
TOTAL PROTEIN: 8.1 g/dL (ref 6.5–8.1)

## 2016-04-23 LAB — URINE MICROSCOPIC-ADD ON

## 2016-04-23 LAB — CBC WITH DIFFERENTIAL/PLATELET
BASOS ABS: 0 10*3/uL (ref 0.0–0.1)
Basophils Relative: 0 %
EOS PCT: 0 %
Eosinophils Absolute: 0 10*3/uL (ref 0.0–0.7)
HCT: 42.3 % (ref 39.0–52.0)
Hemoglobin: 13.4 g/dL (ref 13.0–17.0)
LYMPHS PCT: 7 %
Lymphs Abs: 1 10*3/uL (ref 0.7–4.0)
MCH: 24.4 pg — AB (ref 26.0–34.0)
MCHC: 31.7 g/dL (ref 30.0–36.0)
MCV: 76.9 fL — AB (ref 78.0–100.0)
MONO ABS: 0.5 10*3/uL (ref 0.1–1.0)
MONOS PCT: 4 %
Neutro Abs: 12.4 10*3/uL — ABNORMAL HIGH (ref 1.7–7.7)
Neutrophils Relative %: 89 %
PLATELETS: 293 10*3/uL (ref 150–400)
RBC: 5.5 MIL/uL (ref 4.22–5.81)
RDW: 14.1 % (ref 11.5–15.5)
WBC: 13.9 10*3/uL — ABNORMAL HIGH (ref 4.0–10.5)

## 2016-04-23 LAB — MRSA PCR SCREENING: MRSA BY PCR: NEGATIVE

## 2016-04-23 LAB — URINALYSIS, ROUTINE W REFLEX MICROSCOPIC
BILIRUBIN URINE: NEGATIVE
Glucose, UA: NEGATIVE mg/dL
Hgb urine dipstick: NEGATIVE
Ketones, ur: 15 mg/dL — AB
LEUKOCYTES UA: NEGATIVE
NITRITE: NEGATIVE
Protein, ur: 30 mg/dL — AB
SPECIFIC GRAVITY, URINE: 1.027 (ref 1.005–1.030)
pH: 5 (ref 5.0–8.0)

## 2016-04-23 LAB — LACTIC ACID, PLASMA
LACTIC ACID, VENOUS: 1.3 mmol/L (ref 0.5–2.0)
Lactic Acid, Venous: 1.4 mmol/L (ref 0.5–2.0)

## 2016-04-23 LAB — CBG MONITORING, ED: GLUCOSE-CAPILLARY: 117 mg/dL — AB (ref 65–99)

## 2016-04-23 LAB — CK: CK TOTAL: 336 U/L (ref 49–397)

## 2016-04-23 LAB — MAGNESIUM: Magnesium: 1.6 mg/dL — ABNORMAL LOW (ref 1.7–2.4)

## 2016-04-23 MED ORDER — POLYETHYLENE GLYCOL 3350 17 G PO PACK
17.0000 g | PACK | Freq: Two times a day (BID) | ORAL | Status: DC
Start: 2016-04-23 — End: 2016-04-26
  Administered 2016-04-23 – 2016-04-26 (×3): 17 g via ORAL
  Filled 2016-04-23 (×4): qty 1

## 2016-04-23 MED ORDER — SODIUM CHLORIDE 0.9 % IV SOLN
INTRAVENOUS | Status: DC
  Administered 2016-04-23 – 2016-04-25 (×6): via INTRAVENOUS

## 2016-04-23 MED ORDER — ACETAMINOPHEN 160 MG/5ML PO SOLN: 500.0000 mg | Freq: Four times a day (QID) | ORAL | Status: DC | PRN

## 2016-04-23 MED ORDER — LORAZEPAM 2 MG/ML IJ SOLN
1.0000 mg | Freq: Once | INTRAMUSCULAR | Status: AC | PRN
  Administered 2016-04-23 – 2016-04-24 (×2): 1 mg via INTRAVENOUS
  Filled 2016-04-23: qty 1

## 2016-04-23 MED ORDER — SODIUM CHLORIDE 0.9 % IV SOLN
500.0000 mg | INTRAVENOUS | Status: AC
  Administered 2016-04-23: 500 mg via INTRAVENOUS
  Filled 2016-04-23: qty 5

## 2016-04-23 MED ORDER — SODIUM CHLORIDE 0.9% FLUSH
3.0000 mL | Freq: Two times a day (BID) | INTRAVENOUS | Status: DC
  Administered 2016-04-23 – 2016-04-25 (×6): 3 mL via INTRAVENOUS

## 2016-04-23 MED ORDER — SODIUM CHLORIDE 0.9 % IV SOLN
1000.0000 mg | Freq: Once | INTRAVENOUS | Status: AC
  Administered 2016-04-23: 1000 mg via INTRAVENOUS
  Filled 2016-04-23: qty 10

## 2016-04-23 MED ORDER — LORAZEPAM 2 MG/ML IJ SOLN
1.0000 mg | Freq: Once | INTRAMUSCULAR | Status: AC
  Administered 2016-04-23: 1 mg via INTRAVENOUS
  Filled 2016-04-23: qty 1

## 2016-04-23 MED ORDER — POLYETHYLENE GLYCOL 3350 17 GM/SCOOP PO POWD
17.0000 g | Freq: Two times a day (BID) | ORAL | Status: DC
  Filled 2016-04-23: qty 255

## 2016-04-23 MED ORDER — ENOXAPARIN SODIUM 40 MG/0.4ML ~~LOC~~ SOLN
40.0000 mg | SUBCUTANEOUS | Status: DC
  Administered 2016-04-23 – 2016-04-26 (×4): 40 mg via SUBCUTANEOUS
  Filled 2016-04-23 (×4): qty 0.4

## 2016-04-23 MED ORDER — ONDANSETRON HCL 4 MG/2ML IJ SOLN
4.0000 mg | Freq: Four times a day (QID) | INTRAMUSCULAR | Status: DC | PRN
Start: 2016-04-23 — End: 2016-04-26

## 2016-04-23 MED ORDER — SODIUM CHLORIDE 0.9 % IV BOLUS (SEPSIS)
1000.0000 mL | Freq: Once | INTRAVENOUS | Status: AC
  Administered 2016-04-23: 1000 mL via INTRAVENOUS

## 2016-04-23 MED ORDER — LORAZEPAM 2 MG/ML IJ SOLN
INTRAMUSCULAR | Status: AC
  Administered 2016-04-23: 1 mg
  Filled 2016-04-23: qty 1

## 2016-04-23 MED ORDER — ONDANSETRON HCL 4 MG PO TABS: 4.0000 mg | ORAL_TABLET | Freq: Four times a day (QID) | ORAL | Status: DC | PRN

## 2016-04-23 MED ORDER — LAMOTRIGINE 25 MG PO TABS
225.0000 mg | ORAL_TABLET | Freq: Two times a day (BID) | ORAL | Status: DC
  Administered 2016-04-23 – 2016-04-26 (×4): 225 mg via ORAL
  Filled 2016-04-23 (×7): qty 1

## 2016-04-23 MED ORDER — SODIUM CHLORIDE 0.9 % IV SOLN
750.0000 mg | Freq: Two times a day (BID) | INTRAVENOUS | Status: DC
  Administered 2016-04-23 (×2): 750 mg via INTRAVENOUS
  Filled 2016-04-23 (×3): qty 7.5

## 2016-04-23 NOTE — ED Provider Notes (Signed)
By signing my name below, I, Carlos [name], attest that this documentation has been prepared under the direction and in the presence of Enbridge EnergyKristen N Ward, DO. Electronically Signed: Evon Slackerrance [name], ED Scribe. 04/23/2016. 3:09 AM.  TIME SEEN: 3:09 AM   CHIEF COMPLAINT: Seizures   HPI: HPI Comments: Level 5 Caveat due to pts non verbal status Carlos [name] is a 30 y.o. male who presents to the Emergency Department complaining of worsening grand mal seizures onset 1 day prior. Family states that he has had about 10 seizures in the last 24 hours. Father states that he has had his seizure medications with no relief. Family states that have also tried to keep him hydrated with no relief. Family states that he is on Keppra and Lamictal. Family states that he has not returned to baseline since the onset of seizures. Family states that his seizures are usually well controlled. Denies head injury, fever, vomiting or cough. His typical seizures are tonic-clonic. They state his last seizure was over a year ago. He sees Dr. Sharene SkeansHickling and his PCP is with [name].   ROS:  Level V caveat secondary to patient's nonverbal status   PAST MEDICAL HISTORY/PAST SURGICAL HISTORY:  Past Medical History  Diagnosis Date  . Groin abscess 03/02/2008  . Cerebral palsy (HCC)   . Seizures (HCC)   . Ventriculomegaly of brain, congenital (HCC)   . Agenesis of corpus callosum (HCC)   . Pneumonia   . Status epilepticus (HCC)     MEDICATIONS:  Prior to Admission medications   Medication Sig Start Date End Date Taking? Authorizing Provider  cetirizine HCl (ZYRTEC) 5 MG/5ML SYRP Take 10 mLs (10 mg total) by mouth daily. 02/25/16   Elveria Risingina Goodpasture, NP  KEPPRA 500 MG tablet TAKE 1 & 1/2 TABLETS TWICE DAILY AS DIRECTED 02/25/16   Elveria Risingina Goodpasture, NP  LAMICTAL 200 MG tablet Take 1 tablet (200 mg total) by mouth 2 (two) times daily. 02/25/16   Elveria Risingina Goodpasture, NP  LAMICTAL 25 MG tablet Take 1 tablet (25 mg  total) by mouth 2 (two) times daily. 02/25/16   Elveria Risingina Goodpasture, NP  phenol (CHLORASEPTIC) 1.4 % LIQD Use as directed 1-2 sprays in the mouth or throat as needed for throat irritation / pain. 10/17/15   Narda Bondsalph A Nettey, MD  polyethylene glycol powder (GLYCOLAX/MIRALAX) powder DISSOLVE ONE CAPFUL IN 8 0Z. WATER TWICE DAILY 03/10/16   Narda Bondsalph A Nettey, MD  sodium phosphate Pediatric (FLEET) 3.5-9.5 GM/59ML enema Place 1 enema rectally every three (3) days as needed for constipation.    Historical Provider, MD  triamcinolone cream (KENALOG) 0.1 % APPLY TWICE DAILY AS NEEDED. 12/17/15   Narda Bondsalph A Nettey, MD    ALLERGIES:  No Known Allergies  SOCIAL HISTORY:  Social History  Substance Use Topics  . Smoking status: Passive Smoke Exposure - Never Smoker  . Smokeless tobacco: Never Used  . Alcohol Use: No    FAMILY HISTORY: Family History  Problem Relation Age of Onset  . Cancer Paternal Grandfather     Died at 1549    EXAM: BP 124/77 mmHg  Pulse 107  Temp(Src) 98.8 F (37.1 C) (Axillary)  Resp 18  SpO2 98%   CONSTITUTIONAL: Alert, chronically ill; does no answer questions or follow commands.  HEAD: Normocephalic EYES: Conjunctivae clear, PERRL ENT: normal nose; no rhinorrhea; moist mucous membranes NECK: Supple, no meningismus, no LAD  CARD: Regular and tachycardic ; S1 and S2 appreciated; no murmurs, no clicks, no rubs, no  gallops RESP: Normal chest excursion without splinting or tachypnea; breath sounds clear and equal bilaterally; no wheezes, no rhonchi, no rales, no hypoxia or respiratory distress, speaking full sentences ABD/GI: Normal bowel sounds; non-distended; soft, non-tender, no rebound, no guarding, no peritoneal signs BACK:  The back appears normal and is non-tender to palpation, there is no CVA tenderness EXT: Contractors consistent with cerebral palsy worse in the LUE, No bony deformity or signs of trauma appreciated SKIN: Normal color for age and race; warm; no rash NEURO:  Moves all four extremities spontaneous appear agitated and restless does not answer questions or follow commands      EKG Interpretation  Date/Time:  Wednesday April 23 2016 03:01:20 EDT Ventricular Rate:  117 PR Interval:  141 QRS Duration: 89 QT Interval:  315 QTC Calculation: 439 R Axis:   58 Text Interpretation:  Sinus tachycardia Probable left atrial enlargement No old tracing to compare Confirmed by WARD,  DO, Carlos [phone]) on 04/23/2016 3:14:30 AM        EKG Interpretation  Date/Time:  Wednesday April 23 2016 06:05:15 EDT Ventricular Rate:  140 PR Interval:  127 QRS Duration: 83 QT Interval:  296 QTC Calculation: 452 R Axis:   44 Text Interpretation:  Sinus tachycardia Low voltage, extremity leads No significant change since last tracing Confirmed by WARD,  DO, Carlos ([id number]) on 04/23/2016 6:17:31 AM         MEDICAL DECISION MAKING: Patient here with 10 tonic-clonic seizures today over the course of 24 hours. Did have seizure-like activity prior to me entering the room. We'll give Ativan, loaded with IV Keppra. Nonverbal at baseline. Has contractures of the extremities at baseline. Appears to be moving everything equally. Is tachycardic and appears slightly agitated. No recent infectious symptoms. No sign of trauma. Will obtain head CT, x-ray of his chest and abdomen (father reports chronic constipation with last bowel movement being yesterday), labs, urine.  ED PROGRESS: 6:40 AM  Patient's workup has been unremarkable. He continues to become intermittently agitated and this improved with IV Ativan. Family reports this is not his typical seizure activity. No seizure activity witnessed since the first seizure in the ED. Blood glucose has been normal. Urine shows no infection. No bowel obstruction. No pneumonia. He is afebrile. Discussed with Dr. Roseanne Reno with neurology. They will see the patient in consult. This time he did not add any further recommendations. We'll discuss with  [facility] for admission.   7:05 AM  D/w Family medicine resident. He agrees with admission to step down, inpatient. I will place holding orders per their request. Attending physician is Dr. Burnard Leigh.  Patient is now more calm, heart rate in the low 100s. Updated patient's family. Care transferred to family medicine service.       I personally performed the services described in this documentation, which was scribed in my presence. The recorded information has been reviewed and is accurate.     Layla Maw Ward, DO 04/23/16 0710

## 2016-04-23 NOTE — Procedures (Signed)
History: 30 year old male with a history of seizures and severe intellectual disability presenting with increased seizure frequency  Sedation: He received Ativan earlier in the morning  Technique: This is a 21 channel routine scalp EEG performed at the bedside with bipolar and monopolar montages arranged in accordance to the international 10/20 system of electrode placement. One channel was dedicated to EKG recording.    Background: The background consists of irregular generalized delta activity. No clear posterior dominant rhythm was seen. A few minutes into the recording, he begins having periodic discharges with either triphasic or delta wave morphology discharges with a frequency of approximately 1 Hz. These are maximal in the left posterior quadrant(P7 > O1 > T7, P3). At 6 minutes into the recording there is a partial seizure arising from the same area lasting proximally 40 seconds. There is evolution amplitude until, but no true generalization. Postictally there continue to be periodic discharges without associated fast activity(PLEDs proper). He then proceeds to have intermittent ictal discharges with interictal PLEDs for the remainder of the recording. He has a total of 6 seizures, with variable spread to the contralateral hemisphere. He never clinically generalizes.  Clinically, these are associated with right head turn, right gaze deviation, eye fluttering. Is difficult to tell by the video but I suspect he has some facial dystonic posturing as well.  Postictally, there is generalized delta and left hemisphere as well as continued periodic discharges.  Photic stimulation: Physiologic driving is not performed  EEG Abnormalities: 1) recurrent partial seizures arising from the left posterior quadrant 2) interictal periodic lateralized epileptiform discharges without associated fast activity(PLEDs proper) 3) generalized irregular slow activity 4) absent PDR  Clinical Interpretation: This  EEG recorded recurrent partial seizures with clinical correlate of eye blinking, head turning arising from the left posterior quadrant. PLEDs are typically not felt to be an ictal phenomenon but can be seen postictally, associated with structural lesions.  They can be, however, associated with ongoing seizure activity.   Ritta SlotMcNeill Abdulla Pooley, MD [facility] [phone]  If 7pm- 7am, please page neurology on call as listed in AMION.

## 2016-04-23 NOTE — Progress Notes (Signed)
SLP Cancellation Note  Patient Details Name: Lou CalQuintin Dace MRN: [id number] DOB: 08/03/1986   Cancelled treatment:       Reason Eval/Treat Not Completed: Other (comment) Pt's mother requested that we wait until next date for swallow assessment; not alert.  Pt on chopped diet and fed by parents PTA; no hx of aspiration per his mother's report.     Blenda MountsCouture, Nagee Goates Laurice 04/23/2016, 12:28 PM

## 2016-04-23 NOTE — ED Notes (Signed)
Per patient's mother, patient drank a gatorade and cup of ice water.

## 2016-04-23 NOTE — ED Notes (Signed)
Patient presents with parents stating he has been having several seizures one after the other since 530am Tuesday  Parents deny any changes

## 2016-04-23 NOTE — H&P (Signed)
Family Medicine Teaching [facility] Admission History and Physical Service Pager: [phone]  Patient name: Carlos [name] Medical record number: [id number] Date of birth: Apr 26, 1986 Age: 30 y.o. Gender: male  Primary Care Provider: Jacquelin Hawking, MD Consultants: Neurology Code Status: Full (confirmed with parents, guardianship)  Chief Complaint: Seizures, breakthrough  Assessment and Plan: Carlos [name] is a 30 y.o. male presenting with recurrent breakthrough generalized seizures. 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  Seizures w/o status epilepticus, breakthrough, in setting of chronic congenital complex seizure disorder and cerebral palsy Chronic problem now with breakthrough recurrent seizures onset 24 hours, unclear etiology without med changes or missed doses per caregiver/parents, previously on Keppra 750 BID (had been reduced to 500 BID over past 3 months due to inc sedation on 750). Clinically well appearing and afebrile, tachycardic otherwise hemodynamically stable, consider infectious etiology must be ruled out (CXR neg, UA neg, WBC elevated 13 although could be d/t seizures). Head CT no acute findings (stable agenesis of corpus callosum). - Admit to SDU for closer monitoring with uncertain etiology of breakthrough seizures and limited neuro baseline - S/p 2 L IVF bolus in ED - S/p Ativa  IV x 2 doses in ED for seizures - S/p Keppra  IV load in ED at 0333 - Seizure precautions - Ordered Ativan  IV PRN seizure >5 min, page MD if seizure - Resumed home Lamictal  BID, 1st dose this AM - Hold home Keppra PO at this time, will defer to Neurology - Ordered Lamictal level stat (to be checked prior to 1st lamictal dose) - Additional labs with Mag lvl, lactic acid, CK - Check  blood and urine cultures (low suspicion but to ensure no source of underlying infection) - Neurology consulted by ED, appreciate recommendations and assistance, will follow-up advice on Keppra, MRI, EEG and other diagnostics. Consideration of LP (if other work-up unremarkable, however no immediate concern for infection, seems would be less likely). - Also contacted Pediatric Neurology (followed by Dr Carlos Skeans outpatient), spoke with Dr Merri Brunette, reviewed case, he advised to draw the lamictal level now, consider likely increase Keppra to 750 PM and continue 500 AM as first intervention, then depending on lamictal level may need to increase that as well.  Dysphagia, in setting of congenital cerebral palsy At baseline follows dysphagia 2 or chopped diet with thin liquids for chronic dysphagia, without known history of aspiration. No recent changes, had been tolerating PO well previously. Today limited PO due to sedation after seizures. - S/p PO liquids in ED - Order full liquid diet, dissolve medications - At risk of aspiration, monitor closely, consider resume NPO and SLP eval if any worsening  Constipation, chronic - Stable Last BM yesterday. No recent problems. - Resume home Miralax 17g BID, first dose in PM  Left hip subluxation - Stable Chronic problem recently followed by [facility], no history of fracture or surgery. Reportedly due to muscle deformity, has been source of pain previously, no recent changes. - Tylenol liquid  q 6 hr PRN  FEN/GI: NS 125cc/hr until PO well (s/p 2 L IV bolus) Prophylaxis: Lovenox SQ  Disposition: Admit to SDU for close monitoring given recurrent breakthrough seizures without evidence of status epilepticus, concern with difficulty assessing patient given baseline CP/non-verbal, anticipate quick transition to floor once seizures stabilized and back to neuro baseline. Neurology consulted for further work-up, currently unclear trigger for seizures.  History of  Present Illness:  Carlos [name] is a 30 y.o. male presenting with breakthrough seizures.  Parents report symptoms started with breakthrough seizures starting at 0530 yesterday 04/22/16, initial seizure lasted 1-2 min, no PRN medication given, seemed typical seizure of his with grand mal or generalized tonic-clonic episode, he was post-ictal or drowsy after which is also normal. Later in the day he developed more recurrent breakthrough seizures, up to a frequency of 1 seizure q 1 hour in the afternoon that prompted them to seek immediate medical attention in ED. Previous seizure history normally with similar breakthrough seizures about 1 x weekly for several months now, without worsening. 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 No new medications or changes. Followed by Peds Neuro Dr Carlos [name], last seen 02/2016, no changes but family giving Keppra 500 BID instead of previously recommended 750 BID due to concerns about too much sedation on higher dose. - patient is non-verbal and communication is mostly facial expressions, limited to wheelchair and bed, non-ambulatory - Admits vomit x 1 in ED (clear, non bloody, non bilious), poor PO today but yesterday ate / drank well, tolerated gatorade x 1 in ED - Denies recent illness, sick contact, fever/chills, sweats, nausea, vomiting, cough, congestion, diarrhea, constipation, syncope, rash or bed sore  Review Of Systems: Per HPI with the following additions: None Otherwise the remainder of the systems were negative.  Patient Active Problem List   Diagnosis Date Noted  . Seizure (HCC) 04/23/2016  . Left hip pain 02/05/2016  . Swelling of joint of pelvic region or thigh 02/05/2016  . Tachycardia 02/05/2016  . 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 CN (constipation) 07/25/2015  . Itchy eyes 07/25/2015  . Partial epilepsy with impairment of consciousness, intractable (HCC) 03/14/2013  . 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HCC) 03/14/2013  . Scoliosis associated with other condition 03/14/2013  . Amblyopia 11/03/2012  . Folliculitis barbae 07/10/2011  . URINARY INCONTINENCE, MIXED 08/01/2009  . Intellectual delay 01/14/2007  . Infantile cerebral palsy (HCC) 01/14/2007  . ECZEMA, ATOPIC DERMATITIS 01/14/2007    Past Medical History: Past Medical History  Diagnosis Date  . Groin abscess 03/02/2008  . Cerebral palsy (HCC)   . Seizures (HCC)   . Ventriculomegaly of brain, congenital (HCC)   . Agenesis of corpus callosum (HCC)   . Pneumonia   . Status epilepticus [facility](HCC)     Past Surgical History: Past Surgical History  Procedure Laterality Date  . [place]    Social History: Social History  Substance Use Topics  . Smoking status: Passive Smoke Exposure - Never Smoker  . Smokeless tobacco: Never Used  . Alcohol Use: No   Additional social history: Lives at home with parents as primary caregivers. Severe intellectual disability. No substance use (alcohol, smoking, or other drugs).   Please also refer to relevant sections of EMR.  Family History: Family History  Problem Relation Age of Onset  . Cancer Paternal Grandfather     Died at 2749   Allergies and Medications: No Known Allergies No current facility-administered medications on file prior to encounter.   Current Outpatient Prescriptions on File Prior to Encounter  Medication Sig Dispense Refill  . cetirizine HCl (ZYRTEC) 5 MG/5ML SYRP Take 10 mLs (10 mg total) by mouth daily. 1 Bottle 5  . KEPPRA 500 MG tablet TAKE 1 & 1/2 TABLETS TWICE DAILY AS DIRECTED 90 tablet 5  .  LAMICTAL 200 MG tablet Take 1 tablet (200 mg total) by mouth 2 (two) times daily. (Patient taking differently: Take 200 mg by mouth 2 (two) times daily. With 25mg  = 225mg  total) 60 tablet 5  . LAMICTAL 25 MG tablet Take 1 tablet (25 mg total) by mouth 2 (two) times daily. (Patient  taking differently: Take 25 mg by mouth 2 (two) times daily. With 200mg  = 225 total) 60 tablet 5  . polyethylene glycol powder (GLYCOLAX/MIRALAX) powder DISSOLVE ONE CAPFUL IN 8 0Z. WATER TWICE DAILY 527 g 0  . sodium phosphate Pediatric (FLEET) 3.5-9.5 GM/59ML enema Place 1 enema rectally every three (3) days as needed for constipation.      Objective: BP 124/83 mmHg  Pulse 111  Temp(Src) 98.4 F (36.9 C) (Rectal)  Resp 17  SpO2 99% Exam: General: resting in ED bed with head of bed elevated, well developed, mostly comfortable and limited activity, non-verbal baseline, not interactive with examiner Head: NCAT Eyes: occasional spontaneous eye opening does not make eye contact, conjunctiva clear without redness or drainage, PERRL ENTM: clear nares without congestion, mild dry mucus mem and tongue, oropharynx without lesions Neck: supple, non-tender, no thyromegaly Cardiovascular: tachycardic, regular rhythm, no murmurs, distal pulses +2 bilateral ext Respiratory: CTAB without focal crackles or wheezing, good air movement, no increased work of breathing Abdomen: soft, non-tender, non-distended, no masses palpated, some active BS all quads MSK: spastic deformities of upper and lower extremities (feet) Skin: warm, dry, no ulcerations or rash Neuro: Somnolent with intermittent awakening, non-verbal at baseline with CP and severe intellectual disability, unable to participate in exam not follow commands (also at baseline), some grip in hands but not following has some baseline muscle strength in upper ext, lower ext limited muscle str elicited, gait unable to test Psych: non-verbal unable to fully assess  Labs and Imaging: CBC BMET   Recent Labs Lab 04/23/16 0252  WBC 13.9*  HGB 13.4  HCT 42.3  PLT 293    Recent Labs Lab 04/23/16 0252  NA 138  K 4.2  CL 104  CO2 17*  BUN 8  CREATININE 1.05  GLUCOSE 115*  CALCIUM 10.0     6/7 UA - clear, rare bacteria, neg bili, hyaline  casts, neg hgb, 15 ketone, neg leuks, neg nitrite, protein 30, spec grav 1.027, RBC 0-5, WBC 0-5, squam 0-5  Urine Culture - ordered, pending  Blood culture x 2 - ordered, pending  6/7 Lamictal level - ordered, pending  Lactic Acid - ordered, pending  Imaging  6/7 0348 - Head CT wo contrast IMPRESSION: 1. No acute intracranial process identified. 2. 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 3. 7 mm hypodensity within the left basal ganglia, which may reflect a small remote lacunar infarct versus dilated perivascular space.  6/7 0412 X-ray acute abdomen/chest IMPRESSION: 1. No evidence of acute cardiopulmonary disease. 2. Gaseous distention of colon which was also seen in 2006 and favors air swallowing or dysmotility. No suspected obstruction or Perforation.  6/7 ED EKG Sinus tachycardia HR 140, QTc 452, no acute ST-T wave ischemic changes, no significant change since last  Smitty Cords, DO 04/23/2016, 8:02 AM PGY-3, [facility] Family Medicine FPTS Intern pager: [phone], text pages welcome

## 2016-04-23 NOTE — Consult Note (Signed)
NEURO HOSPITALIST CONSULT NOTE   Requestig physician: Dr. Jennette Kettle   Reason for Consult: Seizure   History obtained from:   Chart  Patient and Chart   HPI:                                                                                                                                          Darcel Frane is an 30 y.o. male "who presents to the Emergency Department complaining of worsening grand mal seizures onset 1 day prior. Family states that he has had about 10 seizures in the last 24 hours. Father states that he has had his seizure medications with no relief. Family states that have also tried to keep him hydrated with no relief. Family states that he is on Keppra and Lamictal. Family states that he has not returned to baseline since the onset of seizures. Family states that his seizures are usually well controlled. " His home AED include Keppra 750 mg BID, Lamictal 225 mg BID. At this time no Lamictal level has been obtained.  Per Dr. 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 Juda's seizures are usually nocturnal generalized tonic-clonic seizures that last 1-2 minutes in length but he also can have brief seizures while awake. He is taking and tolerating Lamotrigine and Levetiracetam for his seizure disorder. Mother feels there is increased seizure activity with changes in barometric pressure.   Upon arriving on 2C (during consultation) his eyes were deviated to the right with rhythmic twitching but no appendicular movement. Mother was called to visualize and she noted this is not his typical seizure activity (usually TC) but this is not normal for him. 1 MG Ativan was drawn up but not given. STAT EEG ordered. While EEG was being placed he slowly was improving and able to move his arms and  seemed to track movement.   He has not had a flurry of seizures for about one year and has about one seizure a week. They last for about 2 minutes. He may have two but if they have more than this family knows something is not right. One year ago he had a similar event and Keppra was increase to 750 mg BID but mother felt he was not the same so this was reduced back to 500 mg BID.   Baseline he is a full assist, non-verbal, moves all extremities.    Past Medical History  Diagnosis Date  . Groin abscess 03/02/2008  . Cerebral palsy (HCC)   . Seizures (HCC)   . Ventriculomegaly of brain, congenital (HCC)   . Agenesis of corpus callosum (HCC)   . Pneumonia   . Status epilepticus (HCC)  Past Surgical History  Procedure Laterality Date  . Strabismus surgery      in [place]    Family History  Problem Relation Age of Onset  . Cancer Paternal Grandfather     Died at 42     Social History:  reports that he has been passively smoking.  He has never used smokeless tobacco. He reports that he does not drink alcohol or use illicit drugs.  No Known Allergies  MEDICATIONS:                                                                                                                     Prior to Admission:  Prescriptions prior to admission  Medication Sig Dispense Refill Last Dose  . cetirizine HCl (ZYRTEC) 5 MG/5ML SYRP Take 10 mLs (10 mg total) by mouth daily. 1 Bottle 5 04/22/2016 at Unknown time  . KEPPRA 500 MG tablet TAKE 1 & 1/2 TABLETS TWICE DAILY AS DIRECTED 90 tablet 5 04/22/2016 at Unknown time  . LAMICTAL 200 MG tablet Take 1 tablet (200 mg total) by mouth 2 (two) times daily. (Patient taking differently: Take 200 mg by mouth 2 (two) times daily. With  =  total) 60 tablet 5 04/22/2016 at 100 pm   . LAMICTAL 25 MG tablet Take 1 tablet (25 mg total) by mouth 2 (two) times daily. (Patient taking differently: Take 25 mg by mouth 2 (two) times daily. With  = 225 total) 60  tablet 5 04/22/2016 at 100 pm   . polyethylene glycol powder (GLYCOLAX/MIRALAX) powder DISSOLVE ONE CAPFUL IN 8 0Z. WATER TWICE DAILY 527 g 0 04/22/2016 at Unknown time  . sodium phosphate Pediatric (FLEET) 3.5-9.5 GM/59ML enema Place 1 enema rectally every three (3) days as needed for constipation.   PRN   Scheduled:    ROS:                                                                                                                                       History obtained from parents  General ROS: negative for - chills, fatigue, fever, night sweats, weight gain or weight loss Psychological ROS: negative for - behavioral disorder, hallucinations, memory difficulties, mood swings or suicidal ideation Ophthalmic ROS: negative for - blurry vision, double vision, eye pain or loss of vision ENT ROS: negative for - epistaxis, nasal discharge, oral lesions, sore throat, tinnitus or vertigo Allergy and Immunology  ROS: negative for - hives or itchy/watery eyes Hematological and Lymphatic ROS: negative for - bleeding problems, bruising or swollen lymph nodes Endocrine ROS: negative for - galactorrhea, hair pattern changes, polydipsia/polyuria or temperature intolerance Respiratory ROS: negative for - cough, hemoptysis, shortness of breath or wheezing Cardiovascular ROS: negative for - chest pain, dyspnea on exertion, edema or irregular heartbeat Gastrointestinal ROS: negative for - abdominal pain, diarrhea, hematemesis, nausea/vomiting or stool incontinence Genito-Urinary ROS: negative for - dysuria, hematuria, incontinence or urinary frequency/urgency Musculoskeletal ROS: negative for - joint swelling or muscular weakness Neurological ROS: as noted in HPI Dermatological ROS: negative for rash and skin lesion changes   Blood pressure 124/83, pulse 111, temperature 98.4 F (36.9 C), temperature source Rectal, resp. rate 17, SpO2 99 %.   Neurologic Examination:                                                                                                       HEENT-  Normocephalic, no lesions, without obvious abnormality.  Normal external eye and conjunctiva.  Normal TM's bilaterally.  Normal auditory canals and external ears. Normal external nose, mucus membranes and septum.  Normal pharynx. Cardiovascular- S1, S2 normal, pulses palpable throughout   Lungs- chest clear, no wheezing, rales, normal symmetric air entry Abdomen- soft, non-tender; bowel sounds normal; no masses,  no organomegaly Extremities- no edema Lymph-no adenopathy palpable Musculoskeletal-no joint tenderness, deformity or swelling Skin-warm and dry, no hyperpigmentation, vitiligo, or suspicious lesions  Neurological Examination Mental Status: Currently eyes deviated to the left but slowly improving and moving arms spontaneously. No TC activity.  Cranial Nerves: II:  No blink to threat. pupils equal, round, reactive to light and accommodation III,IV, VI: ptosis not present,eyes deviated to the right V,VII: face symmetric, facial light touch sensation normal bilaterally  Motor: Initially flaccid in all extremities but now moving wrist and hands spontaneously but not antigravity Sensory: withdraws to noxious stimuli Deep Tendon Reflexes: 2+ and symmetric throughout Plantars: Mute bilaterally Cerebellar: Not able to assess Gait: not able to assess     Lab Results: Basic Metabolic Panel:  Recent Labs Lab 04/23/16 0252  NA 138  K 4.2  CL 104  CO2 17*  GLUCOSE 115*  BUN 8  CREATININE 1.05  CALCIUM 10.0    Liver Function Tests:  Recent Labs Lab 04/23/16 0252  AST 26  ALT 10*  ALKPHOS 71  BILITOT 0.5  PROT 8.1  ALBUMIN 4.1   No results for input(s): LIPASE, AMYLASE in the last 168 hours. No results for input(s): AMMONIA in the last 168 hours.  CBC:  Recent Labs Lab 04/23/16 0252  WBC 13.9*  NEUTROABS 12.4*  HGB 13.4  HCT 42.3  MCV 76.9*  PLT 293    Cardiac Enzymes: No results  for input(s): CKTOTAL, CKMB, CKMBINDEX, TROPONINI in the last 168 hours.  Lipid Panel: No results for input(s): CHOL, TRIG, HDL, CHOLHDL, VLDL, LDLCALC in the last 168 hours.  CBG:  Recent Labs Lab 04/23/16 0311  GLUCAP 117*    Microbiology: Results for orders placed or performed during the hospital  encounter of 03/06/13  Urine culture     Status: None   Collection Time: 03/06/13  1:54 PM  Result Value Ref Range Status   Specimen Description URINE, RANDOM  Final   Special Requests NONE  Final   Culture  Setup Time 03/07/2013 01:54  Final   Colony Count NO GROWTH  Final   Culture NO GROWTH  Final   Report Status 03/08/2013 FINAL  Final    Coagulation Studies: No results for input(s): LABPROT, INR in the last 72 hours.  Imaging: Ct Head Wo Contrast  04/23/2016  CLINICAL DATA:  Initial evaluation for probable worsening seizures. History of cerebral palsy. EXAM: CT HEAD WITHOUT CONTRAST TECHNIQUE: Contiguous axial images were obtained from the base of the skull through the vertex without intravenous contrast. COMPARISON:  None. FINDINGS: Agenesis of the corpus callosum with associated colpocephaly present. The posterior aspect of the lateral ventricles are markedly dilated extending from their occipital horns through the temporal horns. This is suspected to be chronic. Associated cerebral white matter loss within these regions. Third and fourth ventricles are decompressed. Cerebellum somewhat hypoplastic. No midline lipoma or other lesion identified. No acute large vessel territory infarct. Possible small remote lacunar infarct within the left basal ganglia versus dilated perivascular space. No acute intracranial hemorrhage. No mass lesion or midline shift. No extra-axial fluid collection. Scalp soft tissues demonstrate no acute abnormality. Few small lipoma is noted. No acute abnormality about the globes and orbits. Paranasal sinuses are clear. No mastoid effusion. Middle ear cavities are  clear. Calvarium intact. IMPRESSION: 1. No acute intracranial process identified. 2. 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 3. 7 mm hypodensity within the left basal ganglia, which may reflect a small remote lacunar infarct versus dilated perivascular space. Electronically Signed   By: Rise MuBenjamin  McClintock M.D.   On: 04/23/2016 04:41   Dg Abd Acute W/chest  04/23/2016  CLINICAL DATA:  Multiple seizures.  Constipation. EXAM: DG ABDOMEN ACUTE W/ 1V CHEST COMPARISON:  12/27/2004 FINDINGS: Normal heart size and mediastinal contours. Low volume chest without infiltrate or edema. No effusion or pneumothorax. Prominent colonic gas without obstructive pattern. This appearance was also seen in 2006 and could be from air swallowing or dysmotility. No evidence of pneumoperitoneum or pneumatosis. 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 IMPRESSION: 1. No evidence of acute cardiopulmonary disease. 2. Gaseous distention of colon which was also seen in 2006 and favors air swallowing or dysmotility. No suspected obstruction or perforation. Electronically Signed   By: Marnee SpringJonathon  Watts M.D.   On: 04/23/2016 04:25       Assessment and plan per attending neurologist  Felicie Mornavid Smith PA-C [facility] [phone]  04/23/2016, 8:45 AM  30 yo M with severe intellectual disability and epilepsy who presents with recurrent seizures. He has received a dose of 1gm IV keppra at 3am, as well as ativan 2mg  total.   On exam, he appears to partially rtack, but does not clearly cross midline to the left. He has intact pupils and blinks to eyelid stimulation bilaterally. He has a spastic quadriparesis, but does move both arms spontaneously.   Of note, in the past, when family increased his dose to 750mg  BID, they noticed he was sleepy and therefore went back down to 500mg  BId.   EEG records recurrent partial seizures.   Assessment/Plan: 1) Give  additional 500mg  IV keppra in addition to his typical dose.  2) give 1 mg IV ativan.  3) Give am  dose of lamotrigine(225mg ) 4) will continue to follow.    Ritta Slot, MD [facility] [phone]  If 7pm- 7am, please page neurology on call as listed in AMION.

## 2016-04-23 NOTE — Progress Notes (Signed)
STAT EEG completed this morning

## 2016-04-24 ENCOUNTER — Inpatient Hospital Stay (HOSPITAL_COMMUNITY)

## 2016-04-24 DIAGNOSIS — G40919 Epilepsy, unspecified, intractable, without status epilepticus: Secondary | ICD-10-CM

## 2016-04-24 LAB — CBC WITH DIFFERENTIAL/PLATELET
BASOS ABS: 0 10*3/uL (ref 0.0–0.1)
Basophils Relative: 0 %
EOS ABS: 0 10*3/uL (ref 0.0–0.7)
Eosinophils Relative: 0 %
HEMATOCRIT: 35.9 % — AB (ref 39.0–52.0)
Hemoglobin: 11.3 g/dL — ABNORMAL LOW (ref 13.0–17.0)
LYMPHS ABS: 0.9 10*3/uL (ref 0.7–4.0)
Lymphocytes Relative: 11 %
MCH: 23.3 pg — ABNORMAL LOW (ref 26.0–34.0)
MCHC: 31.5 g/dL (ref 30.0–36.0)
MCV: 73.9 fL — AB (ref 78.0–100.0)
Monocytes Absolute: 0.5 10*3/uL (ref 0.1–1.0)
Monocytes Relative: 7 %
NEUTROS PCT: 82 %
Neutro Abs: 6.2 10*3/uL (ref 1.7–7.7)
PLATELETS: 278 10*3/uL (ref 150–400)
RBC: 4.86 MIL/uL (ref 4.22–5.81)
RDW: 13.7 % (ref 11.5–15.5)
WBC: 7.2 10*3/uL (ref 4.0–10.5)

## 2016-04-24 LAB — BASIC METABOLIC PANEL
Anion gap: 8 (ref 5–15)
BUN: <5 mg/dL — ABNORMAL LOW (ref 6–20)
CALCIUM: 9.2 mg/dL (ref 8.9–10.3)
CO2: 24 mmol/L (ref 22–32)
CREATININE: 0.7 mg/dL (ref 0.61–1.24)
Chloride: 107 mmol/L (ref 101–111)
GFR calc Af Amer: >60 mL/min (ref >60)
GFR calc non Af Amer: >60 mL/min (ref >60)
GLUCOSE: 88 mg/dL (ref 65–99)
Potassium: 3.6 mmol/L (ref 3.5–5.1)
Sodium: 139 mmol/L (ref 135–145)

## 2016-04-24 LAB — GLUCOSE, CAPILLARY: Glucose-Capillary: 94 mg/dL (ref 65–99)

## 2016-04-24 LAB — URINE CULTURE: Culture: NO GROWTH

## 2016-04-24 LAB — LAMOTRIGINE LEVEL: Lamotrigine Lvl: 5.2 ug/mL (ref 2.0–20.0)

## 2016-04-24 MED ORDER — SODIUM CHLORIDE 0.9 % IV SOLN
1000.0000 mg | Freq: Once | INTRAVENOUS | Status: AC
  Administered 2016-04-24: 1000 mg via INTRAVENOUS
  Filled 2016-04-24: qty 10

## 2016-04-24 MED ORDER — SODIUM CHLORIDE 0.9 % IV SOLN
150.0000 mg | Freq: Two times a day (BID) | INTRAVENOUS | Status: DC
  Administered 2016-04-24 – 2016-04-25 (×2): 150 mg via INTRAVENOUS
  Filled 2016-04-24 (×3): qty 15

## 2016-04-24 MED ORDER — SODIUM CHLORIDE 0.9 % IV SOLN
150.0000 mg | Freq: Two times a day (BID) | INTRAVENOUS | Status: DC
  Filled 2016-04-24: qty 15

## 2016-04-24 MED ORDER — SODIUM CHLORIDE 0.9 % IV SOLN
200.0000 mg | Freq: Once | INTRAVENOUS | Status: AC
  Administered 2016-04-24: 200 mg via INTRAVENOUS
  Filled 2016-04-24: qty 20

## 2016-04-24 MED ORDER — SODIUM CHLORIDE 0.9 % IV SOLN
750.0000 mg | Freq: Two times a day (BID) | INTRAVENOUS | Status: DC
  Administered 2016-04-24 – 2016-04-25 (×4): 750 mg via INTRAVENOUS
  Filled 2016-04-24 (×5): qty 7.5

## 2016-04-24 MED ORDER — LORAZEPAM 2 MG/ML IJ SOLN: 1.0000 mg | INTRAMUSCULAR | Status: DC | PRN

## 2016-04-24 MED ORDER — MAGNESIUM SULFATE 2 GM/50ML IV SOLN
2.0000 g | Freq: Once | INTRAVENOUS | Status: AC
  Administered 2016-04-24: 2 g via INTRAVENOUS
  Filled 2016-04-24: qty 50

## 2016-04-24 MED ORDER — SODIUM CHLORIDE 0.9 % IV SOLN
100.0000 mg | Freq: Once | INTRAVENOUS | Status: AC
  Administered 2016-04-24: 100 mg via INTRAVENOUS
  Filled 2016-04-24 (×2): qty 10

## 2016-04-24 MED ORDER — LEVETIRACETAM 500 MG/5ML IV SOLN
1250.0000 mg | Freq: Two times a day (BID) | INTRAVENOUS | Status: DC
  Filled 2016-04-24 (×2): qty 12.5

## 2016-04-24 MED ORDER — SODIUM CHLORIDE 0.9 % IV SOLN
100.0000 mg | Freq: Two times a day (BID) | INTRAVENOUS | Status: DC
  Administered 2016-04-24: 100 mg via INTRAVENOUS
  Filled 2016-04-24 (×3): qty 10

## 2016-04-24 NOTE — Progress Notes (Signed)
Another seizure witnessed from 1702-1704 lasting 1.5 minutes again. Patient turned to right side and suctioned. Resting again. Awaiting additional vimpat dosage from pharmacy, which was added by Dr. Amada JupiterKirkpatrick after onset of 1st breakthrough seizure. Dr. Amada JupiterKirkpatrick notified of second seizure.  Noe GensStefanie A Kevona Lupinacci, RN

## 2016-04-24 NOTE — Progress Notes (Signed)
SLP Cancellation Note  Patient Details Name: Lou CalQuintin Manheim MRN: [id number] DOB: 05/17/1986   Cancelled treatment:       Reason Eval/Treat Not Completed: Patient's level of consciousness.  Pt with continued seizures over night; still not sufficiently alert to eat or drink.  Discussed with father.  Will follow for readiness.    Blenda MountsCouture, Dmitri Pettigrew Laurice 04/24/2016, 11:29 AM

## 2016-04-24 NOTE — Progress Notes (Addendum)
Went to evaluate patient for increased frequency of seizures. Per mom he had been having seizuires all day and feels that they have continued to be frequent. She also feels he has continued to have decreased wakefulness since arrival this AM. He has been unable to eat or drink because of this  BP 111/72 mmHg  Pulse 119  Temp(Src) 100.8 F (38.2 C) (Axillary)  Resp 16  Ht 5' (1.524 m)  Wt 106 lb 7.7 oz (48.3 kg)  BMI 20.80 kg/m2  SpO2 96%  Exam Gen:  Sleeping, NAD Pulm: course breath sounds anteriorly, unclear if they are transmitted upper airway sounds CV: tachycardic, regular rhythm Extremties: upper and lower extremity spastic deformties  A/P 30 y/o male with CP and seizure disorder, now with increasing seizure events  Seizures- Ativan 1 mg given with continued seizure activity afterwards. Per nurse the ativan only helped to shorten his subsequent seizures. Neuro was called with the recommendation to give additional keppra load of 1000 IV, continue keppra 1250 IV BID. Give Vimpat 200 mg then continue 100 BID. Neuro following.   Fever: Had a low grade fever but seizures can cause increase in body temp. No clear infectious source on admission. He did not have a CXR on admission, will obtain one now. He is as risk for aspiration given chronic dysphagia. Will continue to trend fever. If continues to have fever, will need LP   Alyssa A. Kennon RoundsHaney MD, MS Family Medicine Resident PGY-2 Pager [phone]

## 2016-04-24 NOTE — Progress Notes (Signed)
In room to assist with cleaning up patient and patient had a seizure. Seizure lasted about 1.5 minutes from 1615 to 1617. Patient turned to right side and mouth suctioned. Patient stopped seizing with no evidence of repeat at this time. Neuro and FMTS informed.   Noe GensStefanie A Vernesha Talbot, RN

## 2016-04-24 NOTE — Progress Notes (Signed)
Family Medicine Teaching Service Daily Progress Note Intern Pager: [phone]  Patient name: Carlos CalQuintin Cozine Medical record number: [id number] Date of birth: 12/07/1985 Age: 30 y.o. Gender: male  Primary Care Provider: Jacquelin Hawkingalph Nettey, MD Consultants: Neuro Code Status: FULL  Pt Overview and Major Events to Date:  6/7: admit for seizures, breakthrough:  S/p Ativan, loaded with Keppra in ED; EEG abnormal 6/8: Continued seizures, additional Keppra load and increased maintenance dose, add Vimpat after load  Assessment and Plan: Carlos [name] is a 30 y.o. male presenting with recurrent breakthrough generalized seizures. 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 Clinically well appearing and afebrile, tachycardic otherwise hemodynamically stable. Infectious etiology considered but work up thus far unremarkable (leukocytosis on admission now resolved, likely de-margination in the setting of multiple seizures) . Head CT no acute findings (stable agenesis of corpus callosum). Additional labs on admission: normal lactic acid and CK EEG abnormal with recurrent partial seizures.  - Seizure precautions - Ativan 1mg  IV PRN seizure, page MD if seizure - Neurology consulted, appreciate assistance: decrease Keppra dose to 750mg  BID, continue Vimpat, Lamictal when able to take PO - IV Keppra 750mg  BID - Home Lamictal 225mg  BID- currently NPO - Lamictal level on admission (6/7) in process - blood and urine cultures pending (low suspicion but to ensure no source of underlying infection)  GI: Dysphagia, in setting of  congenital cerebral palsy At baseline follows dysphagia 2 or chopped diet with thin liquids for chronic dysphagia, without known history of aspiration. No recent changes, had been tolerating PO well previously.  - currently NPO due to possible risk of aspiration/post ictal after multiple seizures - SLP consulted  Constipation, chronic - Stable Last BM 6/7. No recent problems. - home Miralax 17g BID (currently held 2/2 NPO)  MSK: Left hip subluxation - Stable Chronic problem recently followed by [facility], no history of fracture or surgery. Reportedly due to muscle deformity, has been source of pain previously, no recent changes. - Tylenol liquid 500mg  q 6 hr PRN  Hypomagnesemia: 1.6 - replete with IV mag 2 g IVPB  FEN/GI: NS 125cc/hr until PO well Prophylaxis: Lovenox SQ  Disposition: pending improvement  Subjective:  Patient had 3 total seizures overnight: lasting 45secs, >5 second, and ~45 seconds. Ativan was given once. Neurology was called and recommended giving Keppra Load of 1000IV, increase to Keppra 1250IV BID, give Vimpat 200mg  then continue 100BID. CBG checked (94). Patient did have low grade fever early this AM at 100.8. CXR was also obtained which showed no acute abnormalities.   Mother at bedside this AM. Reports pt is still not at baseline. Concerned that work up has not shown a cause for continued seizures. Mother reports he had seizures yesterday morning as well.   Objective: Temp:  [97.6 F (36.4 C)-100.8 F (38.2 C)] 100.8 F (38.2 C) (06/08 0400) Pulse Rate:  [85-119] 119 (06/08 0400) Resp:  [11-22] 16 (06/08 0400) BP: (108-140)/(72-91) 111/72 mmHg (06/08 0400) SpO2:  [96 %-100 %] 96 % (06/08 0400) Weight:  [48.3 kg (106 lb 7.7 oz)] 48.3 kg (106 lb 7.7 oz) (06/07 0901) Physical Exam: General: Did open eyes spontaneously but would go back to sleep, nonverbal (baseline) Cardiovascular: tachycardia, no m/r/g Respiratory: no  increased wob, CTAB anteriorly   Abdomen: soft, NT, ND, + BS Extremities: no LE edema.   Laboratory:  Recent Labs Lab 04/23/16 0252 04/24/16 0435  WBC 13.9* 7.2  HGB 13.4 11.3*  HCT 42.3 35.9*  PLT 293 278    Recent Labs Lab 04/23/16 0252 04/24/16 0435  NA 138 139  K 4.2 3.6  CL 104 107  CO2 17* 24  BUN 8 <5*  CREATININE 1.05 0.70  CALCIUM 10.0 9.2  PROT 8.1  --   BILITOT 0.5  --   ALKPHOS 71  --   ALT 10*  --   AST 26  --   GLUCOSE 115* 88   Lactic Acid 1.4 > 1.3 UA 6/7: 15 ketones, 30 protein   Imaging/Diagnostic Tests: CXR:  FINDINGS: The lungs are adequately inflated and clear. The heart and mediastinal structures are normal. There is no pleural effusion or pneumothorax. There is mild dextrocurvature centered in the mid thoracic spine.  There is moderate gaseous distention of the stomach as well as large bowel loops.  IMPRESSION: There is no active cardiopulmonary disease. Persistent gaseous distention of the stomach and visualized large bowel loops similar to that seen in 2014 and in 2005.  6/7: EEG EEG Abnormalities: 1) recurrent partial seizures arising from the left posterior quadrant 2) interictal periodic lateralized epileptiform discharges without associated fast activity(PLEDs proper) 3) generalized irregular slow activity 4) absent PDR  6/7 0348 - Head CT wo contrast IMPRESSION: 1. No acute intracranial process identified. 2. 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 3. 7 mm hypodensity within the left basal ganglia, which may reflect a small remote lacunar infarct versus dilated perivascular space.  6/7 0412 X-ray acute abdomen/chest IMPRESSION: 1. No evidence of acute cardiopulmonary disease. 2. Gaseous distention of colon which was also seen in 2006 and favors air swallowing or dysmotility. No suspected obstruction or Perforation.  6/7 ED EKG Sinus tachycardia HR 140, QTc 452, no acute ST-T wave ischemic  changes, no significant change since last  Palma Holter, MD 04/24/2016, 6:52 AM PGY-1, [facility] Family Medicine FPTS Intern pager: [phone], text pages welcome

## 2016-04-24 NOTE — Progress Notes (Signed)
Another breakthrough seizures. I suspect a lot of his sedation is due to keppra and would be hesitant to increase this. I will increase vimpat at this time, though if he continues to have seizures, may need to consider NG to allow lamictal dosing.   Ritta SlotMcNeill Britaney Espaillat, MD [facility] [phone]  If 7pm- 7am, please page neurology on call as listed in AMION.

## 2016-04-24 NOTE — Progress Notes (Signed)
Pt had 2 additional seizures within 9 minutes. The initial event lasted > 5 seconds and the second event lasted ~45 seconds. Both had the same clinical presentation as the previously documented events.  Both events were witnessed by this RN.  MD was notified.  Was advised to get a STAT CBG.  Will obtain, all other vitals WNL.  Will continue to monitor.

## 2016-04-24 NOTE — Progress Notes (Signed)
Pt HR elevated to 170. Went to room to check on pt. At that time patient began seizing again. ~45 seconds. Mild to moderate tonic-clonic movement. Pt was turned to rt side. MD notified.  Gave 1mg  IV ativan per MD due to frequency of seizure activity. Will continue to monitor.

## 2016-04-24 NOTE — Progress Notes (Signed)
Patient's HR on the monitor spiked into the 160's. Upon entering the room this RN noted that the patient was actively seizing. Pt turned rt side. Seizure duration was ~2 minutes. Minimal tonic-clonic movement. UTA post-ictal orientation due to pt H/X cerebral palsy. Within 15 minutes noted similar activity on the monitor. Again upon entering the room the patient having active seizure. Pt turned to rt side.  Full tonic-clonic movement. Again UTA post-ictally. First event occurred ~0215, second event occurred 0229. Both events witnessed by patient's mother.  MD notified. Will continue to monitor.

## 2016-04-24 NOTE — Progress Notes (Signed)
Subjective: Per mother he has had multiple seizures over night >3 but <10. Some typical for his TC and others described a gurgling like he was about to have a seizure but did not. He was loaded with Vimpat 200 mg and added Vimpat 100 mg BID to his regime. Currently no clinical seizure noted and agitated when I attempt to look at his left eye which mother noted "was droopy".   Exam: Filed Vitals:   04/24/16 0400 04/24/16 0800  BP: 111/72 124/86  Pulse: 119 131  Temp: 100.8 F (38.2 C) 99.1 F (37.3 C)  Resp: 16 18        Gen: In bed, NAD MS: awake, at baseline does not follow commands. Agitated when exam done.  ZO:XWRUEACN:PERRLA, left eye has ptosis, face symmetrical But he does have some mild swelling over the left forehead giving a slightly) to his left eye. Motor: moving all extremities antigravity spontaniously Sensory:with draws from touch.   Pertinent Labs/Diagnostics: Lamictal, blood cultures and Keppra level pending  EEG Clinical Interpretation: This EEG recorded recurrent partial seizures with clinical correlate of eye blinking, head turning arising from the left posterior quadrant. PLEDs are typically not felt to be an ictal phenomenon but can be seen postictally, associated with structural lesions. They can be, however, associated with ongoing seizure activity.   Felicie MornDavid Smith PA-C [facility] [phone]  Mother states that they try to give him his Lamictal last night, but just rolled out of his mouth.  Impression: 30 yo M with severe intellectual disability and epilepsy who presents with recurrent seizures. EEG demonstrated recurrent partial seizures with clinical correlate of eye blinking, head turning arising from the left posterior quadrant. Over night has continued to have intermittent seizures Thus Vimpat was added to his AED regime.   I think that some cerebral imaging would be prudent at this point, I do not think he would hold still for an MRI and  therefore we'll perform CT instead.  His sedation very well could be medication effect, I would lower his Keppra given that this is been a very sedating medication in the past.  Recommendations: 1) CT head 2) decrease Keppra back to 750 twice a day 3) continue Vimpat 100 mg twice a day 4) restart lamotrigine once able swallow. 5) we will continue to follow  Ritta SlotMcNeill Carlos Simao, MD [facility] [phone]  If 7pm- 7am, please page neurology on call as listed in AMION.  04/24/2016, 9:49 AM

## 2016-04-24 NOTE — Progress Notes (Signed)
Pt mother reported that she found a bug on her pillow this morning as she was lying in the chair in the patients room. The insect in question was caught and sealed in a sample bottle.  [facility] was notified and said they would respond during day shift. The sealed bottle was given to the patients day shift nurse.

## 2016-04-25 ENCOUNTER — Inpatient Hospital Stay (HOSPITAL_COMMUNITY)

## 2016-04-25 DIAGNOSIS — R509 Fever, unspecified: Secondary | ICD-10-CM

## 2016-04-25 LAB — BASIC METABOLIC PANEL
Anion gap: 11 (ref 5–15)
CALCIUM: 9 mg/dL (ref 8.9–10.3)
CHLORIDE: 108 mmol/L (ref 101–111)
CO2: 22 mmol/L (ref 22–32)
CREATININE: 0.83 mg/dL (ref 0.61–1.24)
GFR calc non Af Amer: >60 mL/min (ref >60)
GLUCOSE: 74 mg/dL (ref 65–99)
Potassium: 3.2 mmol/L — ABNORMAL LOW (ref 3.5–5.1)
Sodium: 141 mmol/L (ref 135–145)

## 2016-04-25 LAB — LEVETIRACETAM LEVEL: Levetiracetam Lvl: 4.4 ug/mL — ABNORMAL LOW (ref 10.0–40.0)

## 2016-04-25 LAB — MAGNESIUM: Magnesium: 1.7 mg/dL (ref 1.7–2.4)

## 2016-04-25 MED ORDER — SODIUM CHLORIDE 0.9 % IV SOLN
200.0000 mg | Freq: Two times a day (BID) | INTRAVENOUS | Status: DC
  Administered 2016-04-25: 200 mg via INTRAVENOUS
  Filled 2016-04-25 (×2): qty 20

## 2016-04-25 MED ORDER — POTASSIUM CHLORIDE 10 MEQ/100ML IV SOLN
10.0000 meq | INTRAVENOUS | Status: AC
  Administered 2016-04-25 (×4): 10 meq via INTRAVENOUS
  Filled 2016-04-25 (×4): qty 100

## 2016-04-25 MED ORDER — MAGNESIUM SULFATE 2 GM/50ML IV SOLN
2.0000 g | Freq: Once | INTRAVENOUS | Status: AC
  Administered 2016-04-25: 2 g via INTRAVENOUS
  Filled 2016-04-25: qty 50

## 2016-04-25 NOTE — Procedures (Signed)
History: 30 year old male with a history of mental retardation and seizures  Sedation: None  Technique: This is a 21 channel routine scalp EEG performed at the bedside with bipolar and monopolar montages arranged in accordance to the international 10/20 system of electrode placement. One channel was dedicated to EKG recording.    Background: The background consists of irregular delta and theta activities. There are periodic lateralized epileptiform discharges (PLEDs) in the left frontal region with frequency of 1.5 Hz dominant with theta wave morphology, though sometimes it does appear slightly sharply contoured. There are 2 brief right-sided discharges lasting 10-20 seconds with occipital predominance and a frequency of 2 Hz with sharp wave morphology. There is no definite evolution associated with these discharges. He also has individual right parieto-occipital sharp waves.  Photic stimulation: Physiologic driving is now performed  EEG Abnormalities: 1) 2 brief electrographic discharges concerning for seizure lasting less than 20 seconds 2) frequent right parieto-occipital sharp waves 3) periodic lateralized epileptiform discharges maximal in the left frontal region 4) generalized irregular slow activity  Clinical Interpretation: This EEG recorded 2 brief discharges lasting less than 20 seconds which are concerning for possible seizure, though are not associated with clinical correlate and could be interictal in nature(e.g. tains of sharp waves). PLEDs are typically not felt to be an ictal phenomenon but can be seen postictally, associated with structural lesions, or rarely represent ongoing seizure.   Ritta SlotMcNeill Lizbet Cirrincione, MD [facility] [phone]  If 7pm- 7am, please page neurology on call as listed in AMION.

## 2016-04-25 NOTE — Progress Notes (Addendum)
Subjective: Currently at baseline per father. Last seizure was at 0100 this AM. Father was able to give patient his Lamictal this AM.  Exam: Filed Vitals:   04/25/16 0400 04/25/16 0800  BP: 121/75 126/86  Pulse: 69 86  Temp: 99.1 F (37.3 C) 98.8 F (37.1 C)  Resp: 19 10        Gen: In bed, NAD MS: tracts my movement. Able to move all extremities. Non-vocal at baseline. Awake.  CN: PERRLA, EOMI, TML, grimaces when touching face lightly Motor: MAEW Sensory: intact throughout   Pertinent Labs/Diagnostics: none  Felicie MornDavid Smith PA-C [facility] [phone]  Impression: 30 yo M with severe intellectual disability and epilepsy who presents with recurrent seizures. EEG demonstrated recurrent partial seizures with clinical correlate of eye blinking, head turning arising from the left posterior quadrant. Had two further seizures last night. Currently on Keppra 750 mg BID, Vimpat 150 mg BID and father has started to give his home dose of Lamictal with first dose this AM.    Recommendations: 1) repeat EEG 2) continue current AEDs, including lamictal.  3) will follow.   Ritta SlotMcNeill Kyanna Mahrt, MD [facility] [phone]  If 7pm- 7am, please page neurology on call as listed in AMION.

## 2016-04-25 NOTE — Progress Notes (Signed)
EEG Completed; Results Pending  

## 2016-04-25 NOTE — Progress Notes (Signed)
Seizure witnessed at 2124-2125 lasting 1 minute. Patient turned to the right side and suctioned. Patient rested after seizure stopped. MD notified. Second seizure witnessed 2217-2218. Patient turned to the right and suctioned. Patient rested after. MD notified again. Vimpat given per MD. Per MD to give ativan 1 mg if a third seizure occurred.

## 2016-04-25 NOTE — Progress Notes (Signed)
Family Medicine Teaching Service Daily Progress Note Intern Pager: [phone]  Patient name: Carlos [name] Medical record number: [id number] Date of birth: 01/19/1986 Age: 30 y.o. Gender: male  Primary Care Provider: Jacquelin Hawkingalph Nettey, MD Consultants: Neuro Code Status: FULL  Pt Overview and Major Events to Date:  6/7: admit for seizures, breakthrough:  S/p Ativan, loaded with Keppra in ED; EEG abnormal 6/8: Continued seizures, additional Keppra load and increased maintenance dose, added Vimpat after load  Assessment and Plan: Carlos [name] is a 30 y.o. male presenting with recurrent breakthrough generalized seizures. 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 Infectious etiology considered but work up thus far unremarkable (leukocytosis on admission now resolved, likely de-margination in the setting of multiple seizures). Head CT no acute findings (stable agenesis of corpus callosum). Additional labs on admission: normal lactic acid and CK EEG abnormal with recurrent partial seizures. Normal Lamictal level on admission (5.2). Low Keppra level on admission (4.4) - Seizure precautions - Ativan 1mg  IV PRN seizure, page MD if seizure - Neurology consulted, appreciate assistance: discussed with neurology if LP is needed to further evaluate patient's breakthrough seizures. Per neurology, LP not indicated.  - IV Keppra 750mg  BID, Vimpat 150 BID  - Home Lamictal 225mg  BID- father was able to give in apple sauce this AM - blood culture: NG x 1 day - will repeat blood culture today - urine cultures: No growth  (final)   GI: Dysphagia, in setting of congenital cerebral palsy At baseline follows dysphagia 2 or chopped diet with thin liquids for chronic dysphagia, without known history of aspiration. No recent changes, had been tolerating PO well previously.  - SLP consulted: nectar thick dysphagia 1  Constipation, chronic - Stable Last BM 6/7. No recent problems. - home Miralax 17g BID   MSK: Left hip subluxation - Stable Chronic problem recently followed by [facility], no history of fracture or surgery. Reportedly due to muscle deformity, has been source of pain previously, no recent changes. - Tylenol liquid 500mg  q 6 hr PRN  Hypomagnesemia: 1.6 s/p repleted with IV mag 2 g IVPB on 6/8 - Mag 1.7 this AM (will replete after repletion of K) - check daily  Hypokalemia: 3.2 this AM.  - IV Potassium 10mEq x 4 each administered over 1 hour  FEN/GI: Nectar Thick dysphagia 1, NS 125cc/hr until PO well Prophylaxis: Lovenox SQ  Disposition: pending improvement  Subjective:  Patient had breakthrough seizures overnight: @1702 -1704 lasting 1.5 mins (at this time patient was awaiting additional Vimpat dose ordered by neurology earlier). @2124 -2124 seizure lasted 1 minute, with second seizure 2217-2218. MD recommended give ativan 1mg  if third seizure occurred.   Father at bedside this AM. Reports patient's last seizure was early this AM around 1AM. Father reports patient is more awake this AM and he had a little to drink.   Objective: Temp:  [98.8 F (37.1 C)-99.2 F (37.3 C)] 99.1 F (37.3 C) (06/09 0400) Pulse Rate:  [64-166] 69 (06/09 0400) Resp:  [11-31] 19 (06/09 0400) BP: (109-145)/(68-100) 121/75 mmHg (06/09 0400) SpO2:  [90 %-100 %] 99 % (06/09 0400) Physical Exam: General: Did open eyes spontaneously and looks around, seems a little more awake, nonverbal (baseline) Cardiovascular: mild tachycardia, no m/r/g  Respiratory: no increased wob, CTAB anteriorly and laterally Abdomen:  soft, NT, ND, + BS Extremities: no LE edema.   Laboratory:  Recent Labs Lab 04/23/16 0252 04/24/16 0435  WBC 13.9* 7.2  HGB 13.4 11.3*  HCT 42.3 35.9*  PLT 293 278    Recent Labs Lab 04/23/16 0252 04/24/16 0435 04/25/16 0206  NA 138 139 141  K 4.2 3.6 3.2*  CL 104 107 108  CO2 17* 24 22  BUN 8 <5* <5*  CREATININE 1.05 0.70 0.83  CALCIUM 10.0 9.2 9.0  PROT 8.1  --   --   BILITOT 0.5  --   --   ALKPHOS 71  --   --   ALT 10*  --   --   AST 26  --   --   GLUCOSE 115* 88 74   Lactic Acid 1.4 > 1.3 UA 6/7: 15 ketones, 30 protein   Imaging/Diagnostic Tests: CXR:  FINDINGS: The lungs are adequately inflated and clear. The heart and mediastinal structures are normal. There is no pleural effusion or pneumothorax. There is mild dextrocurvature centered in the mid thoracic spine.  There is moderate gaseous distention of the stomach as well as large bowel loops.  IMPRESSION: There is no active cardiopulmonary disease. Persistent gaseous distention of the stomach and visualized large bowel loops similar to that seen in 2014 and in 2005.  6/7: EEG EEG Abnormalities: 1) recurrent partial seizures arising from the left posterior quadrant 2) interictal periodic lateralized epileptiform discharges without associated fast activity(PLEDs proper) 3) generalized irregular slow activity 4) absent PDR  6/7 0348 - Head CT wo contrast IMPRESSION: 1. No acute intracranial process identified. 2. 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 3. 7 mm hypodensity within the left basal ganglia, which may reflect a small remote lacunar infarct versus dilated perivascular space.  6/7 0412 X-ray acute abdomen/chest IMPRESSION: 1. No evidence of acute cardiopulmonary disease. 2. Gaseous distention of colon which was also seen in 2006 and favors air swallowing or dysmotility. No suspected obstruction or Perforation.  6/7 ED EKG Sinus  tachycardia HR 140, QTc 452, no acute ST-T wave ischemic changes, no significant change since last  Palma Holter, MD 04/25/2016, 6:46 AM PGY-1, [facility] Family Medicine FPTS Intern pager: [phone], text pages welcome

## 2016-04-25 NOTE — Evaluation (Signed)
Clinical/Bedside Swallow Evaluation Patient Details  Name: Carlos [name] MRN: [id number] Date of Birth: 11/14/1986  Today's Date: 04/25/2016 Time: SLP Start Time (ACUTE ONLY): 0915 SLP Stop Time (ACUTE ONLY): 0940 SLP Time Calculation (min) (ACUTE ONLY): 25 min  Past Medical History:  Past Medical History  Diagnosis Date  . Groin abscess 03/02/2008  . Cerebral palsy (HCC)   . Seizures (HCC)   . Ventriculomegaly of brain, congenital (HCC)   . Agenesis of corpus callosum (HCC)   . Pneumonia   . Status epilepticus [facility](HCC)    Past Surgical History:  Past Surgical History  Procedure Laterality Date  . [place]   HPI:  30 year old male with PMH significant for CP, seizures, PNA, admitted 03/23/16 due to seizures. BSE ordered to evaluate swallow function and safety and to identify safest po diet.   Assessment / Plan / Recommendation Clinical Impression  Pt presents with low tone of oral musculature. Audible swallow noted with po presentations, which father reports is baseline for pt. Orally, pt presents with discoordination of musculature with anterior tongue holding and reduced mouth closure with poor oral secretion management. Cough response was noted following thin liquid, likely due to large consecutive boluses. No cough response was noted with nectar thick liquids, given at slow rate and in small boluses. Puree consistency was tolerated without overt s/s aspiration, but with poor oral coordination and reduced mouth closure as seen with thin liquids.   Of note, father reports pt does not like ice or very cold things (ice cream)    Aspiration Risk  Moderate aspiration risk    Diet Recommendation Nectar-thick liquid;Dysphagia 1 (Puree)   Liquid Administration via: Cup;No straw Medication Administration: Via alternative means (IV or crushed in puree) Supervision: Full supervision/cueing for compensatory strategies Compensations: Minimize environmental  distractions;Slow rate;Small sips/bites Postural Changes: Seated upright at 90 degrees;Remain upright for at least 30 minutes after po intake    Other  Recommendations Oral Care Recommendations: Oral care QID Other Recommendations: Have oral suction available   Follow up Recommendations   (TBD)    Frequency and Duration min 1 x/week  2 weeks       Prognosis Prognosis for Safe Diet Advancement: Fair Barriers to Reach Goals: Cognitive deficits;Severity of deficits      Swallow Study   General Date of Onset: 04/23/16 HPI: 30 year old male with PMH significant for CP, seizures, PNA, admitted 03/23/16 due to seizures. BSE ordered to evaluate swallow function and safety and to identify safest po diet. Type of Study: Bedside Swallow Evaluation Previous Swallow Assessment: none found Diet Prior to this Study: NPO Temperature Spikes Noted: No (100 6/8) Respiratory Status: Room air History of Recent Intubation: No Behavior/Cognition: Alert;Cooperative;Requires cueing;Doesn't follow directions Oral Cavity Assessment: Within Functional Limits Oral Care Completed by SLP: No (father completing oral care as slp entered room) Oral Cavity - Dentition: Adequate natural dentition Self-Feeding Abilities: Total assist Patient Positioning: Upright in bed Baseline Vocal Quality: Not observed Volitional Cough: Cognitively unable to elicit Volitional Swallow: Unable to elicit    Oral/Motor/Sensory Function Overall Oral Motor/Sensory Function:  (unable to assess. Pt appears to have low tone)   Ice Chips Ice chips: Not tested   Thin Liquid Thin Liquid: Impaired Presentation: Cup Oral Phase Impairments: Reduced labial seal;Reduced lingual movement/coordination Oral Phase Functional Implications: Left anterior spillage Pharyngeal  Phase Impairments: Cough - Immediate    Nectar Thick Nectar Thick Liquid: Within functional limits Presentation: Cup  Honey Thick Honey Thick Liquid: Not tested   Puree  Puree: Within functional limits Presentation: Spoon   Solid     Solid: Not tested       Leigh Aurora 04/25/2016,10:00 AM   Harlow Asa. Murvin Natal [facility], CCC-SLP [phone]

## 2016-04-26 LAB — BASIC METABOLIC PANEL
ANION GAP: 8 (ref 5–15)
BUN: <5 mg/dL — ABNORMAL LOW (ref 6–20)
CALCIUM: 9.3 mg/dL (ref 8.9–10.3)
CO2: 26 mmol/L (ref 22–32)
Chloride: 107 mmol/L (ref 101–111)
Creatinine, Ser: 0.72 mg/dL (ref 0.61–1.24)
GFR calc Af Amer: >60 mL/min (ref >60)
GFR calc non Af Amer: >60 mL/min (ref >60)
GLUCOSE: 99 mg/dL (ref 65–99)
Potassium: 3.3 mmol/L — ABNORMAL LOW (ref 3.5–5.1)
Sodium: 141 mmol/L (ref 135–145)

## 2016-04-26 LAB — MAGNESIUM: Magnesium: 1.8 mg/dL (ref 1.7–2.4)

## 2016-04-26 MED ORDER — LACOSAMIDE 50 MG PO TABS: 150.0000 mg | ORAL_TABLET | Freq: Two times a day (BID) | ORAL | Status: DC

## 2016-04-26 MED ORDER — LEVETIRACETAM 750 MG PO TABS
750.0000 mg | ORAL_TABLET | Freq: Two times a day (BID) | ORAL | Status: DC
  Administered 2016-04-26: 750 mg via ORAL
  Filled 2016-04-26: qty 1

## 2016-04-26 MED ORDER — DIAZEPAM 2.5 MG RE GEL: 2.5000 mg | RECTAL | Status: DC | PRN

## 2016-04-26 MED ORDER — LEVETIRACETAM 750 MG PO TABS: 750.0000 mg | ORAL_TABLET | Freq: Two times a day (BID) | ORAL | Status: DC

## 2016-04-26 MED ORDER — LACOSAMIDE 50 MG PO TABS: 200.0000 mg | ORAL_TABLET | Freq: Two times a day (BID) | ORAL | Status: DC

## 2016-04-26 MED ORDER — LACOSAMIDE 150 MG PO TABS: 150.0000 mg | ORAL_TABLET | Freq: Two times a day (BID) | ORAL | Status: DC

## 2016-04-26 MED ORDER — LACOSAMIDE 50 MG PO TABS
150.0000 mg | ORAL_TABLET | Freq: Two times a day (BID) | ORAL | Status: DC
  Administered 2016-04-26: 150 mg via ORAL
  Filled 2016-04-26: qty 3

## 2016-04-26 MED ORDER — RESOURCE THICKENUP CLEAR PO POWD
ORAL | Status: DC | PRN
  Filled 2016-04-26: qty 125

## 2016-04-26 NOTE — Discharge Instructions (Signed)
Please follow up with Dr. Sharene SkeansHickling in one month.   Please follow up with his primary doctor in the family medicine center within the next week after discharge.

## 2016-04-26 NOTE — Progress Notes (Signed)
Family Medicine Teaching Service Daily Progress Note Intern Pager: [phone]  Patient name: Carlos [name] Medical record number: [id number] Date of birth: Aug 18, 1986 Age: 30 y.o. Gender: male  Primary Care Provider: Jacquelin Hawking, MD Consultants: Neuro Code Status: FULL  Pt Overview and Major Events to Date:  6/7: admit for seizures, breakthrough:  S/p Ativan, loaded with Keppra in ED; EEG abnormal 6/8: Continued seizures, additional Keppra load and increased maintenance dose, added Vimpat after load  Assessment and Plan: Carlos [name] is a 30 y.o. male presenting with recurrent breakthrough generalized seizures. 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 No recent changes, had been tolerating PO well previously.  - SLP consulted: nectar thick dysphagia 1  Constipation, chronic - Stable Last BM 6/7. No recent problems. - home Miralax 17g BID   MSK: Left hip subluxation - Stable Chronic problem recently followed by [facility], no history of fracture or surgery. Reportedly due to muscle deformity, has been source of pain previously, no recent changes. - Tylenol liquid  q 6 hr PRN  Hypomagnesemia: improved.  - check daily  Hypokalemia: 3.2 this AM.  - IV Potassium x 4 each administered over 1 hour  FEN/GI: Nectar Thick  dysphagia 1, NS 125cc/hr until PO well Prophylaxis: Lovenox SQ  Disposition: pending improvement  Subjective:  His right arm seems larger than the day before. Nurse reports he did have an infiltrate. There is no erythema or suggestion of infection.   Objective: Temp:  [98.2 F (36.8 C)-99.4 F (37.4 C)] 99 F (37.2 C) (06/10 0349) Pulse Rate:  [83-127] 83 (06/10 0349) Resp:  [10-21] 10 (06/09 1500) BP: (123-144)/(81-99) 123/84 mmHg (06/10 0349) SpO2:  [99 %-100 %] 100 % (06/10 0349) Physical Exam: General: Did open eyes spontaneously and looks around, seems a little more awake, nonverbal (baseline) Cardiovascular: mild tachycardia, no m/r/g Respiratory: no increased wob, CTAB anteriorly and laterally Abdomen: soft, NT, ND, + BS Extremities: no LE edema.   Laboratory:  Recent Labs Lab 04/23/16 0252 04/24/16 0435  WBC 13.9* 7.2  HGB 13.4 11.3*  HCT 42.3 35.9*  PLT 293 278    Recent Labs Lab 04/23/16 0252 04/24/16 0435 04/25/16 0206 04/26/16 0414  NA 138 139 141 141  K 4.2 3.6 3.2* 3.3*  CL 104 107 108 107  CO2 17* BUN 8 <5* <5* <5*  CREATININE 1.05 0.70 0.83 0.72  CALCIUM 10.0 9.2 9.0 9.3  PROT 8.1  --   --   --   BILITOT 0.5  --   --   --   ALKPHOS 71  --   --   --   ALT 10*  --   --   --   AST 26  --   --   --   GLUCOSE 115* 88 74 99   Lactic Acid 1.4 > 1.3 UA 6/7: 15 ketones, 30 protein   Imaging/Diagnostic Tests: CXR:  IMPRESSION:  There is no active cardiopulmonary disease. Persistent gaseous distention of the stomach and visualized large bowel loops similar to that seen in 2014 and in 2005.  6/7: EEG EEG Abnormalities: 1) recurrent partial seizures arising from the left posterior quadrant 2) interictal periodic lateralized epileptiform discharges without associated fast activity(PLEDs proper) 3) generalized irregular slow activity 4) absent PDR  6/7 0348 - Head CT wo contrast IMPRESSION: 1. No acute intracranial process  identified. 2. 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 3. 7 mm hypodensity within the left basal ganglia, which may reflect a small remote lacunar infarct versus dilated perivascular space.  6/7 0412 X-ray acute abdomen/chest IMPRESSION: 1. No evidence of acute cardiopulmonary disease. 2. Gaseous distention of colon which was also seen in 2006 and favors air swallowing or dysmotility. No suspected obstruction or Perforation.  6/7 ED EKG Sinus tachycardia HR 140, QTc 452, no acute ST-T wave ischemic changes, no significant change since last  Myra RudeJeremy E Philisha Weinel, MD 04/26/2016, 8:22 AM PGY-3, [facility] FPTS Intern pager: [phone], text pages welcome

## 2016-04-26 NOTE — Progress Notes (Signed)
Subjective: Patient back to baseline per mother.   Exam: Filed Vitals:   04/25/16 2345 04/26/16 0349  BP: 129/84 123/84  Pulse: 83 83  Temp: 99.2 F (37.3 C) 99 F (37.2 C)  Resp:     Gen: In bed, NAD Resp: non-labored breathing, no acute distress Abd: soft, nt  Neuro: MS: awake, moving in bed, appears to fixate and track some.  ZO:XWRUECN:PERRL Motor: spastic quadripareiss, moves arms more than legs.   Pertinent Labs: nml Cr  Impression: 30 yo M with severe intellectual disability and epilepsy who presents with recurrent seizures. EEG demonstrated recurrent partial seizures with clinical correlate of eye blinking, head turning arising from the left posterior quadrant. I suyspect he may have had a mild viral illness prompting seizure flurry given a fever on HOD#2.   At this time, he is doing well with no further seizures and would discharge on current therapy.   Recommendations: 1) Would discharge on keppra 750mg  BID, lamictal 225mg  BID, and vimpat 150mg  BID 2) f/u within one month with Dr. Sharene SkeansHickling.  3) Please call with any further questions or concerns.   Ritta SlotMcNeill Divine Hansley, MD [facility] [phone]  If 7pm- 7am, please page neurology on call as listed in AMION.

## 2016-04-26 NOTE — Discharge Summary (Signed)
Family Medicine Teaching [facility] Discharge Summary  Patient name: Carlos [name] Medical record number: [id number] Date of birth: [birth year] Age: 30 y.o. 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   Brief Hospital Course:  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   On admission, Parents reported symptoms started with breakthrough seizures starting at 0530 04/22/16, initial seizure lasted 1-2 min, no PRN medication given, seemed typical seizure of his with grand mal or generalized tonic-clonic episode, he was post-ictal or drowsy after which is also normal. Later in the day he developed more recurrent breakthrough seizures, up to a frequency of 1 seizure q 1 hour in the afternoon that prompted them to seek immediate medical attention in ED. Previous seizure history normally with similar breakthrough seizures about 1 x weekly for several months now, without worsening. Denied new medications or changes. He is followed by Dr. Sharene Skeans, last seen 02/2016. Parents denied recent illness.   On admission, he was clinically well appearing and afebrile,  tachycardic but otherise hemodynamically stable. CXR and UA were unremarkable. Initial CBC did show leukocytosis which quickly resolved and was thought to be due to de-margination in the setting of seizure. Blood cultures were still obtained although infection was low on the differential. CT head showed no acute findings (stable agensis of corpus callosum). Neurology was consulted. Neurology suspected these symptoms were possibly due to mild viral illness given he had a low grade fever on HOD #2. His EEG showed recurrent partial seizures arising from the left posterior quadrant,  interictal periodic lateralized epileptiform discharges without associated fast activity(PLEDs proper), generalized irregular slow activity, and absent PDR. He continued to have seizures while in the hospital initially, and his medication regimen was changed accordingly. Repeat Head CT was unchanged. He was stable on the following regimen prior to discharge: Keppra 750mg  BID, Limictal 225mg  BID, and Vimpat 150mg  BID. Of note, his Keppra dose was increased and Vimpat was started during this hospitalization. Patient to follow up with Dr. Sharene Skeans in 1 month.   Dysphagia in the setting of congenital cerebral palsy: Speech was consulted and recommended nectar thick dysphagia 1 diet.   He was noted to have hypokalemia and hypomagnesemia which was repleted during hospitalization. Etiology of this is unclear.     Issues for Follow Up:  1) Would discharge on keppra 750mg  BID, lamictal 225mg  BID, and vimpat 150mg  BID 2) f/u within one month with Dr. Sharene Skeans 3) consider re-checking electrolytes   Significant Procedures: EEG  Significant Labs and Imaging:   Recent Labs Lab 04/23/16 0252 04/24/16 0435  WBC 13.9* 7.2  HGB 13.4 11.3*  HCT 42.3 35.9*  PLT 293 278    Recent Labs Lab 04/23/16 0252 04/23/16 0856 04/24/16 0435 04/25/16 0206 04/25/16 0716 04/26/16 0414  NA 138  --  139 141  --  141  K 4.2  --  3.6 3.2*  --   3.3*  CL 104  --  107 108  --  107  CO2 17*  --  24 22  --  26  GLUCOSE 115*  --  88 74  --  99  BUN 8  --  <5* <5*  --  <5*  CREATININE 1.05  --  0.70 0.83  --  0.72  CALCIUM 10.0  --  9.2 9.0  --  9.3  MG  --  1.6*  --   --  1.7 1.8  ALKPHOS 71  --   --   --   --   --   AST 26  --   --   --   --   --   ALT 10*  --   --   --   --   --   ALBUMIN 4.1  --   --   --   --   --    Magnesium: 1.6 > 1.7 > 1.8 Blood Cultures:  6/7: NG x 3 days  6/9: NG x 1 day  Lamotrigine Level: 5.2 Kepra Level: 4.4  CT Head 04/24/16: FINDINGS: Stable and well pneumatized visualized paranasal sinuses and mastoids. No osseous abnormality. Visualized orbit soft tissues are within normal limits.  There are 2 small right convexity scalp hematemesis up there are 2 small right convexity scalp lipoma is incidentally noted. There is a small left scalp lipoma (series 3, image 46).  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 Third and fourth ventricle size is relatively normal. Cerebellum appears relatively normal. No evidence of cerebral edema or acute intracranial mass effect. No acute intracranial hemorrhage identified. No cortically based acute infarct identified.  IMPRESSION: 1. Stable noncontrast CT appearance of the brain with no acute findings. Dysgenesis of the corpus callosum, colpocephaly, and significant bilateral temporal lobe atrophy re-demonstrated. 2. Several incidental small scalp lipomas.  CXR:  IMPRESSION: There is no active cardiopulmonary disease. Persistent gaseous distention of the stomach and visualized large bowel loops similar to that seen in 2014 and in 2005.  6/7: EEG EEG Abnormalities: 1) recurrent partial seizures arising from the left posterior quadrant 2) interictal periodic lateralized epileptiform discharges without associated fast activity(PLEDs proper) 3) generalized irregular slow activity 4)  absent PDR  6/7 0348 - Head CT wo contrast IMPRESSION: 1. No acute intracranial process identified. 2. 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 3. 7 mm hypodensity within the left basal ganglia, which may reflect a small remote lacunar infarct versus dilated perivascular space.  6/7 0412 X-ray acute abdomen/chest IMPRESSION: 1. No evidence of acute cardiopulmonary disease. 2. Gaseous distention of colon which was also seen in 2006 and favors air swallowing or dysmotility. No suspected obstruction or Perforation.  6/7 ED EKG Sinus tachycardia HR 140, QTc 452, no acute ST-T wave ischemic changes, no significant change since last  Results/Tests Pending at Time of Discharge: Blood Culture (final read)  Discharge Medications:    Medication List    TAKE these medications        cetirizine HCl 5 MG/5ML Syrp  Commonly known as:  Zyrtec  Take 10 mLs (10 mg total) by mouth daily.     diazepam 2.5 MG Gel  Commonly known as:  DIASTAT  Place 2.5 mg rectally as needed for seizure.     Lacosamide 150 MG Tabs  Take 1 tablet (150 mg total) by mouth  2 (two) times daily.     LAMICTAL 200 MG tablet  Generic drug:  lamoTRIgine  Take 1 tablet (200 mg total) by mouth 2 (two) times daily.     LAMICTAL 25 MG tablet  Generic drug:  lamoTRIgine  Take 1 tablet (25 mg total) by mouth 2 (two) times daily.     levETIRAcetam 750 MG tablet  Commonly known as:  KEPPRA  Take 1 tablet (750 mg total) by mouth 2 (two) times daily.     polyethylene glycol powder powder  Commonly known as:  GLYCOLAX/MIRALAX  DISSOLVE ONE CAPFUL IN 8 0Z. WATER TWICE DAILY     sodium phosphate Pediatric 3.5-9.5 GM/59ML enema  Place 1 enema rectally every three (3) days as needed for constipation.        Discharge Instructions: Please refer to Patient Instructions section of EMR for full details.  Patient was counseled important signs and symptoms that should prompt  return to medical care, changes in medications, dietary instructions, activity restrictions, and follow up appointments.   Follow-Up Appointments: Follow-up Information    Follow up with Jacquelin Hawking, MD. Schedule an appointment as soon as possible for a visit in 3 days.   Specialty:  Family Medicine   Why:  hospital follow up   Contact information:   [address] [place] [address] [phone]       Follow up with Deetta Perla, MD. Schedule an appointment as soon as possible for a visit in 1 month.   Specialties:  Pediatrics, Radiology   Why:  hospital follow up   Contact information:   [address] [place] [address] [phone]       Palma Holter, MD 04/27/2016, 12:45 AM PGY-1, [facility]

## 2016-04-26 NOTE — Progress Notes (Signed)
MD notified of patients IV inflitrated. No IV access available. MD made aware of seizures during admission. Ok to leave IV out at this time. Family did not want IV restart. Possible discharge 04/26/16. Medications being  converted to PO. [facility]

## 2016-04-28 ENCOUNTER — Other Ambulatory Visit: Payer: Self-pay | Admitting: Family

## 2016-04-28 DIAGNOSIS — G40319 Generalized idiopathic epilepsy and epileptic syndromes, intractable, without status epilepticus: Secondary | ICD-10-CM

## 2016-04-28 DIAGNOSIS — G809 Cerebral palsy, unspecified: Secondary | ICD-10-CM

## 2016-04-28 LAB — CULTURE, BLOOD (ROUTINE X 2)
Culture: NO GROWTH
Culture: NO GROWTH

## 2016-04-28 MED ORDER — KEPPRA 750 MG PO TABS
ORAL_TABLET | ORAL | Status: DC
Start: 2016-04-28 — End: 2016-08-21

## 2016-04-28 NOTE — Telephone Encounter (Signed)
Rx faxed to pharmacy as requested. TG 

## 2016-04-30 LAB — CULTURE, BLOOD (ROUTINE X 2)
CULTURE: NO GROWTH
CULTURE: NO GROWTH

## 2016-05-12 ENCOUNTER — Other Ambulatory Visit: Payer: Self-pay | Admitting: Family Medicine

## 2016-05-21 ENCOUNTER — Ambulatory Visit (INDEPENDENT_AMBULATORY_CARE_PROVIDER_SITE_OTHER): Admitting: Family

## 2016-05-21 ENCOUNTER — Encounter: Payer: Self-pay | Admitting: Family

## 2016-05-21 VITALS — BP 106/74 | HR 82 | Ht 60.0 in | Wt 105.0 lb

## 2016-05-21 DIAGNOSIS — G40219 Localization-related (focal) (partial) symptomatic epilepsy and epileptic syndromes with complex partial seizures, intractable, without status epilepticus: Secondary | ICD-10-CM

## 2016-05-21 DIAGNOSIS — K5900 Constipation, unspecified: Secondary | ICD-10-CM | POA: Diagnosis not present

## 2016-05-21 DIAGNOSIS — G808 Other cerebral palsy: Secondary | ICD-10-CM | POA: Diagnosis not present

## 2016-05-21 DIAGNOSIS — F819 Developmental disorder of scholastic skills, unspecified: Secondary | ICD-10-CM | POA: Diagnosis not present

## 2016-05-21 DIAGNOSIS — G40319 Generalized idiopathic epilepsy and epileptic syndromes, intractable, without status epilepticus: Secondary | ICD-10-CM

## 2016-05-21 MED ORDER — VIMPAT 100 MG PO TABS: ORAL_TABLET | ORAL | Status: DC

## 2016-05-21 MED ORDER — POLYETHYLENE GLYCOL 3350 17 GM/SCOOP PO POWD: ORAL | Status: DC
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
## 2016-05-21 NOTE — Patient Instructions (Addendum)
I have changed the Vimpat to 100mg  tablets. Give him 1 tablet in the morning and 2 tablets at night. This is the same dose per 24 hours, but a smaller dose in the morning to see if that helps with the sleepiness.   Continue the Keppra and Lamotrigine as you have been giving it.   Call the office if Carlos [name] continues to be excessively sleepy in the mornings. Give it about a week after switching to the new morning dose.   Call the office if Carlos [name] has increase in seizure activity.   I refilled the generic Miralax.   For his right upper arm pain, give him Children's Advil/Motrin/Ibuprofen 100mg  tablets - 4 tablets as needed for pain.  I would also recommend putting some hot moist packs on his upper arm daily for about 20 minutes. Do this for a week. Let me know if he continues to have pain in his upper arm.   Please plan to return for follow up in 3 months or sooner if needed.

## 2016-07-31 ENCOUNTER — Telehealth: Payer: Self-pay | Admitting: Family Medicine

## 2016-07-31 NOTE — Telephone Encounter (Signed)
Care Coordinator Maudie FlakesKochema Miller  dropped off certification of medical necessity  form at front desk for completion.  Verified that patient section of form has been completed.  Last DOS/WCC with PCP was 07/24/15.  Placed form in white team folder to be completed by clinical staff.  Lina Sarheryl A Stanley

## 2016-08-01 NOTE — Telephone Encounter (Signed)
Clinical info completed on Carlos YelvingtonStover form.  Place form in Dr. Kristopher OppenheimWarden's box for completion.  Lamonte SakaiZimmerman Rumple, April D, [place]

## 2016-08-05 ENCOUNTER — Ambulatory Visit (INDEPENDENT_AMBULATORY_CARE_PROVIDER_SITE_OTHER): Admitting: Internal Medicine

## 2016-08-05 ENCOUNTER — Encounter: Payer: Self-pay | Admitting: Internal Medicine

## 2016-08-05 VITALS — Temp 97.7°F

## 2016-08-05 DIAGNOSIS — L0201 Cutaneous abscess of face: Secondary | ICD-10-CM | POA: Diagnosis not present

## 2016-08-05 NOTE — Patient Instructions (Signed)
Please continue to do warm compresses to the area. Please return to clinic if symptoms worsen despite this.

## 2016-08-05 NOTE — Progress Notes (Signed)
   [facility] Phone: [phone]   Date of Visit: 08/05/2016   HPI:  Lou CalQuintin Maclellan is a 30 y.o. male presenting to clinic today for same day appointment. PCP: Renne Muscaaniel L Warden, MD Concerns today include: possible insect bite over eye. Patient present with mother and father. Patient is nonverbal; has history of CP - parents noticed Sunday morning (did not notice the night before) - noticed that it has gotten possibly a little bigger since then - no fevers  - has tried warm rags with salt, alcohol swab without much change - no trauma to that area  ROS: See HPI.  PMFSH:  Cerebral Palsy  PHYSICAL EXAM: Temp 97.7 F (36.5 C) (Axillary)  GEN: NAD, nonverbal SKIN: Above right eyebrow- ~1cm in diameter indurated lesion that is mobile and without fluctuance. Very minimal erythema only overlying the induration. No swelling around the eye or around the lesion.    ASSESSMENT/PLAN: Early Small Cutaneous Abscess vs Insect Bite, Above R eyebrow: There is no area of fluctuance in the lesion on exam. No sign of significant cellulitis of the area. Discussed with parents about watchful waiting with continued warm compresses vs antibiotic and warm compresses. Decided on monitoring along with warm compresses.  - return precautions discussed.    Palma HolterKanishka G Rejeana Fadness, MD PGY 2 [facility]

## 2016-08-07 ENCOUNTER — Telehealth: Payer: Self-pay

## 2016-08-07 NOTE — Telephone Encounter (Signed)
Covering Dr. Kristopher OppenheimWarden's inbox. Received form for new bed padding and new floor mat. Signed this form, and will place it back in Carlos [name]'s box this afternoon. Thanks!

## 2016-08-07 NOTE — Telephone Encounter (Signed)
error 

## 2016-08-12 ENCOUNTER — Encounter: Admitting: Family Medicine

## 2016-08-14 ENCOUNTER — Encounter: Payer: Self-pay | Admitting: Family Medicine

## 2016-08-14 ENCOUNTER — Ambulatory Visit (INDEPENDENT_AMBULATORY_CARE_PROVIDER_SITE_OTHER): Admitting: Family Medicine

## 2016-08-14 VITALS — BP 120/86 | HR 152 | Temp 97.9°F | Wt 107.0 lb

## 2016-08-14 DIAGNOSIS — Z0000 Encounter for general adult medical examination without abnormal findings: Secondary | ICD-10-CM

## 2016-08-14 DIAGNOSIS — G809 Cerebral palsy, unspecified: Secondary | ICD-10-CM | POA: Diagnosis not present

## 2016-08-14 DIAGNOSIS — B350 Tinea barbae and tinea capitis: Secondary | ICD-10-CM

## 2016-08-14 DIAGNOSIS — L738 Other specified follicular disorders: Secondary | ICD-10-CM

## 2016-08-14 NOTE — Assessment & Plan Note (Signed)
Patient here today for a health maintenance exam and is well appearing.  Aside from some blackheads in his ears, mom and dad have no concerns.  I have been asked to refer patient to dermatology for an exam, as well as PT for equipment sign off.  Parents declined flu-shot today.  - Will complete paperwork and call parents to pick up  - Return for physical exam in 1 year

## 2016-08-14 NOTE — Progress Notes (Signed)
    Subjective:  Carlos [name] is a 30 y.o. male who presents to [facility] today for a physical exam  HPI: Patient is here today for a check up and is  accompanied by his parents.  Patient has cerebral palsy and is non-verbal.  In June he had a hospitalization for multiple seizures and has had continued follow up with his neurologist, Dr. Sharene SkeansHickling and has been seizure free.  His next visit is in October.  Parents have noticed some black heads in his ears that have been bothering him.  They have tried using noxzema pads that have been ineffective.  Mom requested a dermatology referral to her dermatologist who [facility].  They also requested a PT referral to be able to get insurance coverage for a new wheelchair and safety pads for the floor at home as well as paperwork for his handicap hanger.  [facility] and had a deep cleaning. They have no other concerns.   PMH: infantile cerebral palsy ROS: see HPI  Objective:  Physical Exam: BP 120/86   Pulse (!) 152   Temp 97.9 F (36.6 C) (Oral)   Wt 107 lb (48.5 kg)   BMI 20.90 kg/m   Gen: [age] M with CP in NAD, resting comfortably CV: RRR with no murmurs appreciated Pulm: NWOB, CTAB with no crackles, wheezes, or rhonchi GI: Normal bowel sounds present. Soft, Nontender, Nondistended. MSK: Spastic quadripareiss, moves arms more than legs.  Skin: warm, dry Neuro: grossly normal, moves all extremities Psych: Normal affect and thought content  No results found for this or any previous visit (from the past 72 hour(s)).   Assessment/Plan:  Health maintenance examination Patient here today for a health maintenance exam and is well appearing.  Aside from some blackheads in his ears, mom and dad have no concerns.  I have been asked to refer patient to dermatology for an exam, as well as PT for equipment sign off.  Parents declined flu-shot today.  - Will complete paperwork and call parents to pick up  - Return for  physical exam in 1 year

## 2016-08-14 NOTE — Patient Instructions (Addendum)
[name] was seen today for a check up and I have no concerns at the moment.  I am glad that he is doing much better after his hospitalization in June.   I have sent in referrals to physical therapy and Dr. Amy SwazilandJordan in Dermatology.  I will also finish up his paper work and get those to you as soon as I can.   You can try over the counter benzoyl peroxide for the blackheads in his ears and if that doesn't work we can try some retinoic acid (vitamin A) products.   It was a pleasure meeting you all.  Take care, Carlos Heyden L. Myrtie SomanWarden, MD [facility] Resident PGY-1 08/14/2016 10:07 AM

## 2016-08-20 ENCOUNTER — Encounter (INDEPENDENT_AMBULATORY_CARE_PROVIDER_SITE_OTHER): Payer: Self-pay | Admitting: Family

## 2016-08-21 ENCOUNTER — Encounter (INDEPENDENT_AMBULATORY_CARE_PROVIDER_SITE_OTHER): Payer: Self-pay | Admitting: Family

## 2016-08-21 ENCOUNTER — Ambulatory Visit (INDEPENDENT_AMBULATORY_CARE_PROVIDER_SITE_OTHER): Admitting: Family

## 2016-08-21 VITALS — BP 120/80 | HR 96 | Ht 60.0 in | Wt 107.0 lb

## 2016-08-21 DIAGNOSIS — G40319 Generalized idiopathic epilepsy and epileptic syndromes, intractable, without status epilepticus: Secondary | ICD-10-CM | POA: Diagnosis not present

## 2016-08-21 DIAGNOSIS — G808 Other cerebral palsy: Secondary | ICD-10-CM | POA: Diagnosis not present

## 2016-08-21 DIAGNOSIS — F8189 Other developmental disorders of scholastic skills: Secondary | ICD-10-CM | POA: Diagnosis not present

## 2016-08-21 DIAGNOSIS — G40219 Localization-related (focal) (partial) symptomatic epilepsy and epileptic syndromes with complex partial seizures, intractable, without status epilepticus: Secondary | ICD-10-CM | POA: Diagnosis not present

## 2016-08-21 DIAGNOSIS — G809 Cerebral palsy, unspecified: Secondary | ICD-10-CM

## 2016-08-21 DIAGNOSIS — G40209 Localization-related (focal) (partial) symptomatic epilepsy and epileptic syndromes with complex partial seizures, not intractable, without status epilepticus: Secondary | ICD-10-CM

## 2016-08-21 DIAGNOSIS — F819 Developmental disorder of scholastic skills, unspecified: Secondary | ICD-10-CM

## 2016-08-21 MED ORDER — VIMPAT 100 MG PO TABS: ORAL_TABLET | ORAL | 5 refills | Status: DC

## 2016-08-21 MED ORDER — LAMICTAL 25 MG PO TABS: ORAL_TABLET | ORAL | 5 refills | Status: DC

## 2016-08-21 MED ORDER — KEPPRA 750 MG PO TABS: ORAL_TABLET | ORAL | 5 refills | Status: DC

## 2016-08-21 MED ORDER — LAMICTAL 200 MG PO TABS: ORAL_TABLET | ORAL | 5 refills | Status: DC
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
## 2016-08-21 NOTE — Patient Instructions (Signed)
I have given you an order for a new stroller for [name]. Let me know if you need anything else for the agency to supply this for him.   I have given you new prescriptions for his seizure medicines.   Let me know if he has any seizures that are back to back, last longer than usual or are different than usual.   Please plan to return for follow up in 6 months or sooner if needed.

## 2016-11-01 ENCOUNTER — Other Ambulatory Visit (INDEPENDENT_AMBULATORY_CARE_PROVIDER_SITE_OTHER): Payer: Self-pay | Admitting: Family

## 2016-11-01 DIAGNOSIS — G40219 Localization-related (focal) (partial) symptomatic epilepsy and epileptic syndromes with complex partial seizures, intractable, without status epilepticus: Secondary | ICD-10-CM

## 2016-11-01 DIAGNOSIS — G40319 Generalized idiopathic epilepsy and epileptic syndromes, intractable, without status epilepticus: Secondary | ICD-10-CM

## 2017-03-06 ENCOUNTER — Other Ambulatory Visit (INDEPENDENT_AMBULATORY_CARE_PROVIDER_SITE_OTHER): Payer: Self-pay | Admitting: Family

## 2017-03-06 DIAGNOSIS — G40319 Generalized idiopathic epilepsy and epileptic syndromes, intractable, without status epilepticus: Secondary | ICD-10-CM

## 2017-03-06 DIAGNOSIS — G40219 Localization-related (focal) (partial) symptomatic epilepsy and epileptic syndromes with complex partial seizures, intractable, without status epilepticus: Secondary | ICD-10-CM

## 2017-03-06 DIAGNOSIS — G40209 Localization-related (focal) (partial) symptomatic epilepsy and epileptic syndromes with complex partial seizures, not intractable, without status epilepticus: Secondary | ICD-10-CM

## 2017-03-06 MED ORDER — LAMICTAL 200 MG PO TABS: ORAL_TABLET | ORAL | 1 refills | Status: DC

## 2017-03-06 MED ORDER — LAMICTAL 25 MG PO TABS: ORAL_TABLET | ORAL | 1 refills | Status: DC

## 2017-03-06 MED ORDER — VIMPAT 100 MG PO TABS: ORAL_TABLET | ORAL | 1 refills | Status: DC

## 2017-03-09 ENCOUNTER — Telehealth (INDEPENDENT_AMBULATORY_CARE_PROVIDER_SITE_OTHER): Payer: Self-pay | Admitting: Family

## 2017-03-09 NOTE — Telephone Encounter (Signed)
LVM to CB for a fu 1 yr appt

## 2017-03-09 NOTE — Telephone Encounter (Signed)
-----   Message from Elveria Rising, NP sent at 03/06/2017 11:41 AM EDT ----- Regarding: Needs appointment Kyair needs an appointment with me.  Thanks, [name]

## 2017-03-16 ENCOUNTER — Other Ambulatory Visit (INDEPENDENT_AMBULATORY_CARE_PROVIDER_SITE_OTHER): Payer: Self-pay | Admitting: Family

## 2017-03-16 DIAGNOSIS — K5900 Constipation, unspecified: Secondary | ICD-10-CM

## 2017-04-06 ENCOUNTER — Ambulatory Visit (INDEPENDENT_AMBULATORY_CARE_PROVIDER_SITE_OTHER): Admitting: Family

## 2017-04-06 ENCOUNTER — Encounter (INDEPENDENT_AMBULATORY_CARE_PROVIDER_SITE_OTHER): Payer: Self-pay | Admitting: Family

## 2017-04-06 VITALS — BP 120/76 | HR 80

## 2017-04-06 DIAGNOSIS — G40209 Localization-related (focal) (partial) symptomatic epilepsy and epileptic syndromes with complex partial seizures, not intractable, without status epilepticus: Secondary | ICD-10-CM

## 2017-04-06 DIAGNOSIS — G40319 Generalized idiopathic epilepsy and epileptic syndromes, intractable, without status epilepticus: Secondary | ICD-10-CM

## 2017-04-06 DIAGNOSIS — G40219 Localization-related (focal) (partial) symptomatic epilepsy and epileptic syndromes with complex partial seizures, intractable, without status epilepticus: Secondary | ICD-10-CM | POA: Diagnosis not present

## 2017-04-06 DIAGNOSIS — Z79899 Other long term (current) drug therapy: Secondary | ICD-10-CM

## 2017-04-06 DIAGNOSIS — G808 Other cerebral palsy: Secondary | ICD-10-CM

## 2017-04-06 LAB — CBC WITH DIFFERENTIAL/PLATELET
BASOS ABS: 0 {cells}/uL (ref 0–200)
Basophils Relative: 0 %
EOS PCT: 4 %
Eosinophils Absolute: 188 cells/uL (ref 15–500)
HCT: 41.9 % (ref 38.5–50.0)
HEMOGLOBIN: 13.2 g/dL (ref 13.2–17.1)
LYMPHS ABS: 1598 {cells}/uL (ref 850–3900)
Lymphocytes Relative: 34 %
MCH: 24.1 pg — AB (ref 27.0–33.0)
MCHC: 31.5 g/dL — AB (ref 32.0–36.0)
MCV: 76.6 fL — ABNORMAL LOW (ref 80.0–100.0)
MPV: 9.3 fL (ref 7.5–12.5)
Monocytes Absolute: 423 cells/uL (ref 200–950)
Monocytes Relative: 9 %
NEUTROS PCT: 53 %
Neutro Abs: 2491 cells/uL (ref 1500–7800)
Platelets: 300 10*3/uL (ref 140–400)
RBC: 5.47 MIL/uL (ref 4.20–5.80)
RDW: 15.6 % — ABNORMAL HIGH (ref 11.0–15.0)
WBC: 4.7 10*3/uL (ref 3.8–10.8)

## 2017-04-06 MED ORDER — LAMICTAL 25 MG PO TABS: ORAL_TABLET | ORAL | 5 refills | Status: DC

## 2017-04-06 MED ORDER — LAMICTAL 200 MG PO TABS: ORAL_TABLET | ORAL | 5 refills | Status: DC

## 2017-04-06 MED ORDER — KEPPRA 750 MG PO TABS: ORAL_TABLET | ORAL | 5 refills | Status: DC

## 2017-04-06 MED ORDER — VIMPAT 100 MG PO TABS: ORAL_TABLET | ORAL | 5 refills | Status: DC
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
## 2017-04-06 NOTE — Patient Instructions (Signed)
I have given you orders for Carlos [name] to have blood drawn to check his blood counts, liver and kidneys. [facility] [address] to have the blood drawn. I will call you when I receive the results.   I have referred Carlos [name] to [facility] at [facility] for his left hand and wrist. You may call them at [phone] to schedule the appointment if you have not heard from them in a few days.   I have refilled Carlos [name]'s medications.   Please plan to return for follow up in 6 months or sooner if needed.

## 2017-04-07 ENCOUNTER — Telehealth (INDEPENDENT_AMBULATORY_CARE_PROVIDER_SITE_OTHER): Payer: Self-pay | Admitting: Family

## 2017-04-07 LAB — BASIC METABOLIC PANEL
BUN: 13 mg/dL (ref 7–25)
CALCIUM: 9.8 mg/dL (ref 8.6–10.3)
CO2: 22 mmol/L (ref 20–31)
CREATININE: 0.88 mg/dL (ref 0.60–1.35)
Chloride: 105 mmol/L (ref 98–110)
GLUCOSE: 73 mg/dL (ref 70–99)
Potassium: 4.6 mmol/L (ref 3.5–5.3)
Sodium: 142 mmol/L (ref 135–146)

## 2017-04-07 LAB — ALT: ALT: 7 U/L — AB (ref 9–46)

## 2017-04-07 NOTE — Telephone Encounter (Signed)
Mom called back and I reviewed the lab results from yesterday with her. I reassured her that Carlos [name]'s liver and kidneys were functioning well at this time. Mom had no further questions. TG

## 2017-04-07 NOTE — Telephone Encounter (Signed)
I called Mom and left a message asking her to call me back so that I can review his lab results with her. TG

## 2017-04-27 ENCOUNTER — Encounter: Payer: Self-pay | Admitting: *Deleted

## 2017-04-27 ENCOUNTER — Ambulatory Visit: Attending: Adult Health | Admitting: *Deleted

## 2017-04-27 DIAGNOSIS — G808 Other cerebral palsy: Secondary | ICD-10-CM | POA: Diagnosis present

## 2017-04-27 DIAGNOSIS — G8114 Spastic hemiplegia affecting left nondominant side: Secondary | ICD-10-CM | POA: Diagnosis present

## 2017-04-27 NOTE — Therapy (Signed)
[facility] [address] [place], [place], [address] Phone: [phone]   Fax:  [phone]  Occupational Therapy Evaluation  Patient Details  Name: Carlos [name] MRN: [id number] Date of Birth: June 28, 1986 Referring Provider: Elveria Rising, NP  Encounter Date: 04/27/2017      OT End of Session - 04/27/17 1059    Visit Number 1   Number of Visits 4   Date for OT Re-Evaluation 06/22/17   Authorization Type Primary = Tricare - needs G-Code. Secondary = Medicaid   OT Start Time [phone]   OT Stop Time 1022   OT Time Calculation (min) 53 min   Activity Tolerance Patient tolerated treatment well   Behavior During Therapy WFL for tasks assessed/performed      Past Medical History:  Diagnosis Date  . Agenesis of corpus callosum (HCC)   . Cerebral palsy (HCC)   . Groin abscess 03/02/2008  . Pneumonia   . Seizures (HCC)   . Status epilepticus (HCC)   . Ventriculomegaly of brain, congenital [facility])     Past Surgical History:  Procedure Laterality Date  . STRABISMUS SURGERY     in [place]    There were no vitals filed for this visit.      Subjective Assessment - 04/27/17 1024    Currently in Pain? No/denies  Per parent reports, pt does not speak   Multiple Pain Sites No           [facility] OT Assessment - 04/27/17 0001      Assessment   Diagnosis Congenitial Quadriplegia, L wrist/hand spasticity  W/ L hand/wrist Spasticity > R   Referring Provider Elveria Rising, NP   Onset Date --  Birth   Prior Therapy Has had splinting "when he was younger"     Precautions   Precautions Other (comment)  Seizure     Home  Environment   Family/patient expects to be discharged to: Private residence   Lives With Family  Parents     Prior Function   Level of Independence Other (comment)  Dependent for all needs/ADL's   Vocation On disability   Vocation Requirements Goes to After [employer] here in town several days  per week     ADL   ADL comments Total/Dependent for all ADL's     IADL   Shopping Completely unable to shop  Dependent/Unable   Light Housekeeping Does not participate in any housekeeping tasks  Dependent/Unable   Meal Prep Needs to have meals prepared and served  Dependent/Unable/Parents prepare/feed him   Medication Management Is not capable of dispensing or managing own medication  Dependent/Unable   Financial Management Dependent  Unable     Mobility   Mobility Status --  Dependent/Unable 2* Congenital Quadriplegia     Cognition   Overall Cognitive Status --  H/o Congenital Quadriplegia w/ significan intellectual delay     Observation/Other Assessments   Observations h/o congenital quedriplegia. LUE with increased spasticity vs R. Spasticity increases with PROM during assessment noted. As pt relaxes, tone decreases.   Other Surveys  Select   Outcome Measures Clinical judgement; ADL assessment w/ parents; positioning LUE vs RUE     Coordination   Gross Motor Movements are Fluid and Coordinated No   Fine Motor Movements are Fluid and Coordinated No   Other Congenital quadriplegia     Edema   Edema None noted bilateral UE's     Tone   Assessment Location Right Upper Extremity;Left Upper Extremity  RUE Tone   RUE Tone Moderate     LUE Tone   LUE Tone Moderate  Increased at hand and wrist vs RUE                  OT Treatments/Exercises (OP) - 04/27/17 0001      Splinting   Splinting Pt LUE was assessed for possible custom anti-spasticity splinting. Pt is noted to keep his LUE wrist and fingers significantly flexed position at rest due to congenital quadriplegia, however, when gentle PROM was applied, therapist was able to extend fingers and wrist w/o much difficulty. Pt would be good candidate for custom splinting to left wrist/hand for intermittent day and eventual daytime use. However, as therapist was discussing with pt parents, pt was noted to move  LUE and "bang' hand on therapist and stroller, therefore, custom splinting would not be approproate as pt may injure himself or another. Pt parents were in agreement with this and opted to discuss prefabricated splinting recommendations for better positioning. that will not be made from hard/plastic materail as well as material that could be adjusted w/o heating/tools for comfort. Multiple catalogues and online splinting options were reviewed and pt was measured. In conclusion, OT will order A left medium SoftPro Grip splint. Pt will then return to clinic for fitting when splint arrives and parents will be instructed in splint don/doff, wear and care as well as precautions. They verbalized understanding in clinic today. Pt father then asked about pt R hand and reports that pt flexes his fingers into his palm, causing his fingers to "dig into" voalr palm at noc. Pt was fitted with a right palm protector and a piece of red cylindrical foam was placed  to increased finger extension. Pt parents ewre inctructed to gradually increase wear of palm protector during the day, monitoring for possible irritation or other skin conditions/pressure areas or intolerance, before attemptng to let pt sleep in this since he is non-verbal. They verbalized understandiong of all of this and pt wore this palm protector in his R hand w/o incident for remainder of visit.      OT will order Left Medium SoftPro Grip Splint (from [facility] see pp 736 of clinic catalogue for details) and pt/parents will return for fitting and splint instructions.          OT Education - 04/27/17 1055    Education provided Yes   Education Details OT assessment findings, results. Recommendations for splinting L UE and will order. RUE wear and care of palm protector initially diuring day and eventually use at noc as tolerated to keep R fingers from "digging into palm".   Person(s) Educated Patient;Parent(s)   Methods  Explanation;Demonstration;Verbal cues   Comprehension Verbalized understanding;Returned demonstration;Need further instruction  Pt Parents          OT Short Term Goals - 04/27/17 1118      OT SHORT TERM GOAL #1   Title Pt caregivers will be Independent with RUE palm protector use    Baseline verbal instruction   Time 4   Period Weeks   Status New     OT SHORT TERM GOAL #2   Title Pt caregivers will return to clinic for fitting of LUE SoftPro Grip splint (when it arrives) for better positioning of L wrist/hand.   Baseline dependent/Splint ordered   Time 4   Period Weeks   Status New           OT Long Term Goals - 04/27/17 1115  OT LONG TERM GOAL #1   Title Pt caregivers will be Independent with LUE splinting for better positoining due to tone 2* congenital quadriplegia.   Time 8   Period Weeks   Status New     OT LONG TERM GOAL #2   Title Pt caregivers will Independently state precautions and possible contraindications of LUE splint use.   Time 8   Period Weeks   Status New               Plan - 05/06/17 1101    Clinical Impression Statement Pt and his parents/caregivers presented to OT clinic today for consideration of splinting of LUE secondary to Congenital Quadriplegia with spasticity (PMH also includes intellectual disability, seizures, dysphagia, constipation, please see EPIC for complete PMH). Pt should benefit from use of SoftPro Grip Splint. Pt's parents discussed possible options w/ OT for splinting and this was deemed to be the best plan of care this patient and the families needs as caregivers. Pt was also issued a palm protector for his right hand and pt parents will gradually increase use of this at home to keep finger on right hand from digging into his palm. They verbalized understanding of OT recommendations and are in agreement with plan of care. They will return for follow up and fitting of splint when it arrives in clinic.   Occupational  performance deficits (Please refer to evaluation for details): Other  Positioning of LUE    Rehab Potential Good   OT Frequency Other (comment)  Eval + up to 3 additional visits for splinting fitting and adjustments over next 8 weeks   OT Duration Other (comment)  See above   OT Treatment/Interventions Splinting;Patient/family education   Plan Pt & parent(s)/caregivers will return for f/u for fitting of SoftPro Grip Splint LUE. Family will be educated in splinting use,care and precautions and other recommendations at this time.   Clinical Decision Making Several treatment options, min-mod task modification necessary   Consulted and Agree with Plan of Care Family member/caregiver   Family Member Consulted Parents of patient      Patient will benefit from skilled therapeutic intervention in order to improve the following deficits and impairments:  Impaired tone  Visit Diagnosis: 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HCC) - Plan: Ot plan of care cert/re-cert  Spastic hemiplegia affecting left nondominant side, unspecified etiology (HCC) - Plan: Ot plan of care cert/re-cert      G-Codes - May 06, 2017 1127    Functional Assessment Tool Used (Outpatient only) Clinical Judgement; ADL's   Functional Limitation Other OT primary  Postioning Left UE   Other OT Primary Current Status (U0454) At least 80 percent but less than 100 percent impaired, limited or restricted   Other OT Primary Goal Status (U9811) At least 80 percent but less than 100 percent impaired, limited or restricted      Problem List Patient Active Problem List   Diagnosis Date Noted  . Health maintenance examination 08/14/2016  . Seizure (HCC) 04/23/2016  . Breakthrough seizure (HCC) 04/23/2016  . Left hip pain 02/05/2016  . Swelling of joint of pelvic region or thigh 02/05/2016  . Tachycardia 02/05/2016  . 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 CN (constipation) 07/25/2015  . Itchy eyes 07/25/2015  . Partial epilepsy with  impairment of consciousness, intractable (HCC) 03/14/2013  . 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HCC) 03/14/2013  . Scoliosis associated with other condition 03/14/2013  . Amblyopia 11/03/2012  . Folliculitis barbae 07/10/2011  .  URINARY INCONTINENCE, MIXED 08/01/2009  . Intellectual delay 01/14/2007  . Infantile cerebral palsy (HCC) 01/14/2007  . ECZEMA, ATOPIC DERMATITIS 01/14/2007    Mariam Dollar Dionicio Stall, OTR/L 04/27/2017, 11:33 AM  [facility] [address] [place], [place], [address] Phone: [phone]   Fax:  [phone]  Name: Carlos [name] MRN: [id number] Date of Birth: April 28, 1986

## 2017-04-27 NOTE — Patient Instructions (Signed)
OT will order Left Medium SoftPro Grip Splint (from [name] see pp 736 of clinic catalogue for details) and pt/parents will return for fitting and splint instructions.

## 2017-05-03 ENCOUNTER — Other Ambulatory Visit (INDEPENDENT_AMBULATORY_CARE_PROVIDER_SITE_OTHER): Payer: Self-pay | Admitting: Family

## 2017-05-03 DIAGNOSIS — K5900 Constipation, unspecified: Secondary | ICD-10-CM

## 2017-05-13 ENCOUNTER — Ambulatory Visit: Admitting: Occupational Therapy

## 2017-05-26 ENCOUNTER — Ambulatory Visit: Attending: Adult Health | Admitting: Physical Therapy

## 2017-05-26 DIAGNOSIS — G8114 Spastic hemiplegia affecting left nondominant side: Secondary | ICD-10-CM | POA: Insufficient documentation

## 2017-05-26 DIAGNOSIS — G800 Spastic quadriplegic cerebral palsy: Secondary | ICD-10-CM

## 2017-05-27 NOTE — Therapy (Signed)
[facility] [place] [facility] [address] [place], [place], [address] Phone: [phone]   Fax:  [phone]  Physical Therapy Evaluation  Patient Details  Name: Carlos [name] MRN: [id number] Date of Birth: 1986-04-22 Referring Provider: Elveria Rising, NP  Encounter Date: 2017-06-04      PT End of Session - 05/27/17 1919    Visit Number 1   Authorization Type Tricare/Medicaid   PT Start Time 0848   PT Stop Time 0959   PT Time Calculation (min) 71 min      Past Medical History:  Diagnosis Date  . Agenesis of corpus callosum (HCC)   . Cerebral palsy (HCC)   . Groin abscess 03/02/2008  . Pneumonia   . Seizures (HCC)   . Status epilepticus (HCC)   . Ventriculomegaly of brain, congenital [facility])     Past Surgical History:  Procedure Laterality Date  . STRABISMUS SURGERY     in [place]    There were no vitals filed for this visit.       Subjective Assessment - 05/27/17 1915    Subjective Pt presents to PT in a stroller - accompanied by his parents   Patient is accompained by: Family member   Pertinent History CP   Currently in Pain? No/denies            [facility] PT Assessment - 05/27/17 0001      Assessment   Medical Diagnosis Cerebral Palsy:  spastic quadriplegia   Referring Provider Elveria Rising, NP   Onset Date/Surgical Date --  congenital     Precautions   Precautions Other (comment)  seizure     Prior Function   Level of Independence Other (comment)  Dependent for all needs/ADL's   Vocation On disability   Vocation Requirements Goes to [employer] here in town several days per week            Objective measurements completed on examination: See above findings.          Pt seen for stroller evaluation with Andrew Au, ATP, from [employer] Complete LMN for stroller to be completed after quote is received from vendor  Parents are also requesting a new 6'x6' pad or mat  for floor activities - LMN will be completed for this item also                     Plan - 05/27/17 1921    Clinical Impression Statement Pt evaluated for stroller for easier transport for caregiver; vendor Doug Cobb, ATP from [employer] present for eval; LMN to be written after quote received from vendor   History and Personal Factors relevant to plan of care: CP - pt is nonverbal and has spastic quadriplegia   Clinical Presentation Stable   PT Frequency One time visit   PT Treatment/Interventions ADLs/Self Care Home Management;Other (comment)  wheelchair management   Recommended Other Services stroller to be obtained from [employer]; they also are requesting a 6'x6' pad for floor activities   Consulted and Agree with Plan of Care Patient;Family member/caregiver   Family Member Consulted parents      Patient will benefit from skilled therapeutic intervention in order to improve the following deficits and impairments:  Increased muscle spasms, Impaired flexibility, Decreased strength, Impaired tone, Impaired UE functional use, Hypomobility  Visit Diagnosis: Spastic quadriplegic cerebral palsy [facility]) - Plan: PT plan of care cert/re-cert      G-Codes - 06-04-17 1927    Functional  Assessment Tool Used (Outpatient Only) dependent for mobility and ADL's   Functional Limitation Mobility: Walking and moving around   Mobility: Walking and Moving Around Current Status [phone]([id number]) At least 80 percent but less than 100 percent impaired, limited or restricted   Mobility: Walking and Moving Around Goal Status [phone](G8979) At least 80 percent but less than 100 percent impaired, limited or restricted   Mobility: Walking and Moving Around Discharge Status [phone](G8980) At least 80 percent but less than 100 percent impaired, limited or restricted       Problem List Patient Active Problem List   Diagnosis Date Noted  . Health maintenance examination 08/14/2016  . Seizure (HCC)  04/23/2016  . Breakthrough seizure (HCC) 04/23/2016  . Left hip pain 02/05/2016  . Swelling of joint of pelvic region or thigh 02/05/2016  . Tachycardia 02/05/2016  . 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 CN (constipation) 07/25/2015  . Itchy eyes 07/25/2015  . Partial epilepsy with impairment of consciousness, intractable (HCC) 03/14/2013  . 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HCC) 03/14/2013  . Scoliosis associated with other condition 03/14/2013  . Amblyopia 11/03/2012  . Folliculitis barbae 07/10/2011  . URINARY INCONTINENCE, MIXED 08/01/2009  . Intellectual delay 01/14/2007  . Infantile cerebral palsy (HCC) 01/14/2007  . ECZEMA, ATOPIC DERMATITIS 01/14/2007    Kary Kosilday, Antonetta Clanton Suzanne, PT 05/27/2017, 7:34 PM  [facility] [address] [place], [place], [address] Phone: [phone]   Fax:  [phone]  Name: Carlos [name] MRN: [id number] Date of Birth: 12/30/1985

## 2017-06-01 ENCOUNTER — Ambulatory Visit: Admitting: Occupational Therapy

## 2017-06-01 DIAGNOSIS — G8114 Spastic hemiplegia affecting left nondominant side: Secondary | ICD-10-CM

## 2017-06-01 DIAGNOSIS — G800 Spastic quadriplegic cerebral palsy: Secondary | ICD-10-CM | POA: Diagnosis not present

## 2017-06-01 NOTE — Therapy (Signed)
[facility] [address] [place], [place], [address] Phone: [phone]   Fax:  [phone]  Occupational Therapy Treatment  Patient Details  Name: Carlos [name] MRN: [id number] Date of Birth: 22-Oct-1986 Referring Provider: Rockwell Germany, NP  Encounter Date: 06/01/2017      OT End of Session - 06/01/17 1337    Visit Number 2   Number of Visits 4   Date for OT Re-Evaluation 06/22/17   Authorization Type Primary = Tricare - needs G-Code. Secondary = Medicaid   OT Start Time 1150   OT Stop Time 1230   OT Time Calculation (min) 40 min   Activity Tolerance Patient tolerated treatment well      Past Medical History:  Diagnosis Date  . Agenesis of corpus callosum ([place])   . Cerebral palsy ([place])   . Groin abscess 03/02/2008  . Pneumonia   . Seizures ([place])   . Status epilepticus ([place])   . Ventriculomegaly of brain, congenital [facility])     Past Surgical History:  Procedure Laterality Date  . STRABISMUS SURGERY     in [place]    There were no vitals filed for this visit.                    OT Treatments/Exercises (OP) - 06/01/17 0001      ADLs   ADL Comments Discussion re: goals and POC. Pt to return in 2 weeks to assess if splint is still going well and review /address remaining goal     Splinting   Splinting Pt/family issued SoftPro Grip splint and instructed in proper donning/doffing, hygiene care, and washing instructions. Pt's mother given handout on wear and care and copy of splint ordered and website for future orders prn. Pt's mother also reports Rt palm protector working well.                 OT Education - 06/01/17 1336    Education provided Yes   Education Details splint wear and care, proper donning/doffing, hygiene care of hand, and laundry care of splint   Person(s) Educated Patient;Parent(s)   Methods Explanation;Demonstration   Comprehension Verbalized  understanding;Returned demonstration          OT Short Term Goals - 06/01/17 1337      OT SHORT TERM GOAL #1   Title Pt caregivers will be Independent with RUE palm protector use    Baseline verbal instruction   Time 4   Period Weeks   Status Achieved     OT SHORT TERM GOAL #2   Title Pt caregivers will return to clinic for fitting of LUE SoftPro Grip splint (when it arrives) for better positioning of L wrist/hand.   Baseline dependent/Splint ordered   Time 4   Period Weeks   Status Achieved           OT Long Term Goals - 06/01/17 1338      OT LONG TERM GOAL #1   Title Pt caregivers will be Independent with LUE splinting for better positoining due to tone 2* congenital quadriplegia.   Time 8   Period Weeks   Status On-going     OT LONG TERM GOAL #2   Title Pt caregivers will Independently state precautions and possible contraindications of LUE splint use.   Time 8   Period Weeks   Status Achieved               Plan - 06/01/17 1338  Clinical Impression Statement Pt met STG's and approximating LTG's. Pt fitted for splint today and appears to be working well.    Rehab Potential Good   OT Treatment/Interventions Self-care/ADL training;Splinting   Plan Assess how pre-fab splint is going and if no further problems d/c next session   Consulted and Agree with Plan of Care Family member/caregiver   Family Member Consulted Parents of patient      Patient will benefit from skilled therapeutic intervention in order to improve the following deficits and impairments:  Impaired tone  Visit Diagnosis: Spastic quadriplegic cerebral palsy (HCC)  Spastic hemiplegia affecting left nondominant side, unspecified etiology ([place])    Problem List Patient Active Problem List   Diagnosis Date Noted  . Health maintenance examination 08/14/2016  . Seizure ([place]) 04/23/2016  . Breakthrough seizure ([place]) 04/23/2016  . Left hip pain 02/05/2016  . Swelling of joint of  pelvic region or thigh 02/05/2016  . Tachycardia 02/05/2016  . 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 CN (constipation) 07/25/2015  . Itchy eyes 07/25/2015  . Partial epilepsy with impairment of consciousness, intractable ([place]) 03/14/2013  . Generalized convulsive epilepsy with intractable epilepsy ([place]) 03/14/2013  . 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[place]) 03/14/2013  . Scoliosis associated with other condition 03/14/2013  . Amblyopia 11/03/2012  . Folliculitis barbae 47/39/5844  . URINARY INCONTINENCE, MIXED 08/01/2009  . Intellectual delay 01/14/2007  . Infantile cerebral palsy ([place]) 01/14/2007  . ECZEMA, ATOPIC DERMATITIS 01/14/2007    [name], OTR/L 06/01/2017, 1:40 PM  [place] [address] [place], [place], [address] Phone: [phone]   Fax:  [phone]  Name: Carlos [name] MRN: [id number] Date of Birth: [birth year]

## 2017-06-16 ENCOUNTER — Encounter: Payer: Self-pay | Admitting: Occupational Therapy

## 2017-06-16 ENCOUNTER — Ambulatory Visit: Admitting: Occupational Therapy

## 2017-06-16 NOTE — Therapy (Signed)
[facility] [address] [place], [place], [address] Phone: [phone]   Fax:  [phone]  Patient Details  Name: Carlos [name] MRN: [id number] Date of Birth: 1986-09-13 Referring Provider:  Rockwell Germany, NP Encounter Date: 06/16/2017  OCCUPATIONAL THERAPY DISCHARGE SUMMARY  Visits from Start of Care: 2  Current functional level related to goals / functional outcomes:     OT Short Term Goals - 06/01/17 1337      OT SHORT TERM GOAL #1   Title Pt caregivers will be Independent with RUE palm protector use    Baseline verbal instruction   Time 4   Period Weeks   Status Achieved     OT SHORT TERM GOAL #2   Title Pt caregivers will return to clinic for fitting of LUE SoftPro Grip splint (when it arrives) for better positioning of L wrist/hand.   Baseline dependent/Splint ordered   Time 4   Period Weeks   Status Achieved         OT Long Term Goals - 06/01/17 1338      OT LONG TERM GOAL #1   Title Pt caregivers will be Independent with LUE splinting for better positoining due to tone 2* congenital quadriplegia.   Time 8   Period Weeks   Status Achieved     OT LONG TERM GOAL #2   Title Pt caregivers will Independently state precautions and possible contraindications of LUE splint use.   Time 8   Period Weeks   Status Achieved        Remaining deficits: CP with spastic quadraplegia   Education / Equipment: Splint wear and care  Plan: Patient agrees to discharge.  Patient goals were met. Patient is being discharged due to a change in medical status.  Pt was unable to attend last scheduled appointment d/t pt having seizures (per telephone conversation with pt's mother, Clarene Critchley). Clarene Critchley reports splint for LUE is not causing any issues (skin breakdown, pressure areas), however they are still working on pt's tolerance to having a splint on, as pt does not like it on and trying to get it off. Discussed d/c  today secondary to POC was to d/c today if no further problems, and due to pt's change in medical status. Pt's mother, Clarene Critchley, agreeable to d/c. She was informed that he could return to therapy with new MD orders if any changes occur. ?????              [name], OTR/L 06/16/2017, 9:50 AM  [facility] [address] [place], [place], [address] Phone: [phone]   Fax:  [phone]

## 2017-07-05 IMAGING — CR DG ABDOMEN ACUTE W/ 1V CHEST
3 series · 3 of 3 positions shown · non-contrast
Comparison: 12/27/2004

CLINICAL DATA: Multiple seizures.  Constipation.

EXAM:
DG ABDOMEN ACUTE W/ 1V CHEST

[abdomen erect]
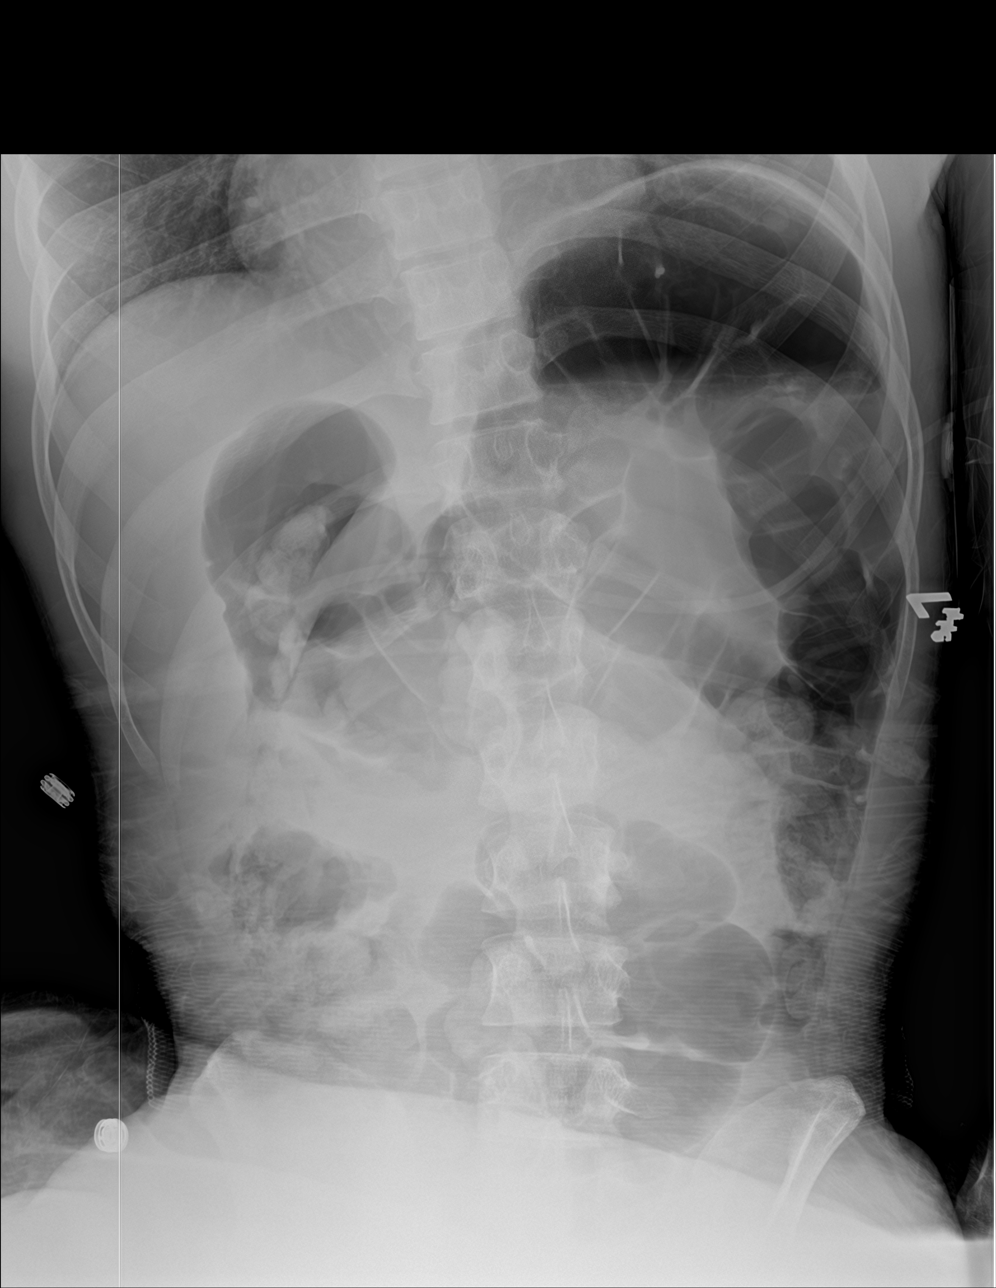

[abdomen supine]
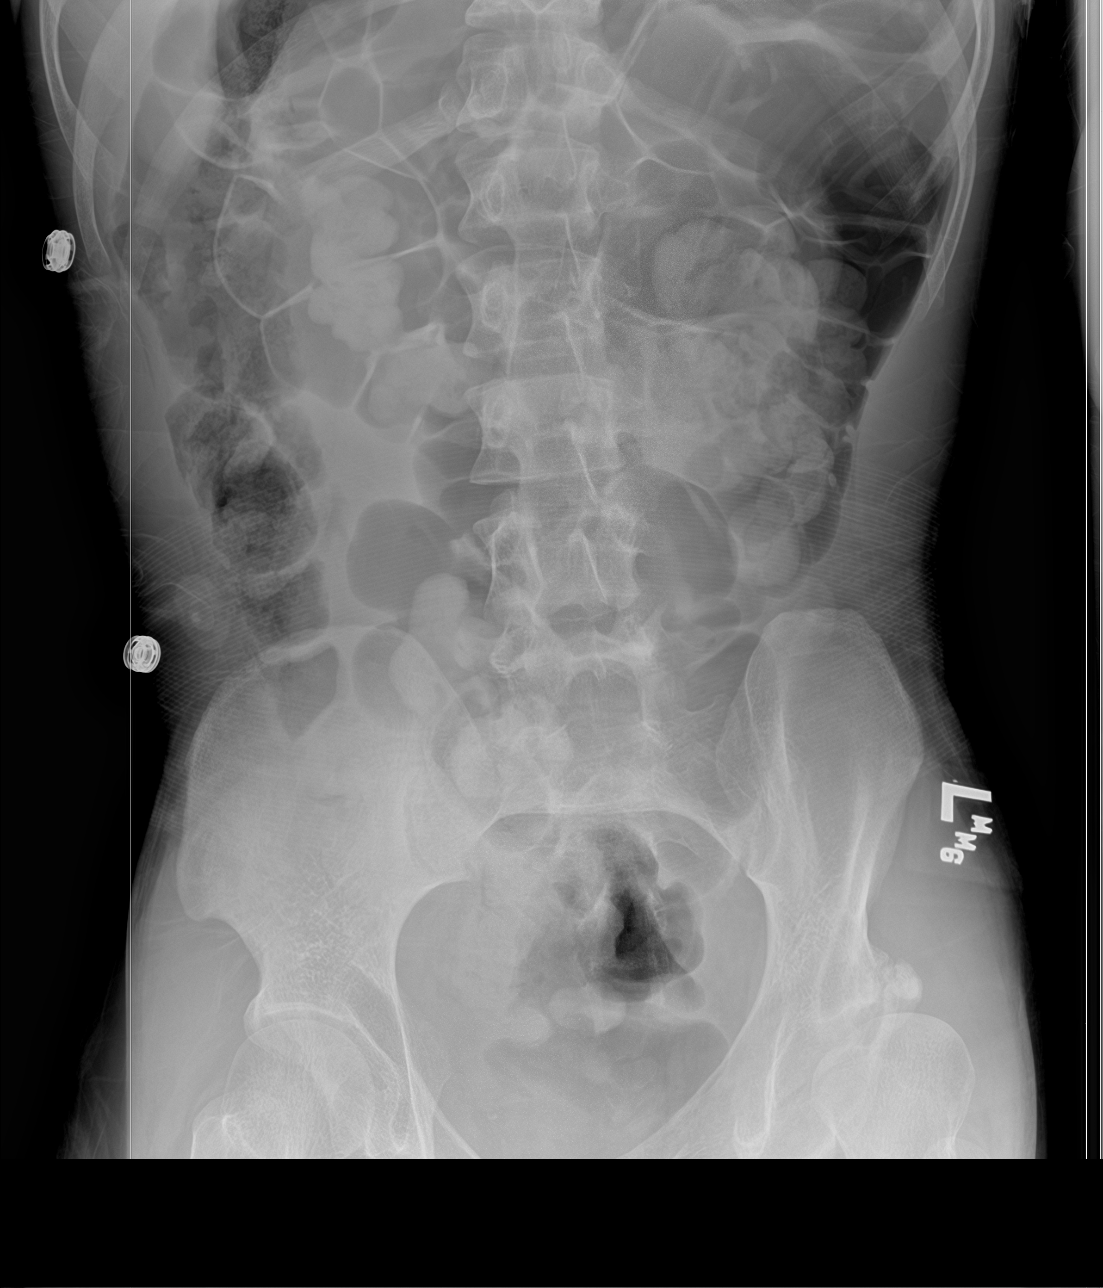

[chest ap]
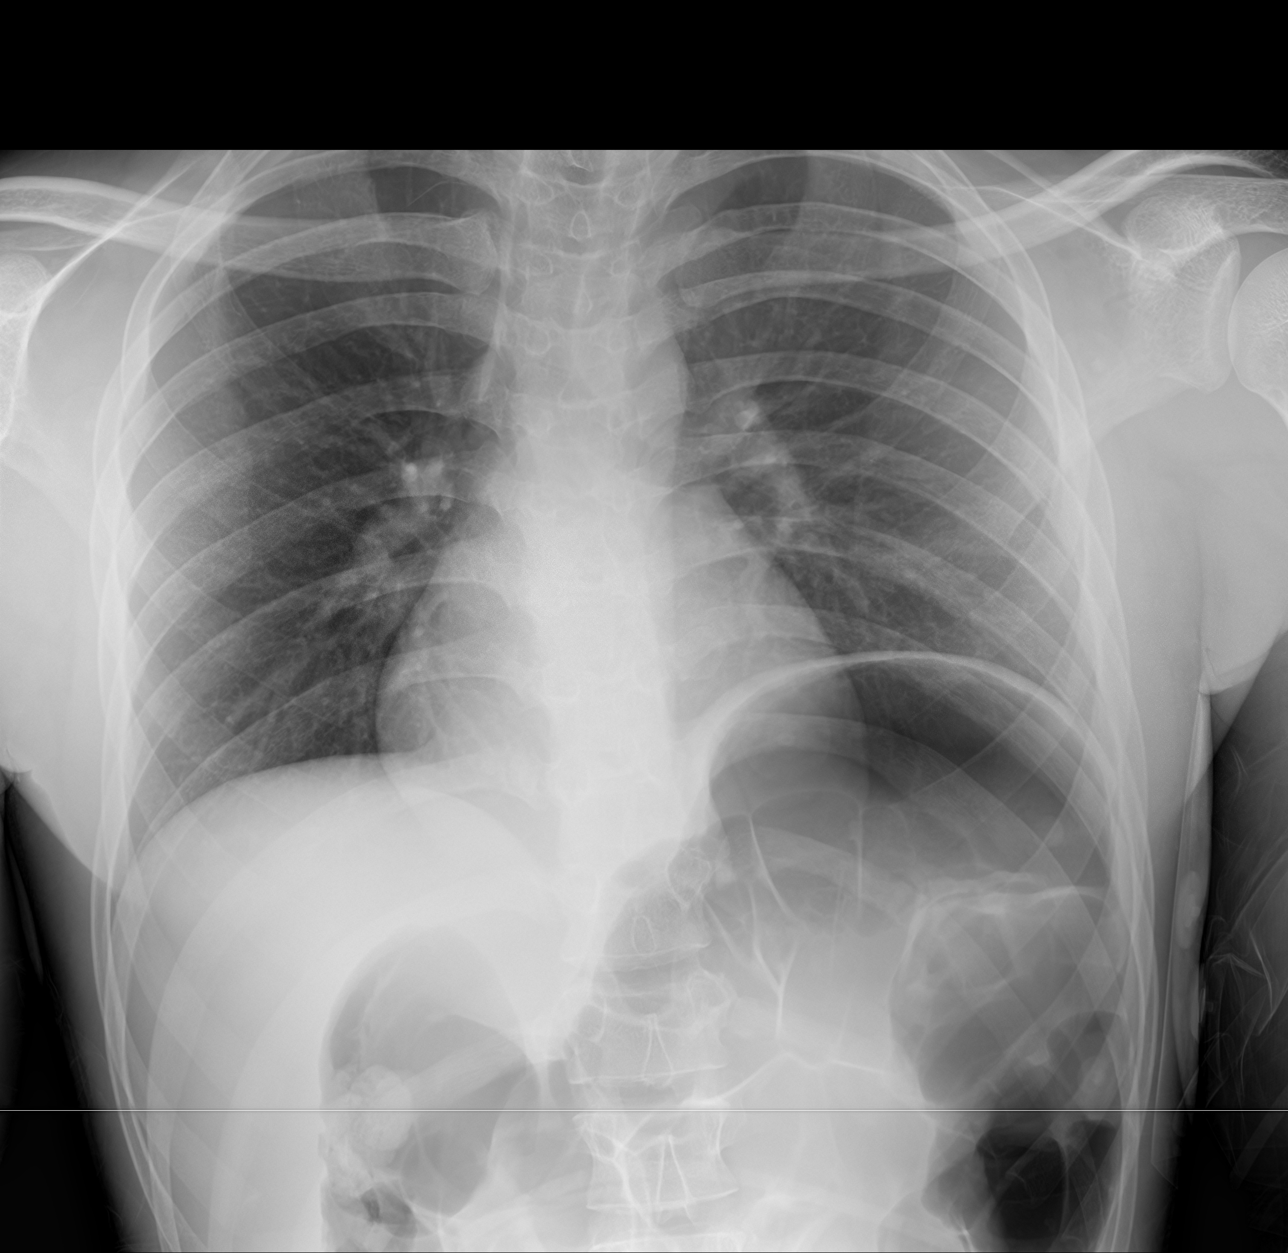

[3 of 3 positions shown; findings below may reference images not displayed]

FINDINGS: Normal heart size and mediastinal contours. Low volume chest without
infiltrate or edema. No effusion or pneumothorax.

Prominent colonic gas without obstructive pattern. This appearance
was also seen in 7999 and could be from air swallowing or
dysmotility. No evidence of pneumoperitoneum or pneumatosis.

Dysmorphic left hip with coxa valga and acetabular fragmentation.
IMPRESSION: 1. No evidence of acute cardiopulmonary disease.
2. Gaseous distention of colon which was also seen in 7999 and
favors air swallowing or dysmotility. No suspected obstruction or
perforation.

## 2017-07-05 IMAGING — CT CT HEAD W/O CM
3 of 4 series · 16 of 47 positions shown, 19 images · non-contrast
Comparison: None.

CLINICAL DATA: Initial evaluation for probable worsening seizures.
History of cerebral palsy.

EXAM:
CT HEAD WITHOUT CONTRAST
TECHNIQUE: Contiguous axial images were obtained from the base of the skull
through the vertex without intravenous contrast.

[Series 201: head w/o, idose (1) · axial · non-contrast · 0.49mm/px · z∈[+1023,+1158]mm · 10 of 33 slices shown, 13 images]
[im 3/33  brain]
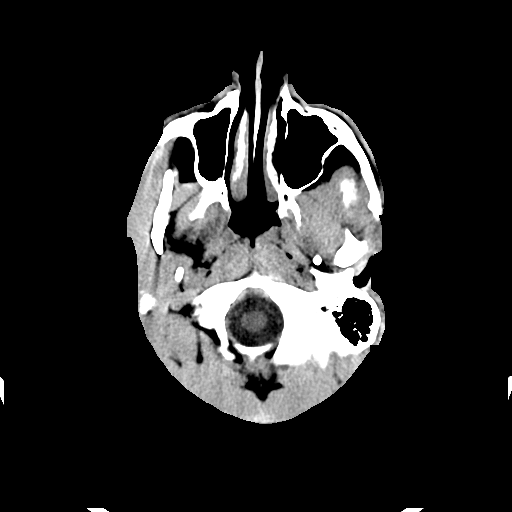
[im 3/33  bone]
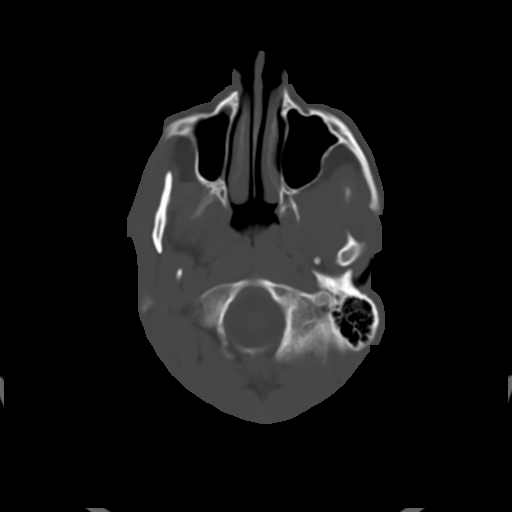
[im 5/33  brain]
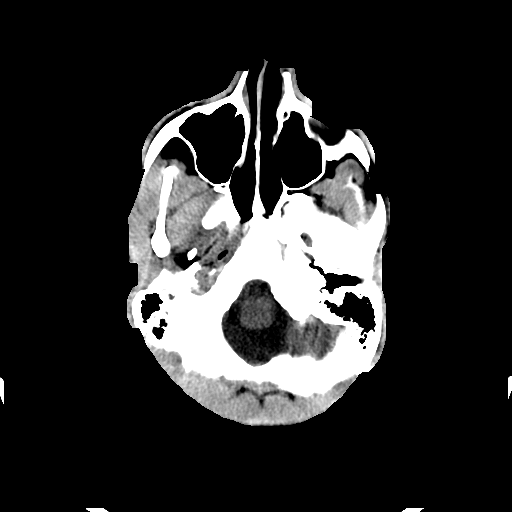
[im 10/33  brain]
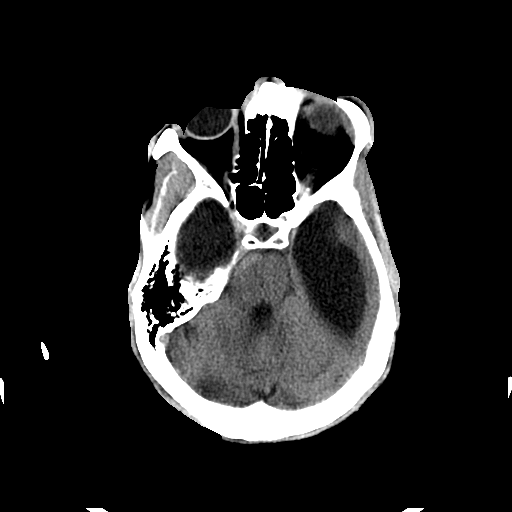
[im 12/33  brain]
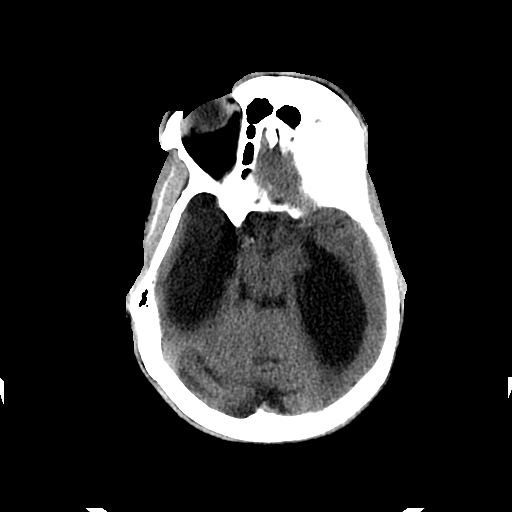
[im 14/33  brain]
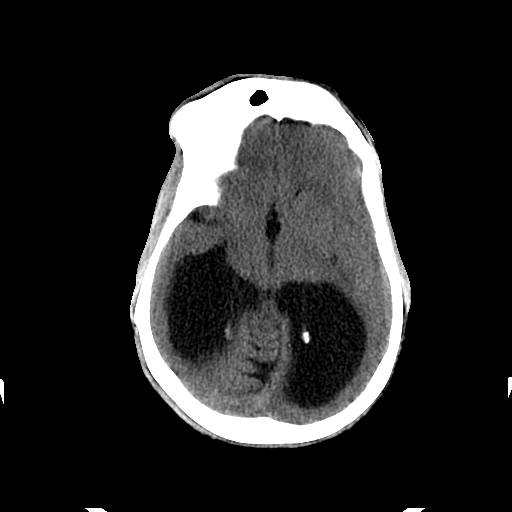
[im 14/33  bone]
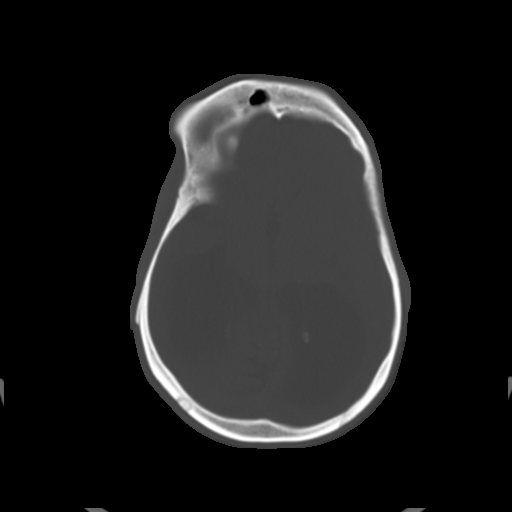
[im 19/33  brain]
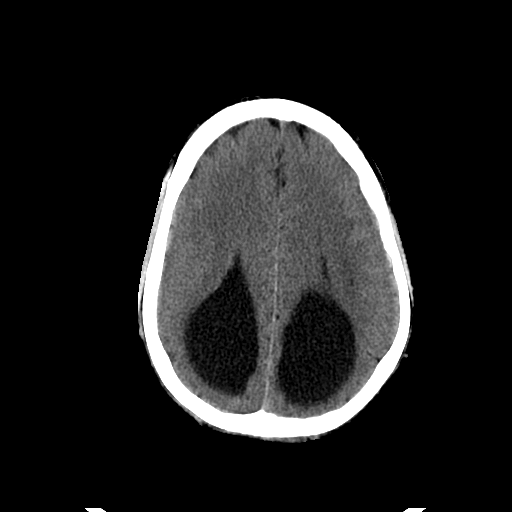
[im 21/33  brain]
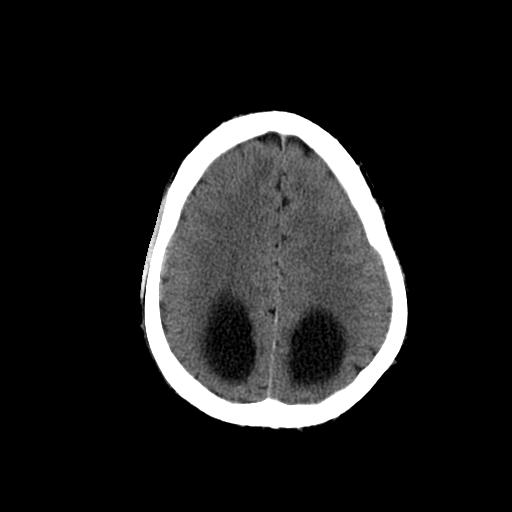
[im 23/33  brain]
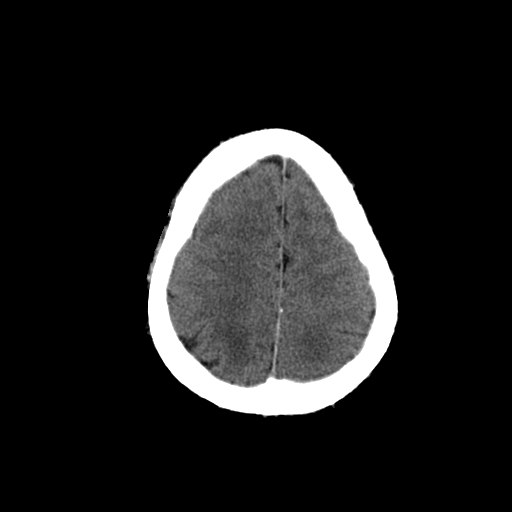
[im 28/33  brain]
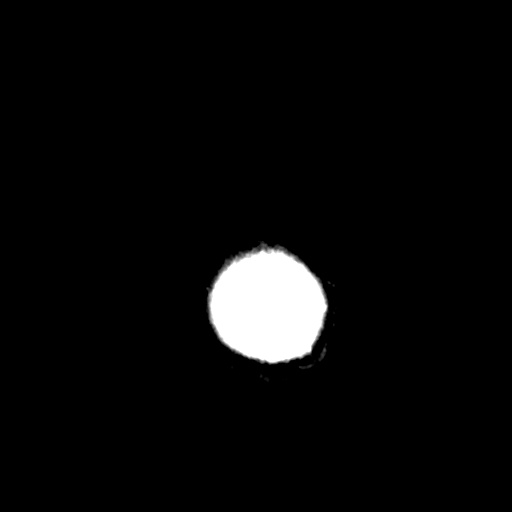
[im 28/33  bone]
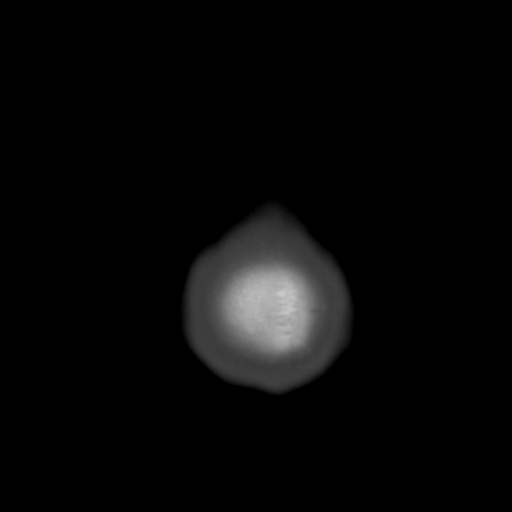
[im 30/33  brain]
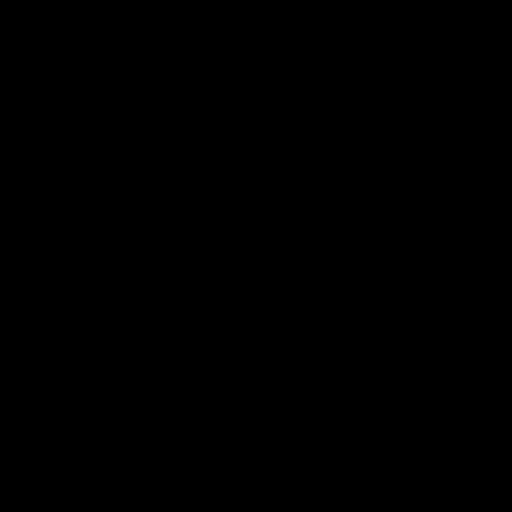

[Series 203: coronal st, idose (1) · coronal · 0.40mm/px · 3 of 79 slices shown]
[im 27/79  brain]
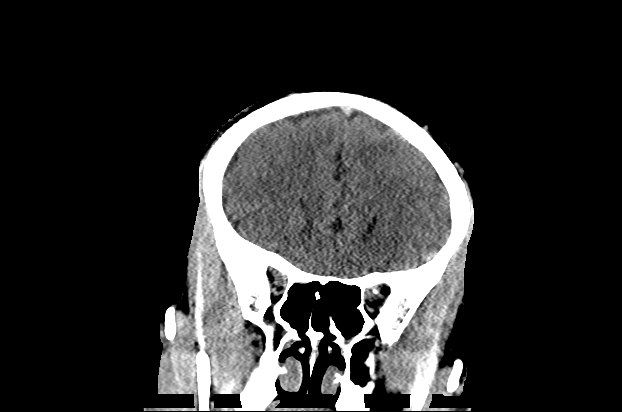
[im 35/79  brain]
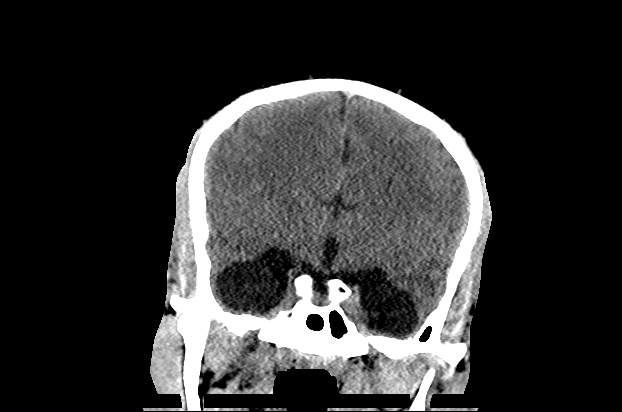
[im 44/79  brain]
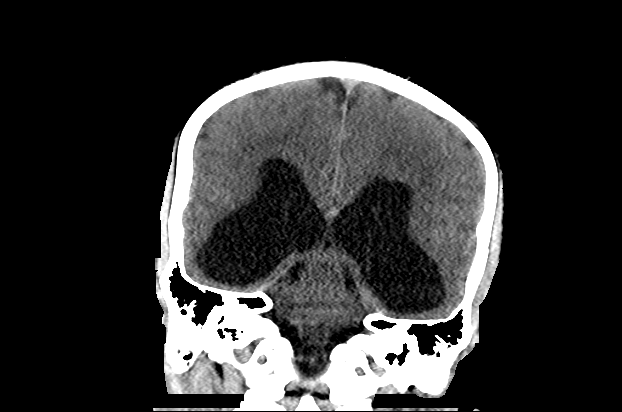

[Series 204: sagittal st, idose (1) · sagittal · 0.40mm/px · 3 of 83 slices shown]
[im 28/83  brain]
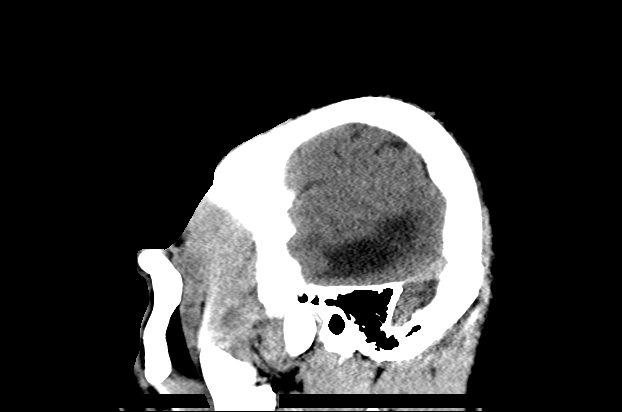
[im 42/83  brain]
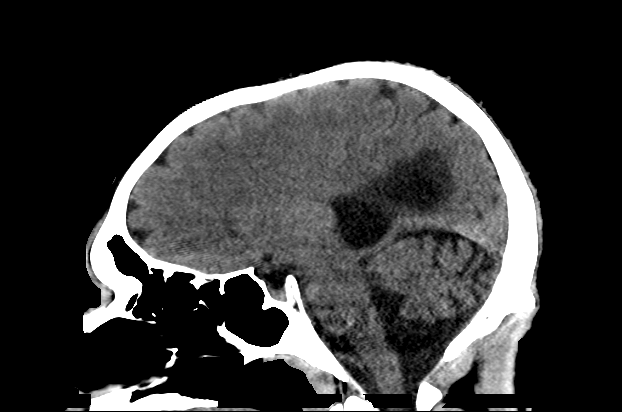
[im 55/83  brain]
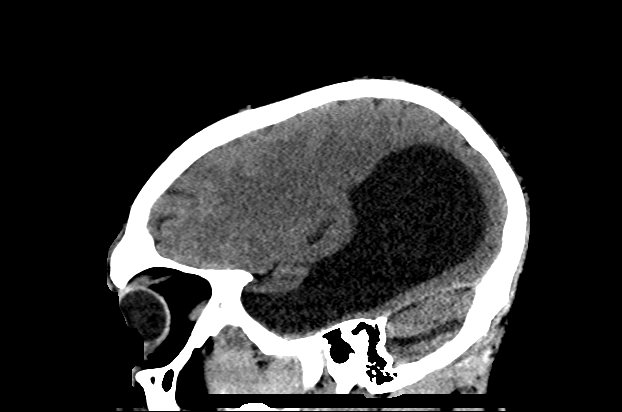

[16 of 47 positions shown; findings below may reference images not displayed]

FINDINGS: Agenesis of the corpus callosum with associated colpocephaly
present. The posterior aspect of the lateral ventricles are markedly
dilated extending from their occipital horns through the temporal
horns. This is suspected to be chronic. Associated cerebral white
matter loss within these regions. Third and fourth ventricles are
decompressed. Cerebellum somewhat hypoplastic. No midline lipoma or
other lesion identified.

No acute large vessel territory infarct. Possible small remote
lacunar infarct within the left basal ganglia versus dilated
perivascular space. No acute intracranial hemorrhage. No mass lesion
or midline shift. No extra-axial fluid collection.

Scalp soft tissues demonstrate no acute abnormality. Few small
lipoma is noted. No acute abnormality about the globes and orbits.

Paranasal sinuses are clear. No mastoid effusion. Middle ear
cavities are clear.

Calvarium intact.
IMPRESSION: 1. No acute intracranial process identified.
2. Agenesis of the corpus callosum with associated colpocephaly and
cerebellar hypoplasia.
3. 7 mm hypodensity within the left basal ganglia, which may reflect
a small remote lacunar infarct versus dilated perivascular space.

## 2017-07-06 IMAGING — DX DG CHEST 1V PORT
1 series · 1 of 1 positions shown · non-contrast
Comparison: Portable chest x-ray from an abdominal series of April 23, 2016 and March 06, 2013

CLINICAL DATA: Fever, cerebral palsy, epilepsy, congenital
quadriplegia

EXAM:
PORTABLE CHEST 1 VIEW

[chest ap]
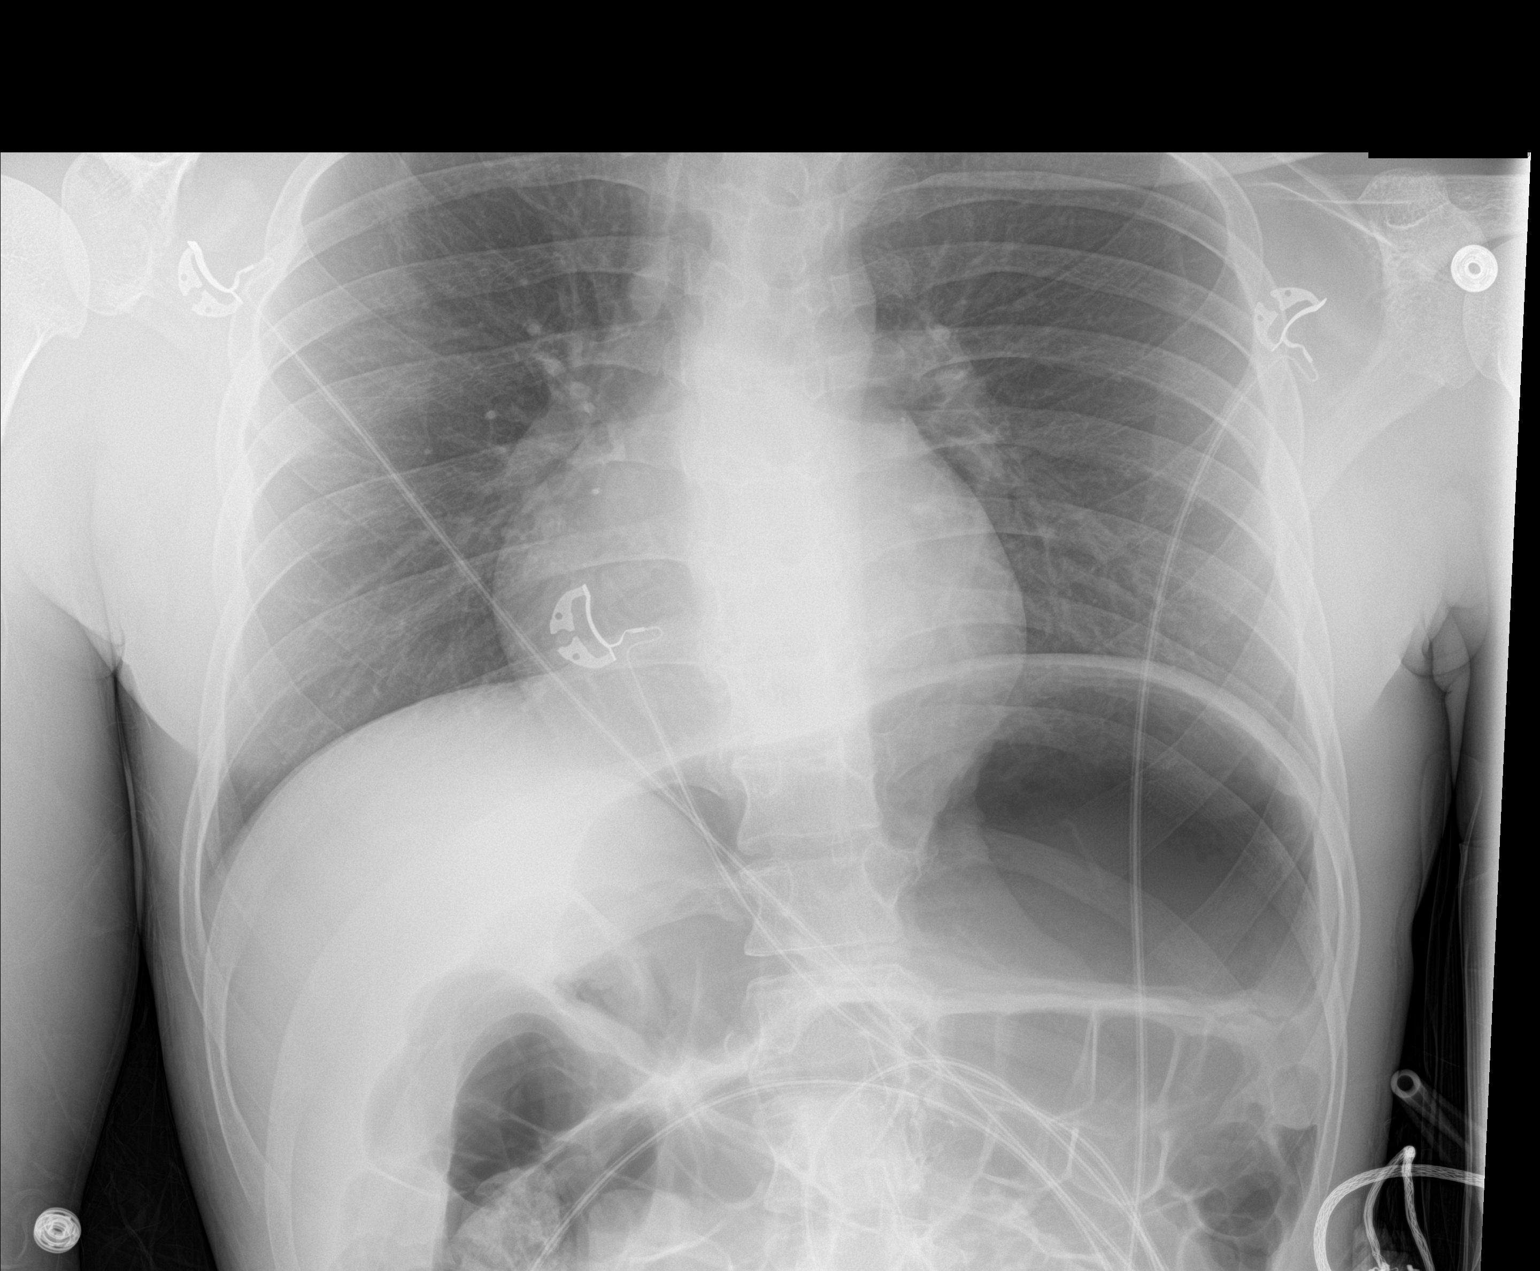

[1 of 1 positions shown; findings below may reference images not displayed]

FINDINGS: The lungs are adequately inflated and clear. The heart and
mediastinal structures are normal. There is no pleural effusion or
pneumothorax. There is mild dextrocurvature centered in the mid
thoracic spine.

There is moderate gaseous distention of the stomach as well as large
bowel loops.
IMPRESSION: There is no active cardiopulmonary disease. Persistent gaseous
distention of the stomach and visualized large bowel loops similar
to that seen in 3689 and in 6554.

## 2017-07-09 ENCOUNTER — Other Ambulatory Visit (INDEPENDENT_AMBULATORY_CARE_PROVIDER_SITE_OTHER): Payer: Self-pay | Admitting: Family

## 2017-07-09 DIAGNOSIS — G40319 Generalized idiopathic epilepsy and epileptic syndromes, intractable, without status epilepticus: Secondary | ICD-10-CM

## 2017-07-09 DIAGNOSIS — M24511 Contracture, right shoulder: Secondary | ICD-10-CM

## 2017-07-09 DIAGNOSIS — G40219 Localization-related (focal) (partial) symptomatic epilepsy and epileptic syndromes with complex partial seizures, intractable, without status epilepticus: Secondary | ICD-10-CM

## 2017-07-09 DIAGNOSIS — G808 Other cerebral palsy: Secondary | ICD-10-CM

## 2017-07-09 DIAGNOSIS — F819 Developmental disorder of scholastic skills, unspecified: Secondary | ICD-10-CM

## 2017-08-04 ENCOUNTER — Encounter: Payer: Self-pay | Admitting: Family Medicine

## 2017-08-04 ENCOUNTER — Ambulatory Visit (INDEPENDENT_AMBULATORY_CARE_PROVIDER_SITE_OTHER): Admitting: Family Medicine

## 2017-08-04 VITALS — BP 100/62 | HR 58 | Temp 97.7°F | Ht 61.0 in | Wt 107.0 lb

## 2017-08-04 DIAGNOSIS — Z0000 Encounter for general adult medical examination without abnormal findings: Secondary | ICD-10-CM

## 2017-08-04 NOTE — Patient Instructions (Addendum)
Carlos [name] is here today for a check up and I feel that he is doing well.  I have no concerns today.  I have filled out his paperwork today. I would like to see him again in one year. I'm happy to help with anything in the meantime.  Very nice seeing you today, Carlos [name] L. Myrtie Soman, MD [facility] Resident PGY-2 08/04/2017 2:01 PM

## 2017-08-04 NOTE — Progress Notes (Signed)
    Subjective:  Carlos [name] is a 31 y.o. male who presents to the [facility] today for yearly checkup  HPI:  Patient is here today for yearly checkup is accompanied by his father. There've been no significant changes in the past year since I last saw him. He was recently seen by his pediatric neurologist and there were no changes made to his medications.  He has been seizure free over the last year. Dad reports no significant concerns. Continues with blackheads in his ears. He uses noxema pads and etoh to help clean them.  He recently received a prescription for a new stroller, but this has not come in.  Dad has brought some paperwork with him for me to fill out today for daycare.    PMH: Infantile cerebral palsy, generalized convulsive epilepsy with intractable epilepsy Tobacco use: No tobacco use Medication: reviewed and updated ROS: see HPI   Objective:  Physical Exam: BP 100/62   Pulse (!) 58   Temp 97.7 F (36.5 C) (Oral)   Ht  (1.549 m)   Wt 107 lb (48.5 kg)   SpO2 95%   BMI 20.22 kg/m   Gen: 31 year old male NAD sitting in stroller and resting comfortably CV: RRR with no murmurs appreciated Pulm: NWOB, CTAB with no crackles, wheezes, or rhonchi GI: Normal bowel sounds present. Soft, Nontender, Nondistended. MSK: Spastic quadriparesis, moves arms and legs Skin: warm, dry Neuro: grossly normal, moves all extremities Psych: Nonverbal  No results found for this or any previous visit (from the past 72 hour(s)).   Assessment/Plan:  Health maintenance examination Patient presents today for health maintenance exam and is well-appearing. He is due today for HIV screening, but I feel that she is very low risk and this is probably not necessary. Also due for flu vaccination today, however his neurologist has advised against this in the past. Will defer to neurologists recommendations. No significant concerns today and appears that [name] is about at his baseline. We will have  [name] follow-up next year and am available for any medical concerns in the interim.

## 2017-08-18 ENCOUNTER — Ambulatory Visit (INDEPENDENT_AMBULATORY_CARE_PROVIDER_SITE_OTHER): Admitting: Internal Medicine

## 2017-08-18 VITALS — BP 112/68 | HR 57 | Temp 97.9°F | Ht 61.0 in | Wt 107.0 lb

## 2017-08-18 DIAGNOSIS — S50862A Insect bite (nonvenomous) of left forearm, initial encounter: Secondary | ICD-10-CM | POA: Diagnosis not present

## 2017-08-18 DIAGNOSIS — W57XXXA Bitten or stung by nonvenomous insect and other nonvenomous arthropods, initial encounter: Secondary | ICD-10-CM | POA: Diagnosis not present

## 2017-08-18 MED ORDER — CETIRIZINE HCL 5 MG/5ML PO SOLN: 10.0000 mg | Freq: Every day | ORAL | 0 refills | Status: AC | PRN

## 2017-08-18 MED ORDER — TRIAMCINOLONE ACETONIDE 0.5 % EX OINT: 1.0000 "application " | TOPICAL_OINTMENT | Freq: Two times a day (BID) | CUTANEOUS | 3 refills | Status: DC

## 2017-08-18 NOTE — Progress Notes (Signed)
   Subjective:    Carlos [name] - 31 y.o. male MRN [id number]  Date of birth: 1986/04/13  HPI  Carlos [name] is patient with history of CP here for rash on arm. Present on right inside of forearm. Parents noticed this rash yesterday evening when he returned from his adult daycare center. He has several small raised bumps. They applied hydrocortisone cream to the area. Over night, parents feel that the bumps have become even more raised and red. He does not seem to be bothered by them but it is difficult to tell. No known contacts with rash. Unsure if he was outside yesterday at his program. No new detergents, soaps, etc. No recent antibiotics or changes in medications. Has been afebrile, without nausea or vomiting. Has otherwise been acting normally. Eating, drinking, and voiding well.    -  reports that he is a non-smoker but has been exposed to tobacco smoke. He has never used smokeless tobacco. - Review of Systems: Per HPI. - Past Medical History: Patient Active Problem List   Diagnosis Date Noted  . Health maintenance examination 08/14/2016  . Seizure (HCC) 04/23/2016  . Breakthrough seizure (HCC) 04/23/2016  . Left hip pain 02/05/2016  . Swelling of joint of pelvic region or thigh 02/05/2016  . Tachycardia 02/05/2016  . 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 CN (constipation) 07/25/2015  . Itchy eyes 07/25/2015  . Partial epilepsy with impairment of consciousness, intractable (HCC) 03/14/2013  . 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HCC) 03/14/2013  . Scoliosis associated with other condition 03/14/2013  . Amblyopia 11/03/2012  . Folliculitis barbae 07/10/2011  . URINARY INCONTINENCE, MIXED 08/01/2009  . Intellectual delay 01/14/2007  . Infantile cerebral palsy (HCC) 01/14/2007  . ECZEMA, ATOPIC DERMATITIS 01/14/2007   - Medications: reviewed and updated   Objective:   Physical Exam BP 112/68   Pulse (!)  57   Temp 97.9 F (36.6 C) (Axillary)   Ht  (1.549 m)   Wt 107 lb (48.5 kg)   SpO2 97%   BMI 20.22 kg/m  Gen: NAD, alert, non-verbal  Skin: Flexor aspect of right forearm with several palpable, raised erythematous wheals. No erythema of surrounding skin. No excoriations present. No increased warmth.     Assessment & Plan:   1. Bug bite, initial encounter Appears to have a hypersensitivity reaction to some type of bug bite. Doubt bed bugs or scabies given localization and lack of involvement of distal portion of extremities. Potentially contact dermatitis but again less likely given localization and no known irritants. Will treat with anti-histamine and steroid cream. Advised to return if has systemic symptoms or if has spreading erythema/increased warmth of area as these could be signs of superimposed bacterial infection.  - triamcinolone ointment (KENALOG) 0.5 %; Apply 1 application topically 2 (two) times daily. Do not use for more than 1 week at a time.  Dispense: 60 g; Refill: 3 - cetirizine HCl (ZYRTEC) 5 MG/5ML SOLN; Take 10 mLs (10 mg total) by mouth daily as needed for itching.  Dispense: 300 mL; Refill: 0   Marcy Siren, D.O. 08/18/2017, 2:21 PM PGY-3, [facility]

## 2017-11-03 ENCOUNTER — Other Ambulatory Visit (INDEPENDENT_AMBULATORY_CARE_PROVIDER_SITE_OTHER): Payer: Self-pay | Admitting: Family

## 2017-11-03 DIAGNOSIS — G40219 Localization-related (focal) (partial) symptomatic epilepsy and epileptic syndromes with complex partial seizures, intractable, without status epilepticus: Secondary | ICD-10-CM

## 2017-11-03 DIAGNOSIS — G40209 Localization-related (focal) (partial) symptomatic epilepsy and epileptic syndromes with complex partial seizures, not intractable, without status epilepticus: Secondary | ICD-10-CM

## 2017-11-03 DIAGNOSIS — G40319 Generalized idiopathic epilepsy and epileptic syndromes, intractable, without status epilepticus: Secondary | ICD-10-CM

## 2017-11-23 ENCOUNTER — Telehealth (INDEPENDENT_AMBULATORY_CARE_PROVIDER_SITE_OTHER): Payer: Self-pay | Admitting: Family

## 2017-11-23 DIAGNOSIS — K5900 Constipation, unspecified: Secondary | ICD-10-CM

## 2017-11-23 DIAGNOSIS — G40319 Generalized idiopathic epilepsy and epileptic syndromes, intractable, without status epilepticus: Secondary | ICD-10-CM

## 2017-11-23 DIAGNOSIS — G40219 Localization-related (focal) (partial) symptomatic epilepsy and epileptic syndromes with complex partial seizures, intractable, without status epilepticus: Secondary | ICD-10-CM

## 2017-11-23 DIAGNOSIS — G40209 Localization-related (focal) (partial) symptomatic epilepsy and epileptic syndromes with complex partial seizures, not intractable, without status epilepticus: Secondary | ICD-10-CM

## 2017-11-23 MED ORDER — KEPPRA 750 MG PO TABS: ORAL_TABLET | ORAL | 0 refills | Status: DC

## 2017-11-23 MED ORDER — LAMICTAL 25 MG PO TABS: ORAL_TABLET | ORAL | 0 refills | Status: DC

## 2017-11-23 MED ORDER — LACOSAMIDE 100 MG PO TABS: ORAL_TABLET | ORAL | 0 refills | Status: DC

## 2017-11-23 MED ORDER — POLYETHYLENE GLYCOL 3350 17 GM/SCOOP PO POWD: ORAL | 6 refills | Status: DC

## 2017-11-23 MED ORDER — LAMICTAL 200 MG PO TABS
ORAL_TABLET | ORAL | 0 refills | Status: DC
Start: 2017-11-23 — End: 2017-12-02

## 2017-11-23 NOTE — Telephone Encounter (Signed)
Rx have been printed and placed on tina's desk

## 2017-11-23 NOTE — Telephone Encounter (Signed)
Informed mom that the rx were ready for pick up. She stated that she will pick them up tomorrow

## 2017-11-23 NOTE — Telephone Encounter (Signed)
°  Who's calling (name and relationship to patient) : Octavio GravesStover,Theresa (mother) Best contact Finazzo,Theresanumber: [phone] Provider they see: Elveria Risingina Goodpasture, NP Reason for call: Mother of Patient is calling in regards to written prescription refill. Mother requested to be contacted once the script is ready.     PRESCRIPTION REFILL ONLY  Name of prescription:  VIMPAT 100 MG   KEPPRA 750 MG   LAMICTAL 25 MG AND 200 MG   POLYETHYLENE GLYCOL POWDER

## 2017-12-02 ENCOUNTER — Ambulatory Visit (INDEPENDENT_AMBULATORY_CARE_PROVIDER_SITE_OTHER): Admitting: Family

## 2017-12-02 ENCOUNTER — Encounter (INDEPENDENT_AMBULATORY_CARE_PROVIDER_SITE_OTHER): Payer: Self-pay | Admitting: Family

## 2017-12-02 VITALS — BP 120/70 | HR 84

## 2017-12-02 DIAGNOSIS — G40209 Localization-related (focal) (partial) symptomatic epilepsy and epileptic syndromes with complex partial seizures, not intractable, without status epilepticus: Secondary | ICD-10-CM

## 2017-12-02 DIAGNOSIS — G40319 Generalized idiopathic epilepsy and epileptic syndromes, intractable, without status epilepticus: Secondary | ICD-10-CM

## 2017-12-02 DIAGNOSIS — G808 Other cerebral palsy: Secondary | ICD-10-CM | POA: Diagnosis not present

## 2017-12-02 DIAGNOSIS — G40219 Localization-related (focal) (partial) symptomatic epilepsy and epileptic syndromes with complex partial seizures, intractable, without status epilepticus: Secondary | ICD-10-CM

## 2017-12-02 DIAGNOSIS — F819 Developmental disorder of scholastic skills, unspecified: Secondary | ICD-10-CM

## 2017-12-02 DIAGNOSIS — L709 Acne, unspecified: Secondary | ICD-10-CM

## 2017-12-02 MED ORDER — VIMPAT 100 MG PO TABS: ORAL_TABLET | ORAL | 5 refills | Status: DC

## 2017-12-02 MED ORDER — METRONIDAZOLE 0.75 % EX LOTN: TOPICAL_LOTION | CUTANEOUS | 1 refills | Status: DC

## 2017-12-02 MED ORDER — LAMICTAL 25 MG PO TABS: ORAL_TABLET | ORAL | 5 refills | Status: DC

## 2017-12-02 MED ORDER — LAMICTAL 200 MG PO TABS: ORAL_TABLET | ORAL | 5 refills | Status: DC

## 2017-12-02 MED ORDER — KEPPRA 750 MG PO TABS: ORAL_TABLET | ORAL | 5 refills | Status: DC
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
## 2017-12-03 ENCOUNTER — Encounter (INDEPENDENT_AMBULATORY_CARE_PROVIDER_SITE_OTHER): Payer: Self-pay | Admitting: Family

## 2017-12-03 NOTE — Patient Instructions (Signed)
Thank you for bringing Carlos [name] in today.   Instructions for you until your next appointment are as follows: 1. Continue his seizure medicines as you have been giving them.  2. Let me know if he has any seizures or any other concerns that you may have 3. I have prescribed Metronidazole lotion for the acne on his buttocks. Apply a small amount to the acne lesions twice per day.  4. I will refer him to Dr Allena KatzPatel for ophthalmology evaluation.  5. Please sign up for MyChart if you have not done so 6. Please plan to return for follow up in 6 months or sooner if needed.

## 2018-05-12 ENCOUNTER — Telehealth (INDEPENDENT_AMBULATORY_CARE_PROVIDER_SITE_OTHER): Payer: Self-pay | Admitting: Family

## 2018-05-12 NOTE — Telephone Encounter (Signed)
error 

## 2018-05-30 ENCOUNTER — Other Ambulatory Visit (INDEPENDENT_AMBULATORY_CARE_PROVIDER_SITE_OTHER): Payer: Self-pay | Admitting: Family

## 2018-05-30 DIAGNOSIS — G40209 Localization-related (focal) (partial) symptomatic epilepsy and epileptic syndromes with complex partial seizures, not intractable, without status epilepticus: Secondary | ICD-10-CM

## 2018-05-30 DIAGNOSIS — G40319 Generalized idiopathic epilepsy and epileptic syndromes, intractable, without status epilepticus: Secondary | ICD-10-CM

## 2018-05-30 DIAGNOSIS — G40219 Localization-related (focal) (partial) symptomatic epilepsy and epileptic syndromes with complex partial seizures, intractable, without status epilepticus: Secondary | ICD-10-CM

## 2018-06-01 ENCOUNTER — Other Ambulatory Visit (INDEPENDENT_AMBULATORY_CARE_PROVIDER_SITE_OTHER): Payer: Self-pay | Admitting: Family

## 2018-06-01 DIAGNOSIS — G40219 Localization-related (focal) (partial) symptomatic epilepsy and epileptic syndromes with complex partial seizures, intractable, without status epilepticus: Secondary | ICD-10-CM

## 2018-06-01 DIAGNOSIS — G40209 Localization-related (focal) (partial) symptomatic epilepsy and epileptic syndromes with complex partial seizures, not intractable, without status epilepticus: Secondary | ICD-10-CM

## 2018-06-01 DIAGNOSIS — G40319 Generalized idiopathic epilepsy and epileptic syndromes, intractable, without status epilepticus: Secondary | ICD-10-CM

## 2018-06-30 ENCOUNTER — Encounter (INDEPENDENT_AMBULATORY_CARE_PROVIDER_SITE_OTHER): Payer: Self-pay | Admitting: Family

## 2018-06-30 ENCOUNTER — Telehealth (INDEPENDENT_AMBULATORY_CARE_PROVIDER_SITE_OTHER): Payer: Self-pay | Admitting: Family

## 2018-06-30 ENCOUNTER — Ambulatory Visit (INDEPENDENT_AMBULATORY_CARE_PROVIDER_SITE_OTHER): Admitting: Family

## 2018-06-30 VITALS — BP 110/80 | HR 68 | Wt 110.0 lb

## 2018-06-30 DIAGNOSIS — G40209 Localization-related (focal) (partial) symptomatic epilepsy and epileptic syndromes with complex partial seizures, not intractable, without status epilepticus: Secondary | ICD-10-CM | POA: Diagnosis not present

## 2018-06-30 DIAGNOSIS — R21 Rash and other nonspecific skin eruption: Secondary | ICD-10-CM

## 2018-06-30 DIAGNOSIS — G40219 Localization-related (focal) (partial) symptomatic epilepsy and epileptic syndromes with complex partial seizures, intractable, without status epilepticus: Secondary | ICD-10-CM

## 2018-06-30 DIAGNOSIS — G808 Other cerebral palsy: Secondary | ICD-10-CM

## 2018-06-30 DIAGNOSIS — G40319 Generalized idiopathic epilepsy and epileptic syndromes, intractable, without status epilepticus: Secondary | ICD-10-CM | POA: Diagnosis not present

## 2018-06-30 DIAGNOSIS — F819 Developmental disorder of scholastic skills, unspecified: Secondary | ICD-10-CM

## 2018-06-30 MED ORDER — LAMICTAL 200 MG PO TABS: 200.0000 mg | ORAL_TABLET | Freq: Two times a day (BID) | ORAL | 5 refills | Status: DC

## 2018-06-30 MED ORDER — VIMPAT 100 MG PO TABS: ORAL_TABLET | ORAL | 5 refills | Status: DC

## 2018-06-30 MED ORDER — LAMICTAL 25 MG PO TABS: 25.0000 mg | ORAL_TABLET | Freq: Two times a day (BID) | ORAL | 5 refills | Status: DC

## 2018-06-30 MED ORDER — TRIAMCINOLONE ACETONIDE 0.1 % EX CREA: TOPICAL_CREAM | CUTANEOUS | 5 refills | Status: AC

## 2018-06-30 MED ORDER — KEPPRA 750 MG PO TABS: 750.0000 mg | ORAL_TABLET | Freq: Two times a day (BID) | ORAL | 5 refills | Status: DC

## 2018-06-30 NOTE — Telephone Encounter (Signed)
Mom dropped off Medical Statement for Diet Needs in office today.  Form placed in [employer] Goodpasture's mailbox
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
## 2018-06-30 NOTE — Telephone Encounter (Signed)
Forms have been placed on tina's desk 

## 2018-07-02 ENCOUNTER — Encounter (INDEPENDENT_AMBULATORY_CARE_PROVIDER_SITE_OTHER): Payer: Self-pay | Admitting: Family

## 2018-07-02 NOTE — Patient Instructions (Signed)
Thank you for coming in today.   Instructions for you until your next appointment are as follows: 1. Continue Carlos [name]'s medications as you have been giving them 2. Let me know if his seizures become more frequent or more severe 3. I will write the letter needed for insurance and complete the [employer] forms and let you know when they are ready to be picked up.  4. Please plan to return for follow up in 6 months or sooner if needed.

## 2018-07-05 ENCOUNTER — Encounter (INDEPENDENT_AMBULATORY_CARE_PROVIDER_SITE_OTHER): Payer: Self-pay | Admitting: Family

## 2018-07-05 NOTE — Telephone Encounter (Signed)
Forms completed and given back to Tiffanie to call Mom. TG

## 2018-07-05 NOTE — Telephone Encounter (Signed)
L/M informing mom that the paperwork she dropped off is ready for pick up

## 2018-07-15 ENCOUNTER — Telehealth (INDEPENDENT_AMBULATORY_CARE_PROVIDER_SITE_OTHER): Payer: Self-pay | Admitting: Family

## 2018-07-15 NOTE — Telephone Encounter (Signed)
The number left in the phone message is not working at this time. I left a message at the home phone number

## 2018-07-15 NOTE — Telephone Encounter (Signed)
Who's calling (name and relationship to patient) : Octavio GravesStover,Theresa (Mother) Best contact number: [phone] (M) Provider they see: Inetta Fermoina, NP Reason for call: Mother of patient is calling to check the status of the forms Inetta Fermoina needed to complete.

## 2018-12-26 ENCOUNTER — Other Ambulatory Visit (INDEPENDENT_AMBULATORY_CARE_PROVIDER_SITE_OTHER): Payer: Self-pay | Admitting: Family

## 2018-12-26 DIAGNOSIS — G40319 Generalized idiopathic epilepsy and epileptic syndromes, intractable, without status epilepticus: Secondary | ICD-10-CM

## 2018-12-26 DIAGNOSIS — G40219 Localization-related (focal) (partial) symptomatic epilepsy and epileptic syndromes with complex partial seizures, intractable, without status epilepticus: Secondary | ICD-10-CM

## 2018-12-27 NOTE — Telephone Encounter (Signed)
Please send to pharmacy.

## 2019-01-20 ENCOUNTER — Telehealth (INDEPENDENT_AMBULATORY_CARE_PROVIDER_SITE_OTHER): Payer: Self-pay | Admitting: Family

## 2019-01-20 NOTE — Telephone Encounter (Signed)
°  Who's calling (name and relationship to patient) : De Blanch, healthcare coordinator  Best contact number: [phone]  Provider they see: Elveria Rising  Reason for call: Healthcare coordinator is faxing papers for [place] to fill out, so that Jc can get transportation to come to [employer] which is his daycare location. Putting attn. To Elveria Rising, need this completed as soon as possible, because he hasn't been coming due to lack of transportation. This would help him qualify for transportation to continue coming.     PRESCRIPTION REFILL ONLY  Name of prescription:  Pharmacy:

## 2019-01-21 NOTE — Telephone Encounter (Signed)
The form has been completed. Please fax back to [employer]. Thanks, [name]

## 2019-01-23 ENCOUNTER — Other Ambulatory Visit (INDEPENDENT_AMBULATORY_CARE_PROVIDER_SITE_OTHER): Payer: Self-pay | Admitting: Family

## 2019-01-23 DIAGNOSIS — G40209 Localization-related (focal) (partial) symptomatic epilepsy and epileptic syndromes with complex partial seizures, not intractable, without status epilepticus: Secondary | ICD-10-CM

## 2019-01-23 DIAGNOSIS — G40319 Generalized idiopathic epilepsy and epileptic syndromes, intractable, without status epilepticus: Secondary | ICD-10-CM

## 2019-01-24 ENCOUNTER — Other Ambulatory Visit (INDEPENDENT_AMBULATORY_CARE_PROVIDER_SITE_OTHER): Payer: Self-pay | Admitting: Family

## 2019-01-24 DIAGNOSIS — G40209 Localization-related (focal) (partial) symptomatic epilepsy and epileptic syndromes with complex partial seizures, not intractable, without status epilepticus: Secondary | ICD-10-CM

## 2019-01-24 NOTE — Telephone Encounter (Signed)
This form has been faxed to [employer]

## 2019-01-25 ENCOUNTER — Telehealth (INDEPENDENT_AMBULATORY_CARE_PROVIDER_SITE_OTHER): Payer: Self-pay | Admitting: Family

## 2019-01-25 DIAGNOSIS — G40209 Localization-related (focal) (partial) symptomatic epilepsy and epileptic syndromes with complex partial seizures, not intractable, without status epilepticus: Secondary | ICD-10-CM

## 2019-01-25 DIAGNOSIS — G40319 Generalized idiopathic epilepsy and epileptic syndromes, intractable, without status epilepticus: Secondary | ICD-10-CM

## 2019-01-25 MED ORDER — LAMICTAL 200 MG PO TABS: 200.0000 mg | ORAL_TABLET | Freq: Two times a day (BID) | ORAL | 5 refills | Status: DC

## 2019-01-25 MED ORDER — KEPPRA 750 MG PO TABS: 750.0000 mg | ORAL_TABLET | Freq: Two times a day (BID) | ORAL | 5 refills | Status: DC

## 2019-01-25 MED ORDER — LAMICTAL 25 MG PO TABS: 25.0000 mg | ORAL_TABLET | Freq: Two times a day (BID) | ORAL | 5 refills | Status: DC

## 2019-01-25 NOTE — Telephone Encounter (Signed)
°  Who's calling (name and relationship to patient) : Keri, Pharmacy Tech  Best contact number: [phone]  Provider they see: Elveria Rising  Reason for call: Keri from [facility] is stating that they received multiple prescriptions for brand name Lamictal 25mg  ad 200mg  and Keppra 750mg , and need these written by hand by provider, and must state brand name medically necessary, reason for this is because patient is on Medicaid and that's the only Medicaid will cover it. Please fax this to [phone].    PRESCRIPTION REFILL ONLY  Name of prescription:  Pharmacy:

## 2019-01-25 NOTE — Telephone Encounter (Signed)
The Rx was faxed to the pharmacy. TG ?

## 2019-07-03 ENCOUNTER — Other Ambulatory Visit (INDEPENDENT_AMBULATORY_CARE_PROVIDER_SITE_OTHER): Payer: Self-pay | Admitting: Family

## 2019-07-03 DIAGNOSIS — G40319 Generalized idiopathic epilepsy and epileptic syndromes, intractable, without status epilepticus: Secondary | ICD-10-CM

## 2019-07-03 DIAGNOSIS — G40219 Localization-related (focal) (partial) symptomatic epilepsy and epileptic syndromes with complex partial seizures, intractable, without status epilepticus: Secondary | ICD-10-CM

## 2019-07-08 ENCOUNTER — Telehealth (INDEPENDENT_AMBULATORY_CARE_PROVIDER_SITE_OTHER): Payer: Self-pay | Admitting: Family

## 2019-07-08 DIAGNOSIS — K5900 Constipation, unspecified: Secondary | ICD-10-CM

## 2019-07-08 DIAGNOSIS — G40319 Generalized idiopathic epilepsy and epileptic syndromes, intractable, without status epilepticus: Secondary | ICD-10-CM

## 2019-07-08 DIAGNOSIS — G40209 Localization-related (focal) (partial) symptomatic epilepsy and epileptic syndromes with complex partial seizures, not intractable, without status epilepticus: Secondary | ICD-10-CM

## 2019-07-08 MED ORDER — LAMICTAL 200 MG PO TABS: 200.0000 mg | ORAL_TABLET | Freq: Two times a day (BID) | ORAL | 0 refills | Status: DC

## 2019-07-08 MED ORDER — KEPPRA 750 MG PO TABS: 750.0000 mg | ORAL_TABLET | Freq: Two times a day (BID) | ORAL | 0 refills | Status: DC

## 2019-07-08 MED ORDER — LAMICTAL 25 MG PO TABS: 25.0000 mg | ORAL_TABLET | Freq: Two times a day (BID) | ORAL | 0 refills | Status: DC

## 2019-07-08 NOTE — Telephone Encounter (Signed)
°  Who's calling (name and relationship to patient) : Clarene Critchley (Mother)  Best contact number: [phone] Provider they see: Otila Kluver Reason for call: Mother stated pt needs refill on all meds. Mom aware pt needs appt in order for them to be filled. Appt sched on 8/25. Mom would like emergency refill on Lamictal to last until appt.      PRESCRIPTION REFILL ONLY  Name of prescription:  Pharmacy: [facility]

## 2019-07-08 NOTE — Telephone Encounter (Signed)
L/M informing mom that the refills have been sent to the pharmacy 

## 2019-07-08 NOTE — Telephone Encounter (Signed)
Please let Mom know that the refills have been sent in. Thanks, Otila Kluver

## 2019-07-12 ENCOUNTER — Ambulatory Visit (INDEPENDENT_AMBULATORY_CARE_PROVIDER_SITE_OTHER): Admitting: Family

## 2019-07-12 ENCOUNTER — Encounter (INDEPENDENT_AMBULATORY_CARE_PROVIDER_SITE_OTHER): Payer: Self-pay | Admitting: Family

## 2019-07-12 ENCOUNTER — Other Ambulatory Visit: Payer: Self-pay

## 2019-07-12 DIAGNOSIS — K5900 Constipation, unspecified: Secondary | ICD-10-CM | POA: Diagnosis not present

## 2019-07-12 DIAGNOSIS — G40319 Generalized idiopathic epilepsy and epileptic syndromes, intractable, without status epilepticus: Secondary | ICD-10-CM | POA: Diagnosis not present

## 2019-07-12 DIAGNOSIS — G40219 Localization-related (focal) (partial) symptomatic epilepsy and epileptic syndromes with complex partial seizures, intractable, without status epilepticus: Secondary | ICD-10-CM

## 2019-07-12 DIAGNOSIS — G808 Other cerebral palsy: Secondary | ICD-10-CM

## 2019-07-12 DIAGNOSIS — F819 Developmental disorder of scholastic skills, unspecified: Secondary | ICD-10-CM

## 2019-07-12 DIAGNOSIS — G40209 Localization-related (focal) (partial) symptomatic epilepsy and epileptic syndromes with complex partial seizures, not intractable, without status epilepticus: Secondary | ICD-10-CM

## 2019-07-12 MED ORDER — LAMICTAL 25 MG PO TABS: 25.0000 mg | ORAL_TABLET | Freq: Two times a day (BID) | ORAL | 5 refills | Status: DC

## 2019-07-12 MED ORDER — LACTULOSE 10 GM/15ML PO SOLN: ORAL | 5 refills | Status: DC

## 2019-07-12 MED ORDER — KEPPRA 750 MG PO TABS: 750.0000 mg | ORAL_TABLET | Freq: Two times a day (BID) | ORAL | 5 refills | Status: DC

## 2019-07-12 MED ORDER — LAMICTAL 200 MG PO TABS: 200.0000 mg | ORAL_TABLET | Freq: Two times a day (BID) | ORAL | 5 refills | Status: DC

## 2019-07-12 MED ORDER — VIMPAT 100 MG PO TABS: ORAL_TABLET | ORAL | 5 refills | Status: DC

## 2019-07-12 NOTE — Patient Instructions (Addendum)
Thank you for meeting with me by Webex today.   Instructions for you until your next appointment are as follows: 1. Continue Carlos [name]'s seizure medications as you have been giving them.  2. Let me know if his seizures become more frequent or more severe 3. I sent in a prescription for Lactulose. Give Carlos [name] 16ml by mouth twice per day. If his bowel movements become more regular, try decreasing the dose to 60ml once per day.  4. If the Lactulose 70ml twice per day doesn't help to make his bowel movements more regular, we can increase the dose. Just let me know how things are going.  5. Remember that it is important for Carlos [name] to drink plenty of water and to eat foods that are high in fiber - these things will also help with constipation. I have added some information below regarding a high fiber diet for you. 6. Please sign up for MyChart if you have not done so 7. Please plan to return for follow up in 6 months or sooner if needed.   High-Fiber Diet Fiber, also called dietary fiber, is a type of carbohydrate that is found in fruits, vegetables, whole grains, and beans. A high-fiber diet can have many health benefits. Your health care provider may recommend a high-fiber diet to help:  Prevent constipation. Fiber can make your bowel movements more regular.  Lower your cholesterol.  Relieve the following conditions: ? Swelling of veins in the anus (hemorrhoids). ? Swelling and irritation (inflammation) of specific areas of the digestive tract (uncomplicated diverticulosis). ? A problem of the large intestine (colon) that sometimes causes pain and diarrhea (irritable bowel syndrome, IBS).  Prevent overeating as part of a weight-loss plan.  Prevent heart disease, type 2 diabetes, and certain cancers. What is my plan? The recommended daily fiber intake in grams (g) includes:  38 g for men age 69 or younger.  30 g for men over age 73.  52 g for women age 64 or younger.  21 g for women  over age 48. You can get the recommended daily intake of dietary fiber by:  Eating a variety of fruits, vegetables, grains, and beans.  Taking a fiber supplement, if it is not possible to get enough fiber through your diet. What do I need to know about a high-fiber diet?  It is better to get fiber through food sources rather than from fiber supplements. There is not a lot of research about how effective supplements are.  Always check the fiber content on the nutrition facts label of any prepackaged food. Look for foods that contain 5 g of fiber or more per serving.  Talk with a diet and nutrition specialist (dietitian) if you have questions about specific foods that are recommended or not recommended for your medical condition, especially if those foods are not listed below.  Gradually increase how much fiber you consume. If you increase your intake of dietary fiber too quickly, you may have bloating, cramping, or gas.  Drink plenty of water. Water helps you to digest fiber. What are tips for following this plan?  Eat a wide variety of high-fiber foods.  Make sure that half of the grains that you eat each day are whole grains.  Eat breads and cereals that are made with whole-grain flour instead of refined flour or white flour.  Eat brown rice, bulgur wheat, or millet instead of white rice.  Start the day with a breakfast that is high in fiber, such as a cereal  that contains 5 g of fiber or more per serving.  Use beans in place of meat in soups, salads, and pasta dishes.  Eat high-fiber snacks, such as berries, raw vegetables, nuts, and popcorn.  Choose whole fruits and vegetables instead of processed forms like juice or sauce. What foods can I eat?  Fruits Berries. Pears. Apples. Oranges. Avocado. Prunes and raisins. Dried figs. Vegetables Sweet potatoes. Spinach. Kale. Artichokes. Cabbage. Broccoli. Cauliflower. Green peas. Carrots. Squash. Grains Whole-grain breads.  Multigrain cereal. Oats and oatmeal. Brown rice. Barley. Bulgur wheat. Millet. Quinoa. Bran muffins. Popcorn. Rye wafer crackers. Meats and other proteins Navy, kidney, and pinto beans. Soybeans. Split peas. Lentils. Nuts and seeds. Dairy Fiber-fortified yogurt. Beverages Fiber-fortified soy milk. Fiber-fortified orange juice. Other foods Fiber bars. The items listed above may not be a complete list of recommended foods and beverages. Contact a dietitian for more options. What foods are not recommended? Fruits Fruit juice. Cooked, strained fruit. Vegetables Fried potatoes. Canned vegetables. Well-cooked vegetables. Grains White bread. Pasta made with refined flour. White rice. Meats and other proteins Fatty cuts of meat. Fried chicken or fried fish. Dairy Milk. Yogurt. Cream cheese. Sour cream. Fats and oils Butters. Beverages Soft drinks. Other foods Cakes and pastries. The items listed above may not be a complete list of foods and beverages to avoid. Contact a dietitian for more information. Summary  Fiber is a type of carbohydrate. It is found in fruits, vegetables, whole grains, and beans.  There are many health benefits of eating a high-fiber diet, such as preventing constipation, lowering blood cholesterol, helping with weight loss, and reducing your risk of heart disease, diabetes, and certain cancers.  Gradually increase your intake of fiber. Increasing too fast can result in cramping, bloating, and gas. Drink plenty of water while you increase your fiber.  The best sources of fiber include whole fruits and vegetables, whole grains, nuts, seeds, and beans. This information is not intended to replace advice given to you by your health care provider. Make sure you discuss any questions you have with your health care provider. Document Released: 11/03/2005 Document Revised: 09/07/2017 Document Reviewed: 09/07/2017 Elsevier Patient Education  2020 [employer]
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
## 2019-07-13 ENCOUNTER — Encounter (INDEPENDENT_AMBULATORY_CARE_PROVIDER_SITE_OTHER): Payer: Self-pay | Admitting: Family

## 2019-08-30 ENCOUNTER — Other Ambulatory Visit (INDEPENDENT_AMBULATORY_CARE_PROVIDER_SITE_OTHER): Payer: Self-pay | Admitting: Family

## 2019-08-30 DIAGNOSIS — G40319 Generalized idiopathic epilepsy and epileptic syndromes, intractable, without status epilepticus: Secondary | ICD-10-CM

## 2020-01-11 ENCOUNTER — Other Ambulatory Visit (INDEPENDENT_AMBULATORY_CARE_PROVIDER_SITE_OTHER): Payer: Self-pay | Admitting: Family

## 2020-01-11 DIAGNOSIS — G40219 Localization-related (focal) (partial) symptomatic epilepsy and epileptic syndromes with complex partial seizures, intractable, without status epilepticus: Secondary | ICD-10-CM

## 2020-01-11 DIAGNOSIS — G40319 Generalized idiopathic epilepsy and epileptic syndromes, intractable, without status epilepticus: Secondary | ICD-10-CM

## 2020-01-11 NOTE — Telephone Encounter (Signed)
Please send to the pharmacy °

## 2020-03-18 ENCOUNTER — Other Ambulatory Visit (INDEPENDENT_AMBULATORY_CARE_PROVIDER_SITE_OTHER): Payer: Self-pay | Admitting: Family

## 2020-03-18 DIAGNOSIS — G40319 Generalized idiopathic epilepsy and epileptic syndromes, intractable, without status epilepticus: Secondary | ICD-10-CM

## 2020-03-18 DIAGNOSIS — G40209 Localization-related (focal) (partial) symptomatic epilepsy and epileptic syndromes with complex partial seizures, not intractable, without status epilepticus: Secondary | ICD-10-CM

## 2020-03-19 NOTE — Telephone Encounter (Signed)
Please send to the pharmacy °

## 2020-07-07 ENCOUNTER — Inpatient Hospital Stay (HOSPITAL_COMMUNITY)
Admission: EM | Admit: 2020-07-07 | Discharge: 2020-07-11 | DRG: 100 | Disposition: A | Discharge status: Home / Self Care | Attending: Family Medicine | Admitting: Family Medicine

## 2020-07-07 ENCOUNTER — Encounter (HOSPITAL_COMMUNITY): Payer: Self-pay | Admitting: Family Medicine

## 2020-07-07 ENCOUNTER — Observation Stay (HOSPITAL_COMMUNITY)

## 2020-07-07 ENCOUNTER — Emergency Department (HOSPITAL_COMMUNITY)

## 2020-07-07 ENCOUNTER — Other Ambulatory Visit: Payer: Self-pay

## 2020-07-07 DIAGNOSIS — F819 Developmental disorder of scholastic skills, unspecified: Secondary | ICD-10-CM | POA: Diagnosis present

## 2020-07-07 DIAGNOSIS — Q040 Congenital malformations of corpus callosum: Secondary | ICD-10-CM

## 2020-07-07 DIAGNOSIS — Z79899 Other long term (current) drug therapy: Secondary | ICD-10-CM | POA: Diagnosis not present

## 2020-07-07 DIAGNOSIS — G800 Spastic quadriplegic cerebral palsy: Secondary | ICD-10-CM | POA: Diagnosis not present

## 2020-07-07 DIAGNOSIS — R1311 Dysphagia, oral phase: Secondary | ICD-10-CM

## 2020-07-07 DIAGNOSIS — R739 Hyperglycemia, unspecified: Secondary | ICD-10-CM | POA: Diagnosis not present

## 2020-07-07 DIAGNOSIS — Z4659 Encounter for fitting and adjustment of other gastrointestinal appliance and device: Secondary | ICD-10-CM

## 2020-07-07 DIAGNOSIS — G40919 Epilepsy, unspecified, intractable, without status epilepticus: Secondary | ICD-10-CM | POA: Diagnosis not present

## 2020-07-07 DIAGNOSIS — L309 Dermatitis, unspecified: Secondary | ICD-10-CM | POA: Diagnosis present

## 2020-07-07 DIAGNOSIS — D509 Iron deficiency anemia, unspecified: Secondary | ICD-10-CM | POA: Diagnosis present

## 2020-07-07 DIAGNOSIS — K3189 Other diseases of stomach and duodenum: Secondary | ICD-10-CM | POA: Diagnosis present

## 2020-07-07 DIAGNOSIS — R569 Unspecified convulsions: Secondary | ICD-10-CM | POA: Diagnosis present

## 2020-07-07 DIAGNOSIS — G40901 Epilepsy, unspecified, not intractable, with status epilepticus: Secondary | ICD-10-CM | POA: Diagnosis present

## 2020-07-07 DIAGNOSIS — E8809 Other disorders of plasma-protein metabolism, not elsewhere classified: Secondary | ICD-10-CM | POA: Diagnosis not present

## 2020-07-07 DIAGNOSIS — G40319 Generalized idiopathic epilepsy and epileptic syndromes, intractable, without status epilepticus: Secondary | ICD-10-CM | POA: Diagnosis present

## 2020-07-07 DIAGNOSIS — G809 Cerebral palsy, unspecified: Secondary | ICD-10-CM | POA: Diagnosis present

## 2020-07-07 DIAGNOSIS — R718 Other abnormality of red blood cells: Secondary | ICD-10-CM | POA: Diagnosis present

## 2020-07-07 DIAGNOSIS — N179 Acute kidney failure, unspecified: Secondary | ICD-10-CM | POA: Diagnosis not present

## 2020-07-07 DIAGNOSIS — F79 Unspecified intellectual disabilities: Secondary | ICD-10-CM | POA: Diagnosis not present

## 2020-07-07 DIAGNOSIS — Z20822 Contact with and (suspected) exposure to covid-19: Secondary | ICD-10-CM | POA: Diagnosis present

## 2020-07-07 DIAGNOSIS — G40209 Localization-related (focal) (partial) symptomatic epilepsy and epileptic syndromes with complex partial seizures, not intractable, without status epilepticus: Secondary | ICD-10-CM | POA: Diagnosis present

## 2020-07-07 DIAGNOSIS — R5383 Other fatigue: Secondary | ICD-10-CM | POA: Diagnosis present

## 2020-07-07 DIAGNOSIS — D72829 Elevated white blood cell count, unspecified: Secondary | ICD-10-CM | POA: Diagnosis present

## 2020-07-07 DIAGNOSIS — G40211 Localization-related (focal) (partial) symptomatic epilepsy and epileptic syndromes with complex partial seizures, intractable, with status epilepticus: Secondary | ICD-10-CM | POA: Diagnosis not present

## 2020-07-07 DIAGNOSIS — G40219 Localization-related (focal) (partial) symptomatic epilepsy and epileptic syndromes with complex partial seizures, intractable, without status epilepticus: Secondary | ICD-10-CM

## 2020-07-07 DIAGNOSIS — K5900 Constipation, unspecified: Secondary | ICD-10-CM | POA: Diagnosis present

## 2020-07-07 DIAGNOSIS — G808 Other cerebral palsy: Secondary | ICD-10-CM | POA: Diagnosis present

## 2020-07-07 LAB — COMPREHENSIVE METABOLIC PANEL
ALT: 10 U/L (ref 0–44)
AST: 29 U/L (ref 15–41)
Albumin: 4.9 g/dL (ref 3.5–5.0)
Alkaline Phosphatase: 68 U/L (ref 38–126)
Anion gap: 16 — ABNORMAL HIGH (ref 5–15)
BUN: 24 mg/dL — ABNORMAL HIGH (ref 6–20)
CO2: 22 mmol/L (ref 22–32)
Calcium: 10.9 mg/dL — ABNORMAL HIGH (ref 8.9–10.3)
Chloride: 106 mmol/L (ref 98–111)
Creatinine, Ser: 1.39 mg/dL — ABNORMAL HIGH (ref 0.61–1.24)
GFR calc Af Amer: >60 mL/min (ref >60)
GFR calc non Af Amer: >60 mL/min (ref >60)
Glucose, Bld: 88 mg/dL (ref 70–99)
Potassium: 4.4 mmol/L (ref 3.5–5.1)
Sodium: 144 mmol/L (ref 135–145)
Total Bilirubin: 0.4 mg/dL (ref 0.3–1.2)
Total Protein: 9.3 g/dL — ABNORMAL HIGH (ref 6.5–8.1)

## 2020-07-07 LAB — CBC WITH DIFFERENTIAL/PLATELET
Abs Immature Granulocytes: 0.06 10*3/uL (ref 0.00–0.07)
Basophils Absolute: 0 10*3/uL (ref 0.0–0.1)
Basophils Relative: 0 %
Eosinophils Absolute: 0 10*3/uL (ref 0.0–0.5)
Eosinophils Relative: 0 %
HCT: 48.8 % (ref 39.0–52.0)
Hemoglobin: 14.9 g/dL (ref 13.0–17.0)
Immature Granulocytes: 0 %
Lymphocytes Relative: 7 %
Lymphs Abs: 0.9 10*3/uL (ref 0.7–4.0)
MCH: 24.1 pg — ABNORMAL LOW (ref 26.0–34.0)
MCHC: 30.5 g/dL (ref 30.0–36.0)
MCV: 79 fL — ABNORMAL LOW (ref 80.0–100.0)
Monocytes Absolute: 0.7 10*3/uL (ref 0.1–1.0)
Monocytes Relative: 5 %
Neutro Abs: 12.3 10*3/uL — ABNORMAL HIGH (ref 1.7–7.7)
Neutrophils Relative %: 88 %
Platelets: 333 10*3/uL (ref 150–400)
RBC: 6.18 MIL/uL — ABNORMAL HIGH (ref 4.22–5.81)
RDW: 14 % (ref 11.5–15.5)
WBC: 14 10*3/uL — ABNORMAL HIGH (ref 4.0–10.5)
nRBC: 0 % (ref 0.0–0.2)

## 2020-07-07 LAB — SARS CORONAVIRUS 2 BY RT PCR (HOSPITAL ORDER, PERFORMED IN ~~LOC~~ HOSPITAL LAB): SARS Coronavirus 2: NEGATIVE

## 2020-07-07 LAB — CBG MONITORING, ED
Glucose-Capillary: 100 mg/dL — ABNORMAL HIGH (ref 70–99)
Glucose-Capillary: 94 mg/dL (ref 70–99)

## 2020-07-07 LAB — CK: Total CK: 1235 U/L — ABNORMAL HIGH (ref 49–397)

## 2020-07-07 MED ORDER — LEVETIRACETAM IN NACL 1500 MG/100ML IV SOLN
1500.0000 mg | Freq: Two times a day (BID) | INTRAVENOUS | Status: DC
  Administered 2020-07-07 – 2020-07-10 (×6): 1500 mg via INTRAVENOUS
  Filled 2020-07-07 (×7): qty 100

## 2020-07-07 MED ORDER — LAMOTRIGINE 25 MG PO TABS
225.0000 mg | ORAL_TABLET | Freq: Two times a day (BID) | ORAL | Status: DC
  Administered 2020-07-10 – 2020-07-11 (×2): 225 mg via ORAL
  Filled 2020-07-07 (×8): qty 1

## 2020-07-07 MED ORDER — SODIUM CHLORIDE 0.9 % IV SOLN
200.0000 mg | Freq: Once | INTRAVENOUS | Status: AC
  Administered 2020-07-07: 200 mg via INTRAVENOUS
  Filled 2020-07-07: qty 20

## 2020-07-07 MED ORDER — IOHEXOL 300 MG/ML  SOLN
75.0000 mL | Freq: Once | INTRAMUSCULAR | Status: AC | PRN
  Administered 2020-07-07: 75 mL via INTRAVENOUS

## 2020-07-07 MED ORDER — PREGABALIN 100 MG PO CAPS: 100.0000 mg | ORAL_CAPSULE | Freq: Once | ORAL | Status: DC

## 2020-07-07 MED ORDER — LORAZEPAM 2 MG/ML IJ SOLN
1.0000 mg | Freq: Once | INTRAMUSCULAR | Status: AC
  Administered 2020-07-07: 1 mg via INTRAVENOUS
  Filled 2020-07-07: qty 1

## 2020-07-07 MED ORDER — FLEET ENEMA 7-19 GM/118ML RE ENEM: 1.0000 | ENEMA | Freq: Every day | RECTAL | Status: DC | PRN

## 2020-07-07 MED ORDER — LORAZEPAM 2 MG/ML IJ SOLN
INTRAMUSCULAR | Status: AC
  Filled 2020-07-07: qty 1

## 2020-07-07 MED ORDER — TRIAMCINOLONE ACETONIDE 0.1 % EX CREA
1.0000 "application " | TOPICAL_CREAM | Freq: Two times a day (BID) | CUTANEOUS | Status: DC
  Administered 2020-07-08 – 2020-07-10 (×4): 1 via TOPICAL
  Filled 2020-07-07: qty 15

## 2020-07-07 MED ORDER — PREGABALIN 75 MG PO CAPS
75.0000 mg | ORAL_CAPSULE | Freq: Three times a day (TID) | ORAL | Status: DC
  Administered 2020-07-07: 75 mg via ORAL
  Filled 2020-07-07: qty 1

## 2020-07-07 MED ORDER — KCL IN DEXTROSE-NACL 20-5-0.45 MEQ/L-%-% IV SOLN
INTRAVENOUS | Status: DC
  Filled 2020-07-07 (×6): qty 1000

## 2020-07-07 MED ORDER — SODIUM CHLORIDE 0.9 % IV SOLN
100.0000 mg | INTRAVENOUS | Status: DC
  Administered 2020-07-07 – 2020-07-10 (×4): 100 mg via INTRAVENOUS
  Filled 2020-07-07 (×4): qty 10

## 2020-07-07 MED ORDER — ENOXAPARIN SODIUM 30 MG/0.3ML ~~LOC~~ SOLN
30.0000 mg | SUBCUTANEOUS | Status: DC
  Administered 2020-07-07 – 2020-07-11 (×5): 30 mg via SUBCUTANEOUS
  Filled 2020-07-07 (×5): qty 0.3

## 2020-07-07 MED ORDER — LORAZEPAM 2 MG/ML IJ SOLN
2.0000 mg | Freq: Once | INTRAMUSCULAR | Status: AC
  Administered 2020-07-07: 2 mg via INTRAVENOUS

## 2020-07-07 MED ORDER — SODIUM CHLORIDE 0.9 % IV SOLN
750.0000 mg | Freq: Two times a day (BID) | INTRAVENOUS | Status: DC
  Administered 2020-07-07: 750 mg via INTRAVENOUS
  Filled 2020-07-07 (×2): qty 7.5

## 2020-07-07 MED ORDER — LAMOTRIGINE 25 MG PO TABS
225.0000 mg | ORAL_TABLET | Freq: Two times a day (BID) | ORAL | Status: DC
  Administered 2020-07-07 – 2020-07-10 (×6): 225 mg
  Filled 2020-07-07 (×7): qty 1

## 2020-07-07 MED ORDER — LACTATED RINGERS IV BOLUS
1000.0000 mL | Freq: Once | INTRAVENOUS | Status: AC
  Administered 2020-07-07: 1000 mL via INTRAVENOUS

## 2020-07-07 MED ORDER — SODIUM CHLORIDE 0.9 % IV SOLN
200.0000 mg | Freq: Every evening | INTRAVENOUS | Status: DC
  Administered 2020-07-07 – 2020-07-09 (×3): 200 mg via INTRAVENOUS
  Filled 2020-07-07 (×4): qty 20

## 2020-07-07 MED ORDER — LEVETIRACETAM IN NACL 1000 MG/100ML IV SOLN
1000.0000 mg | Freq: Once | INTRAVENOUS | Status: AC
  Administered 2020-07-07: 1000 mg via INTRAVENOUS
  Filled 2020-07-07: qty 100

## 2020-07-07 MED ORDER — POLYETHYLENE GLYCOL 3350 17 G PO PACK: 17.0000 g | PACK | Freq: Every day | ORAL | Status: DC

## 2020-07-07 NOTE — Progress Notes (Addendum)
Family Medicine Teaching Service Daily Progress Note Intern Pager: [phone]  Patient name: Carlos [name] Medical record number: [id number] Date of birth: 26-Apr-1986 Age: 34 y.o. Gender: male  Primary Care Provider: Reece Leader, DO Consultants: Neuro Code Status: FULL  Pt Overview and Major Events to Date:  8/21 Admitted  Assessment and Plan: Carlos [name] is a 34 y.o. male presenting with seizures . 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  Status Epilepticus Initially sleeping this morning but opens eyes to name. Underlying history of seizures presenting with multiple seizures (over 12 seizures in the past 2 days) likely secondary to being unable to tolerate p.o. medications due to nausea, vomiting, constipation resulting in him not being able to keep his seizure medications down. VSS wnl. Neurology following, appreciate involvement.  Currently n.p.o. given postictal state, so will need to receive AEDs via IV, and NG tube placed for lamotrigine.  We will plan to switch to p.o. meds when able. - Neurology on board, appreciate guidance - f/u cEEG -D51/2NS+45mEq KCl @ 3mL/hr - NG tube successfully placed 8/21 - IV Levetiracetam 750 mg BID - IV Lacosamide 100 mg AM + 200 mg PM - Po Lamotrigine 225 mg BID via NGT - daily CBC - seizure precautions - cardiac monitoring, continuous pulse ox - frequent neuro checks - frequent vitals  -Follow-up Keppra, Lamictal levels  Constipation Presented with 5 days of constipation.  CT A/P showing significant stool volume and dilated bowel. Abdominal soft and bowel sounds present. No bowel movement documented at this time. - FLEET enema - Can also administer MiraLAX via NG tube  AKI, resolved.  Elevated Cr 1.39 on presentation, now at baseline 0.75 this morning. Baseline around 0.7-0.8. Suspect pre-renal in the setting  of vomiting but possible intra-renal, will consider rhabdomyolysis in the setting of seizure given elevated CK, though mildly elevated at 1,235. - IV fluids as above - f/u UA - daily BMP  Eczema Patient has a history of eczema on skin.  Not appreciated this admission.  Uses triamcinolone at home at baseline. -Triamcinolone ordered as needed twice daily to be applied eczema  FEN/GI: NPO, D5 1/2 NS + KCl @ 75 ml/hr Prophylaxis: enoxaparin   Disposition: Continued medical care for status epilepticus   Subjective:  Initially sleeping when entering the room. Easily aroused and nods to conversation.  Objective: Temp:  [97.8 F (36.6 C)-98.7 F (37.1 C)] 98.5 F (36.9 C) (08/21 2030) Pulse Rate:  [79-132] 107 (08/21 2030) Resp:  [8-29] 16 (08/21 2030) BP: (107-149)/(67-94) 140/94 (08/21 2030) SpO2:  [89 %-100 %] 100 % (08/21 2030) Weight:  [49 kg] 49 kg (08/21 0335)  Physical Exam: General: No acute distress. Age appropriate. Initially sleeping, easily aroused. Cardiac: RRR, normal heart sounds, no murmurs Respiratory: CTAB, normal effort Abdomen: soft, nondistended, +bowel sounds Extremities: No edema or cyanosis. LE cold but post. Tibial pulse normal Skin: Warm and dry, no rashes noted Neuro: alert, left arm flexed contracted position. Blinks and nods to conversation. Lower Ext. flexed, contracted and deviated to the right. Moves head side to side.  Laboratory: Recent Labs  Lab 07/07/20 0132 07/08/20 0225  WBC 14.0* 5.9  HGB 14.9 11.9*  HCT 48.8 38.3*  PLT 333 245   Recent Labs  Lab 07/07/20 0132 07/08/20 0225  NA 144 142  K 4.4 3.5  CL 106 108  CO2 22 26  BUN 24* 13  CREATININE 1.39* 0.75  CALCIUM 10.9* 9.0  PROT 9.3*  --   BILITOT 0.4  --   ALKPHOS 68  --   ALT 10  --   AST 29  --   GLUCOSE 88 103*    Imaging/Diagnostic Tests: No new imaging  Carlos Jumbo, DO 07/07/2020, 10:10 PM PGY-2,  Family Medicine FPTS Intern pager:  [phone], text pages welcome

## 2020-07-07 NOTE — Progress Notes (Signed)
Patient arrived to unit. Alert to voice. Vital signs stable. Father at bedside. Bed left in lowest position, alarm on, callbell within reach. Will continue to monitor.  Melony Overly, RN

## 2020-07-07 NOTE — Progress Notes (Signed)
vLTM started  Neurology notified. Event button tested  Pt  In ER

## 2020-07-07 NOTE — H&P (Addendum)
Family Medicine [facility] Admission History and Physical Service Pager: [phone]  Patient name: Carlos [name] Medical record number: [id number] Date of birth: 1986-09-21 Age: 34 y.o. Gender: male  Primary Care Provider: Reece Leader, DO Consultants: Neurology Code Status: FULL Preferred Emergency Contact: Kainen Struckman, father [phone]) Donyell Ding, mother [phone])  Chief Complaint: Seizures  Assessment and Plan: Carlos [name] is a 34 y.o. male presenting with seizures . 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  Status Epilepticus Patient with underlying history of seizures presenting with multiple seizures (over 12 seizures in the past 2 days) likely secondary to being unable to tolerate p.o. medications due to nausea, vomiting, constipation resulting in him not being able to keep his seizure medications down.  Tachycardic with leukocytosis and elevated CK 1200 likely secondary to seizure.  S/p 2 mg of lorazepam, 1000 mg of levetiracetam, and 200 mg lacosamide.  Currently still in postictal state.  Neurology following, appreciate involvement.  Currently n.p.o. given postictal state, so will need to receive AEDs via IV, and will need NG tube for lamotrigine.  We will plan to switch to p.o. meds when able. - admit to progressive, FPTS, Dr. McDiarmid - Neurology on board, appreciate guidance - f/u cEEG - NG tube successfully placed 8/21 - IV Levetiracetam 750 mg BID - IV Lacosamide 100 mg AM + 200 mg PM - Po Lamotrigine 225 mg BID via NGT - daily CBC - seizure precautions - cardiac monitoring, continuous pulse ox - frequent neuro checks - frequent vitals  -Follow-up Keppra, Lamictal levels  Constipation Presented with 5 days of constipation.  CT A/P showing significant stool volume and dilated bowel. - FLEET enema - Can also  administer MiraLAX via NG tube  AKI Elevated Cr 1.39 on presentation, baseline around 0.7-0.8 based on values from 3 years prior. Suspect pre-renal in the setting of vomiting but possible intra-renal, will consider rhabdomyolysis in the setting of seizure given elevated CK, though mildly elevated at 1,235. - IV fluids D5 half-normal saline with KCl at 75 cc/hour - UA - daily BMP  Eczema: Patient has a history of eczema on skin.  Not appreciated this admission.  Uses triamcinolone at home at baseline. -Triamcinolone ordered as needed twice daily to be applied eczema  FEN/GI: NPO, D5 1/2 NS + KCl @ 75 ml/hr Prophylaxis: enoxaparin  Disposition: progressive  History of Present Illness:  Carlos [name] is a 34 y.o. male presenting with seizures.  Patient seen alone in ED with EEG monitors, father not at bedside.  Patient unable to give history given altered mental status and be at baseline.  Father not at bedside, history obtained from ED provider and neurology note.   Patient apparently had over 12 grand mal seizures in the last 48 hours with the longest going on for over 2 minutes.  He had 3 on the way to the hospital and 1 in the ED, not quite back to his baseline.  Over the past 5 days, patient has been constipated with gas and distention and has been vomiting as well.  Apparently he vomited yesterday about 15 minutes after his morning medications, so there is concern he has not been getting all his seizure medications.  On vomiting the last couple days, gas and distention, didn't tolerate NG tube.   In the ED, he was given Ativan 2 mg and loaded with Keppra 1000 mg.  He has been evaluated by neurology and has been  put on continuous EEG.  Review Of Systems: Per HPI with the following additions: mute at baseline  Review of Systems   Patient Active Problem List   Diagnosis Date Noted  . AKI (acute kidney injury) (HCC) 07/07/2020  . Hypercalcemia 07/07/2020  . Hyperproteinemia 07/07/2020   . RBC microcytosis 07/07/2020  . 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  . Status epilepticus (HCC)   . Health maintenance examination 08/14/2016  . Breakthrough seizure (HCC) 04/23/2016  . 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 Colonic constipation 07/25/2015  . Partial epilepsy with impairment of consciousness, intractable (HCC) 03/14/2013  . 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HCC) 03/14/2013  . Scoliosis associated with other condition 03/14/2013  . Amblyopia 11/03/2012  . URINARY INCONTINENCE, MIXED 08/01/2009  . Intellectual delay 01/14/2007  . Infantile cerebral palsy (HCC) 01/14/2007  . ECZEMA, ATOPIC DERMATITIS 01/14/2007    Past Medical History: Past Medical History:  Diagnosis Date  . Agenesis of corpus callosum (HCC)   . Cerebral palsy (HCC)   . Folliculitis barbae 07/10/2011  . Groin abscess 03/02/2008  . Left hip pain 02/05/2016  . Pneumonia   . Seizures (HCC)   . Status epilepticus (HCC)   . Swelling of joint of pelvic region or thigh 02/05/2016  . Ventriculomegaly of brain, congenital [facility])     Past Surgical History: Past Surgical History:  Procedure Laterality Date  . STRABISMUS SURGERY     in [place]    Social History: Social History   Tobacco Use  . Smoking status: Passive Smoke Exposure - Never Smoker  . Smokeless tobacco: Never Used  Substance Use Topics  . Alcohol use: No    Alcohol/week: 0.0 standard drinks  . Drug use: No   Additional social history: not assessed  Please also refer to relevant sections of EMR.  Family History: Family History  Problem Relation Age of Onset  . Cancer Paternal Grandfather        Died at 37   Allergies and Medications: No Known Allergies No current facility-administered medications on file prior to encounter.   Current Outpatient Medications on File Prior to Encounter  Medication Sig Dispense Refill  . cetirizine HCl (ZYRTEC) 5  MG/5ML SOLN Take 10 mLs (10 mg total) by mouth daily as needed for itching. 300 mL 0  . KEPPRA 750 MG tablet TAKE 1 TABLET BY MOUTH TWICE DAILY. (Patient taking differently: Take 750 mg by mouth 2 (two) times daily. ) 60 tablet 3  . lactulose (CHRONULAC) 10 GM/15ML solution Give 67ml by mouth twice per day (Patient taking differently: Take 10 g by mouth 2 (two) times daily as needed for mild constipation. ) 473 mL 5  . LAMICTAL 200 MG tablet TAKE 1 TABLET BY MOUTH TWICE DAILY. (Patient taking differently: Take 200 mg by mouth 2 (two) times daily. Take with lamictal 25mg  to equal 225 mg) 60 tablet 3  . LAMICTAL 25 MG tablet TAKE 1 TABLET BY MOUTH TWICE DAILY. (Patient taking differently: Take 25 mg by mouth 2 (two) times daily. Take with lamictal 200mg  to equal 225 mg) 60 tablet 3  . polyethylene glycol (MIRALAX / GLYCOLAX) 17 g packet Take 17 g by mouth daily as needed for mild constipation.    . sodium phosphate Pediatric (FLEET) 3.5-9.5 GM/59ML enema Place 1 enema rectally every three (3) days as needed for constipation.    . triamcinolone cream (KENALOG) 0.1 % Apply small amount to rash twice per day (Patient taking differently:  Apply 1 application topically 2 (two) times daily. ) 80 g 5  . VIMPAT 100 MG TABS TAKE 1 TABLET IN THE MORNING AND 2 TABLETS IN THE EVENING. (Patient taking differently: Take 100-200 mg by mouth See admin instructions. TAKE 1 TABLET IN THE MORNING AND 2 TABLETS IN THE EVENING.) 90 tablet 5  . polyethylene glycol powder (GLYCOLAX/MIRALAX) powder DISSOLVE ONE CAPFUL IN 8 0Z. WATER TWICE DAILY (Patient not taking: Reported on 07/07/2020) 527 g 6    Objective: BP 119/74   Pulse (!) 107   Temp 98.4 F (36.9 C) (Oral)   Resp 16   Ht 5\' 6"  (1.676 m)   Wt 49 kg   SpO2 98%   BMI 17.43 kg/m  Exam: General: Awake, somnolent, lying in bed with EEG monitors, NAD Eyes: PERRL, right eye conjunctiva injected ENTM: [place]/AT Neck: supple, no LAD Cardiovascular: Tachycardic, regular  rhythm, no murmurs Respiratory: CTAB, breathing comfortably on room air Gastrointestinal: soft, non-tender, +BS MSK: contractures in all extremities, bilateral feet everted Derm: warm and dry Neuro: spastic tone, worse in lower extremities  Labs and Imaging: CBC BMET  Recent Labs  Lab 07/07/20 0132  WBC 14.0*  HGB 14.9  HCT 48.8  PLT 333   Recent Labs  Lab 07/07/20 0132  NA 144  K 4.4  CL 106  CO2 22  BUN 24*  CREATININE 1.39*  GLUCOSE 88  CALCIUM 10.9*     EKG:  Date/Time:  Saturday July 07 2020 02:04:34 EDT Ventricular Rate:  86 PR Interval:    QRS Duration: 88 QT Interval:  343 QTC Calculation: 411 R Axis:   22 Text Interpretation: Sinus arrhythmia Low voltage, extremity leads ST elev, probable normal early repol pattern No significant change since last tracing Confirmed by 08-18-1998 [phone]) on 07/07/2020 2:21:13 AM  CT ABDOMEN PELVIS W CONTRAST  Result Date: 07/07/2020 CLINICAL DATA:  Constipation and emesis EXAM: CT ABDOMEN AND PELVIS WITH CONTRAST TECHNIQUE: Multidetector CT imaging of the abdomen and pelvis was performed using the standard protocol following bolus administration of intravenous contrast. CONTRAST:  14mL OMNIPAQUE IOHEXOL 300 MG/ML  SOLN COMPARISON:  None. FINDINGS: LOWER CHEST: Normal. HEPATOBILIARY: There are multiple hepatic hemangiomas, the largest measuring 2.7 cm. Nonspecific hypodensity at the hepatic dome measures 11 mm. The gallbladder is normal. PANCREAS: Normal pancreas. No ductal dilatation or peripancreatic fluid collection. SPLEEN: Normal. ADRENALS/URINARY TRACT: The adrenal glands are normal. No hydronephrosis, nephroureterolithiasis or solid renal mass. The urinary bladder is normal for degree of distention STOMACH/BOWEL: No small bowel dilatation or inflammation. Moderate colonic stool volume. The stomach is markedly dilated. Appendix not visualized. VASCULAR/LYMPHATIC: Normal course and caliber of the major abdominal vessels. No  abdominal or pelvic lymphadenopathy. REPRODUCTIVE: Normal prostate size with symmetric seminal vesicles. MUSCULOSKELETAL. No bony spinal canal stenosis or focal osseous abnormality. OTHER: None. IMPRESSION: 1. Markedly dilated stomach with moderate colonic stool volume. 2. Multiple hepatic hemangiomas. 3. Moderate colonic stool volume. Electronically Signed   By: 73m M.D.   On: 07/07/2020 05:35     07/09/2020, MD 07/07/2020, 12:28 PM PGY-1, [facility] FPTS Intern pager: [phone], text pages welcome   FPTS Upper-Level Resident Addendum  I have independently interviewed and examined the patient. I have discussed the above with the original author and agree with their documentation. My edits for correction/addition/clarification are in italics. Please see also any attending notes.    [phone], DO PGY-3, [facility] 07/07/2020 8:07 PM  FPTS Service pager:  [phone] (text pages welcome through [facility])

## 2020-07-07 NOTE — Consult Note (Addendum)
NEUROLOGY CONSULTATION NOTE   Date of service: July 07, 2020 Patient Name: Carlos [name] MRN:  [id number] DOB:  1986-10-22 Reason for consult: "multiple seizures"  History of Present Illness  Carlos [name] is a 34 y.o. 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  Dad at bedside reports that Carlos [name] has had constipation for the last 5 days and has not pooped. That is very unusual for him. He has also been throwing up. He vomited yesterday about 15 mins after his morning medications and was drooling today in the evening after his evening medications and some of the medications fell out of his mouth. Dad also reports that Carlos [name] has been warm to touch over the last 2 days. He has also not been eating and drinking much even thou family has been trying hard.  He has had over 12 Grand mal seizures in the last 48 hours with the longest going on for over 2 mins. He had 3 on the way to the hospital and one in the ED. Dad reports that Carlos [name] is not quite back to his baseline. He typically seems to be waking up after 20 mins of seizures.  In the ED, he was given Ativan 2mg  and loaded with Keppra 1000mg .   ROS   He is mute at baseline, does not appear to be in pain.  Past History   Past Medical History:  Diagnosis Date  . Agenesis of corpus callosum (HCC)   . Cerebral palsy (HCC)   . Groin abscess 03/02/2008  . Pneumonia   . Seizures (HCC)   . Status epilepticus (HCC)   . Ventriculomegaly of brain, congenital [facility])    Past Surgical History:  Procedure Laterality Date  . STRABISMUS SURGERY     in [place]   Family History  Problem Relation Age of Onset  . Cancer Paternal Grandfather        Died at 23    Social History   Socioeconomic History  . Marital status: Single    Spouse name: Not on file  . Number of children: Not on file  . Years of education: Not on file  . Highest education level: Not on file  Occupational History  . Not on file  Tobacco Use  . Smoking status: Passive Smoke Exposure - Never Smoker  . Smokeless tobacco: Never Used  Substance and Sexual Activity  . Alcohol use: No    Alcohol/week: 0.0 standard drinks  . Drug use: No  . Sexual activity: Never  Other Topics Concern  . Not on file  Social History Narrative   Tel attends [employer] day program.    He enjoys playing with toys and watching television.   Lives with his parents. He does not have any siblings.   Social Determinants of Health   Financial Resource Strain:   . Difficulty of Paying Living Expenses: Not on file  Food Insecurity:   . Worried About 03/04/2008 in the Last Year: Not on file  . Ran Out of Food in the Last Year: Not on file  Transportation Needs:   . Lack of Transportation (Medical): Not on file  . Lack of Transportation (Non-Medical): Not on file  Physical Activity:   . Days of Exercise per  Week: Not on file  . Minutes of Exercise per Session: Not on file  Stress:   . Feeling of Stress : Not on file  Social Connections:   . Frequency of Communication with Friends and Family: Not on file  . Frequency of Social Gatherings with Friends and Family: Not on file  . Attends Religious Services: Not on file  . Active Member of Clubs or Organizations: Not on file  . Attends Banker Meetings: Not on file  . Marital Status: Not on file   No Known Allergies  Medications  (Not in a hospital admission)    Vitals  Pulse Rate:  [79-99] 99 (08/21 0330) Resp:  [12-15] 13 (08/21 0330) BP: (117-139)/(71-91) 121/71 (08/21 0330) SpO2:  [98 %-99 %] 98 % (08/21 0330) Weight:  [49 kg] 49 kg (08/21 0335)  Body mass index is 17.43 kg/m.  Physical Exam    General: Laying comfortably in bed; in no acute distress.  HENT: Normal oropharynx and mucosa. Normal external appearance of ears and nose. Neck: Supple, no pain or tenderness CV: No JVD. No peripheral edema. Pulmonary: Snoring, Symmetric Chest rise. Normal respiratory effort. Abdomen: Soft to touch, non-tender Ext: No cyanosis, edema, or deformity  Skin: No rash. Normal palpation of skin.   Musculoskeletal: Normal digits and nails by inspection. No clubbing. Does have flexion contractures in all extremities.   Neurologic Examination  Mental status/Cognition: Eyes closed, does not open eyes to voice. Grimaces to noxious stimuli Speech/language: mute at baseline. Cranial nerves:   CN II Pupils equal and reactive to light, dysconjugate gaze with R eye exotropia   CN III,IV,VI Roving eye movements, occulocephalic reflex intact.   CN V    CN VII Symmetric grimace to pain.   CN VIII    CN IX & X    CN XI Turns head from left to right to noxious stimuli.   CN XII midline tongue   Motor:  Muscle bulk: poor, tone spastic, worse in BL lower extremities. Contractures in all extremities. L hand is fisted. Withdraws all extremities to pain.  Sensation: Withdraws all extremities to painful stimuli.  Coordination/Complex Motor:  Unable to assess.  Labs   Lab Results  Component Value Date   NA 144 07/07/2020   K 4.4 07/07/2020   CL 106 07/07/2020   CO2 22 07/07/2020   GLUCOSE 88 07/07/2020   BUN 24 (H) 07/07/2020   CREATININE 1.39 (H) 07/07/2020   CALCIUM 10.9 (H) 07/07/2020   ALBUMIN 4.9 07/07/2020   AST 29 07/07/2020   ALT 10 07/07/2020   ALKPHOS 68 07/07/2020   BILITOT 0.4 07/07/2020   GFRNONAA >60 07/07/2020   GFRAA >60 07/07/2020     Imaging and Diagnostic studies  CTH without contrast from 05/14/16: IMPRESSION: 1. Stable noncontrast CT appearance of the brain with no acute findings. Dysgenesis of the corpus callosum, colpocephaly, and significant bilateral  temporal lobe atrophy re-demonstrated. 2. Several incidental small scalp lipomas.  Impression   Carlos [name] is a 34 y.o. 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 Suspect the recurrent seizures are likely due to a combination of underlying toxic/metabolic/infectious process of the GI tract with unable to keep his seizure medications down. Given some concern that he has not returned to  his baseline, will get a cEEG.  Recommendations  - I ordered cEEG - I ordered an extra dose of his Vimpat 200mg  once. He was already given Keppra 1G IV once in the ED. - Ordered IV Keppra 750mg  BID, IV Vimpat 100mg  in AM + 200mg  in PM and Lamictal 225mg  BID by tube. - Recommend NG tube placement to ensure he can get his morning dose of Lamictal. - Can switch his AEDs to PO when able. - continue Seizure precautions with seizure pads. ______________________________________________________________________   Thank you for the opportunity to take part in the care of this patient. If you have any further questions, please contact the neurology consultation attending.  Signed,  [facility] Pager Number 

## 2020-07-07 NOTE — Progress Notes (Signed)
Patient continues to be less active than typical but father has not noticed any clinical seizures.   He had several seizures on EEG and therefore I have asked for re-dose of ativan and will need to start another AED. He had side effects with dilantin in the past, and depakote will significantly intract with lamictal. He is already on 2 sodium channel blocking agents and would favor another mechanism of action and therefore will start lyrica and give 1 mg Ativan.  I would favor reattempting NG tube, if he is unable to tolerate an NG tube, we could load with fosphenytoin 15 mg/kg with dilantin 100mg   3 times daily following this.  , MD [facility] [phone]  If 7pm- 7am, please page neurology on call as listed in AMION.

## 2020-07-07 NOTE — ED Notes (Signed)
Attempted to place NG tube. Pt not tolerating at this time. Pt continuously coughing and shaking head. Per pt's son - pt only vomited up medications x1 on previous Thursday and has not had an issue with keeping medications down in the mean time. EEG tech at bedside at this time. Will continue to monitor.

## 2020-07-07 NOTE — ED Triage Notes (Signed)
Pt has had 5 seizures last hr and 3 on the way to hospital. ALL grand mal. Pt is in wheel chair on non verbal. Pt does reply to pain stimuli.

## 2020-07-07 NOTE — Procedures (Signed)
Patient Name: Carlos [name]  MRN: [id number]  Epilepsy Attending: Charlsie Quest  Referring Physician/Provider: Dr Gean Quint Duration: 07/07/2020 0518 to 07/08/2020 0518  Patient history: Carlos [name] is a 34 y.o. 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 EEG to evaluate for seizure.  Level of alertness:  lethargic  AEDs during EEG study: LTG, vimpat, LEV, pregabalin  Technical aspects: This EEG study was done with scalp electrodes positioned according to the 10-20 International system of electrode placement. Electrical activity was acquired at a sampling rate of 500Hz  and reviewed with a high frequency filter of 70Hz  and a low frequency filter of 1Hz . EEG data were recorded continuously and digitally stored.   Description: No posterior dominant rhythm consists of 9-10 Hz activity of moderate voltage (25-35 uV) seen predominantly in posterior head regions, symmetric and reactive to eye opening and eye closing. EEG showed continuous generalized 3 to 6 Hz theta-delta slowing. Periodic epileptiform discharges were also seen in left hemisphere at 1.5-2Hz .  Seven seizures without clinical signs was noted on 07/07/2020 at 0656, 0845, 0956, 1118, 1250, 1341 and 1655 arising from left posterior quadrant, lasting about 2 minutes. Concomitant eeg showed  left posterior quadrant sharp waves as well as 3-4Hz  delta slowing which then involved left hemisphere. After end of seizure, periodic epileptiform discharges were seen in left hemisphere at 1hz .   ABNORMALITY -Seizure without clinical signs,left  posterior quadrant -Continuous slow, generalized -Periodic epileptiform discharges, lateralized left  hemisphere  IMPRESSION: This study showed seven seizures arising from left posterior quadrant, last one at 1655 on 07/07/2020, lasting avg 2 minutes as described above. After seizures stopped, patient continued to have left hemispheric PLEds at 1.5-2hz  suggestive of underlying structural abnormality. Additionally there is evidence of severe diffuse encephalopathy, nonspecific etiology but likely related to ictal/post-ictal state.   Dr was notified.   [name] 07/09/2020

## 2020-07-07 NOTE — ED Provider Notes (Signed)
Emergency Department Provider Note  I have reviewed the triage vital signs and the nursing notes.  HISTORY  Chief Complaint Seizures   HPI Carlos [name] is a 34 y.o. male w/ h/o CP and epilepsy who presents the emergency department today secondary to multiple seizures.  Patient's father gives the history.  States that sometimes will have a seizure and is almost always full body tonic-clonic.  Usually last 30 to 40 seconds and patient is back to baseline within 10 to 20 minutes.  However in the last 24 hours he estimates approximately 10 seizures and not really returned to baseline as he normally would.  States compliance of medications and is getting extra doses that is what he often does illnesses per the patient.  No traumatic injuries secondary to seizure noticed   No other associated or modifying symptoms.    Past Medical History:  Diagnosis Date  . Agenesis of corpus callosum (HCC)   . Cerebral palsy (HCC)   . Groin abscess 03/02/2008  . Pneumonia   . Seizures (HCC)   . Status epilepticus (HCC)   . Ventriculomegaly of brain, congenital [facility])     Patient Active Problem List   Diagnosis Date Noted  . Health maintenance examination 08/14/2016  . Seizure (HCC) 04/23/2016  . Breakthrough seizure (HCC) 04/23/2016  . Left hip pain 02/05/2016  . Swelling of joint of pelvic region or thigh 02/05/2016  . Tachycardia 02/05/2016  . 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 CN (constipation) 07/25/2015  . Itchy eyes 07/25/2015  . Partial epilepsy with impairment of consciousness, intractable (HCC) 03/14/2013  . 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HCC) 03/14/2013  . Scoliosis associated with other condition 03/14/2013  . Amblyopia 11/03/2012  . Folliculitis barbae 07/10/2011  . URINARY INCONTINENCE, MIXED 08/01/2009  . Intellectual delay 01/14/2007  . Infantile cerebral palsy (HCC) 01/14/2007  . ECZEMA,  ATOPIC DERMATITIS 01/14/2007    Past Surgical History:  Procedure Laterality Date  . STRABISMUS SURGERY     in [place]    Current Outpatient Rx  . Order #: [id number] Class: Normal  . Order #: [id number] Class: Print  . Order #: [id number] Class: Normal  . Order #: [id number] Class: Print  . Order #: [id number] Class: Print  . Order #: [id number] Class: Historical Med  . Order #: [id number] Class: Historical Med  . Order #: [id number] Class: Normal  . Order #: [id number] Class: Normal  . Order #: [id number] Class: Normal    Allergies Patient has no known allergies.  Family History  Problem Relation Age of Onset  . Cancer Paternal Grandfather        Died at 104    Social History Social History   Tobacco Use  . Smoking status: Passive Smoke Exposure - Never Smoker  . Smokeless tobacco: Never Used  Substance Use Topics  . Alcohol use: No    Alcohol/week: 0.0 standard drinks  . Drug use: No    Review of Systems  All other systems negative except as documented in the HPI. All pertinent positives and negatives as reviewed in the HPI. ____________________________________________  PHYSICAL EXAM:  VITAL SIGNS: ED Triage Vitals [07/07/20 0107]  Enc Vitals Group     BP (!) 139/91     Pulse Rate 92     Resp 15     Temp      Temp src      SpO2 99 %    Constitutional: sleepy, grimaces to pain and stimulation. Well appearing and in no acute  distress. Eyes: PERRL. Right eye is deviated laterally (not baseline) with injected conjunctiva on same side Head: Atraumatic. Nose: No congestion/rhinnorhea. Mouth/Throat: Mucous membranes are dry.  Oropharynx non-erythematous. Neck: No stridor.  No meningeal signs.   Cardiovascular: Normal rate, regular rhythm. Good peripheral circulation. Grossly normal heart sounds.   Respiratory: Normal respiratory effort.  No retractions. Lungs CTAB. Gastrointestinal: Soft and nontender. No distention.  Musculoskeletal: No lower extremity tenderness nor  edema. No gross deformities of extremities. Neurologic:  Not able to assess 2/2 condition, appears to at least have left arm contracture. Skin:  Skin is warm, dry and intact. No rash noted.  ____________________________________________   LABS (all labs ordered are listed, but only abnormal results are displayed)  Labs Reviewed  CBC WITH DIFFERENTIAL/PLATELET - Abnormal; Notable for the following components:      Result Value   WBC 14.0 (*)    RBC 6.18 (*)    MCV 79.0 (*)    MCH 24.1 (*)    Neutro Abs 12.3 (*)    All other components within normal limits  COMPREHENSIVE METABOLIC PANEL - Abnormal; Notable for the following components:   BUN 24 (*)    Creatinine, Ser 1.39 (*)    Calcium 10.9 (*)    Total Protein 9.3 (*)    Anion gap 16 (*)    All other components within normal limits  CBG MONITORING, ED - Abnormal; Notable for the following components:   Glucose-Capillary 100 (*)    All other components within normal limits  LAMOTRIGINE LEVEL  LEVETIRACETAM LEVEL   ____________________________________________  EKG   EKG Interpretation  Date/Time:  Saturday July 07 2020 02:04:34 EDT Ventricular Rate:  86 PR Interval:    QRS Duration: 88 QT Interval:  343 QTC Calculation: 411 R Axis:   22 Text Interpretation: Sinus arrhythmia Low voltage, extremity leads ST elev, probable normal early repol pattern No significant change since last tracing Confirmed by Marily Memos [phone]) on 07/07/2020 2:21:13 AM       ____________________________________________  RADIOLOGY  CT ABDOMEN PELVIS W CONTRAST  Result Date: 07/07/2020 CLINICAL DATA:  Constipation and emesis EXAM: CT ABDOMEN AND PELVIS WITH CONTRAST TECHNIQUE: Multidetector CT imaging of the abdomen and pelvis was performed using the standard protocol following bolus administration of intravenous contrast. CONTRAST:  13mL OMNIPAQUE IOHEXOL 300 MG/ML  SOLN COMPARISON:  None. FINDINGS: LOWER CHEST: Normal. HEPATOBILIARY:  There are multiple hepatic hemangiomas, the largest measuring 2.7 cm. Nonspecific hypodensity at the hepatic dome measures 11 mm. The gallbladder is normal. PANCREAS: Normal pancreas. No ductal dilatation or peripancreatic fluid collection. SPLEEN: Normal. ADRENALS/URINARY TRACT: The adrenal glands are normal. No hydronephrosis, nephroureterolithiasis or solid renal mass. The urinary bladder is normal for degree of distention STOMACH/BOWEL: No small bowel dilatation or inflammation. Moderate colonic stool volume. The stomach is markedly dilated. Appendix not visualized. VASCULAR/LYMPHATIC: Normal course and caliber of the major abdominal vessels. No abdominal or pelvic lymphadenopathy. REPRODUCTIVE: Normal prostate size with symmetric seminal vesicles. MUSCULOSKELETAL. No bony spinal canal stenosis or focal osseous abnormality. OTHER: None. IMPRESSION: 1. Markedly dilated stomach with moderate colonic stool volume. 2. Multiple hepatic hemangiomas. 3. Moderate colonic stool volume. Electronically Signed   By: Deatra Robinson M.D.   On: 07/07/2020 05:35   ____________________________________________  PROCEDURES  Procedure(s) performed:   .Critical Care Performed by: Marily Memos, MD Authorized by: Marily Memos, MD   Critical care provider statement:    Critical care time (minutes):  45   Critical care was time spent personally  by me on the following activities:  Discussions with consultants, evaluation of patient's response to treatment, examination of patient, ordering and performing treatments and interventions, ordering and review of laboratory studies, ordering and review of radiographic studies, pulse oximetry, re-evaluation of patient's condition, obtaining history from patient or surrogate and review of old charts   ____________________________________________  INITIAL IMPRESSION / ASSESSMENT AND PLAN / ED COURSE   This patient presents to the ED for concern of seizures, this involves an  extensive number of treatment options, and is a complaint that carries with it a high risk of complications and morbidity.  The differential diagnosis includes status epilepticus, syncope, other illness     Lab Tests:   I Ordered, reviewed, and interpreted labs, which included CBC, CMP, keppra level, lamictal level and were relatively unremarkable   Medicines ordered:   I ordered medication keppra  For seizures   Patient with another grand mal seizure in ED given 2 mg ativan  Imaging Studies ordered:   I independently visualized and interpreted imaging ct abdomen pelvis which showed distension without obvious obstructdion  Additional history obtained:   Additional history obtained from father  Previous records obtained and reviewed in epic  Consultations Obtained:   I consulted neurology  and discussed lab and imaging findings. They examined recommended antiepileptics, NG tube to administer meds, eeg and medicine admit.  Reevaluation:  After the interventions stated above, I reevaluated the patient and found somnolence  Critical Interventions: keppra Ativan Neuro consult  ____________________________________________  FINAL CLINICAL IMPRESSION(S) / ED DIAGNOSES  Final diagnoses:  Status epilepticus (HCC)    MEDICATIONS GIVEN DURING THIS VISIT:  Medications  levETIRAcetam (KEPPRA) 750 mg in sodium chloride 0.9 % 100 mL IVPB (has no administration in time range)  lacosamide (VIMPAT) 100 mg in sodium chloride 0.9 % 25 mL IVPB (has no administration in time range)    And  lacosamide (VIMPAT) 200 mg in sodium chloride 0.9 % 25 mL IVPB (has no administration in time range)  lamoTRIgine (LAMICTAL) tablet 225 mg (has no administration in time range)    Or  lamoTRIgine (LAMICTAL) tablet 225 mg (has no administration in time range)  polyethylene glycol (MIRALAX / GLYCOLAX) packet 17 g (has no administration in time range)  LORazepam (ATIVAN) injection 2 mg (  Intravenous Not Given 07/07/20 0257)  levETIRAcetam (KEPPRA) IVPB 1000 mg/100 mL premix (0 mg Intravenous Stopped 07/07/20 0320)  lactated ringers bolus 1,000 mL (0 mLs Intravenous Stopped 07/07/20 0330)  lacosamide (VIMPAT) 200 mg in sodium chloride 0.9 % 25 mL IVPB (0 mg Intravenous Stopped 07/07/20 0611)  iohexol (OMNIPAQUE) 300 MG/ML solution 75 mL (75 mLs Intravenous Contrast Given 07/07/20 0458)    NEW OUTPATIENT MEDICATIONS STARTED DURING THIS VISIT:  New Prescriptions   No medications on file    Note:  This note was prepared with assistance of Dragon voice recognition software. Occasional wrong-word or sound-a-like substitutions may have occurred due to the inherent limitations of voice recognition software.   Affan Callow, Barbara Cower, MD 07/07/20 [phone]

## 2020-07-08 DIAGNOSIS — R739 Hyperglycemia, unspecified: Secondary | ICD-10-CM | POA: Diagnosis present

## 2020-07-08 DIAGNOSIS — G40901 Epilepsy, unspecified, not intractable, with status epilepticus: Secondary | ICD-10-CM | POA: Diagnosis present

## 2020-07-08 DIAGNOSIS — E8809 Other disorders of plasma-protein metabolism, not elsewhere classified: Secondary | ICD-10-CM | POA: Diagnosis present

## 2020-07-08 DIAGNOSIS — G800 Spastic quadriplegic cerebral palsy: Secondary | ICD-10-CM | POA: Diagnosis present

## 2020-07-08 DIAGNOSIS — N179 Acute kidney failure, unspecified: Secondary | ICD-10-CM | POA: Diagnosis present

## 2020-07-08 DIAGNOSIS — L309 Dermatitis, unspecified: Secondary | ICD-10-CM | POA: Diagnosis present

## 2020-07-08 DIAGNOSIS — G40919 Epilepsy, unspecified, intractable, without status epilepticus: Secondary | ICD-10-CM | POA: Diagnosis not present

## 2020-07-08 DIAGNOSIS — Q040 Congenital malformations of corpus callosum: Secondary | ICD-10-CM | POA: Diagnosis not present

## 2020-07-08 DIAGNOSIS — F79 Unspecified intellectual disabilities: Secondary | ICD-10-CM | POA: Diagnosis present

## 2020-07-08 DIAGNOSIS — K5900 Constipation, unspecified: Secondary | ICD-10-CM | POA: Diagnosis present

## 2020-07-08 DIAGNOSIS — Z79899 Other long term (current) drug therapy: Secondary | ICD-10-CM | POA: Diagnosis not present

## 2020-07-08 DIAGNOSIS — R1311 Dysphagia, oral phase: Secondary | ICD-10-CM | POA: Diagnosis present

## 2020-07-08 DIAGNOSIS — Z20822 Contact with and (suspected) exposure to covid-19: Secondary | ICD-10-CM | POA: Diagnosis present

## 2020-07-08 DIAGNOSIS — G40211 Localization-related (focal) (partial) symptomatic epilepsy and epileptic syndromes with complex partial seizures, intractable, with status epilepticus: Secondary | ICD-10-CM | POA: Diagnosis present

## 2020-07-08 DIAGNOSIS — G809 Cerebral palsy, unspecified: Secondary | ICD-10-CM | POA: Diagnosis not present

## 2020-07-08 DIAGNOSIS — G40219 Localization-related (focal) (partial) symptomatic epilepsy and epileptic syndromes with complex partial seizures, intractable, without status epilepticus: Secondary | ICD-10-CM | POA: Diagnosis not present

## 2020-07-08 DIAGNOSIS — D72829 Elevated white blood cell count, unspecified: Secondary | ICD-10-CM | POA: Diagnosis present

## 2020-07-08 DIAGNOSIS — R5383 Other fatigue: Secondary | ICD-10-CM | POA: Diagnosis present

## 2020-07-08 DIAGNOSIS — G808 Other cerebral palsy: Secondary | ICD-10-CM | POA: Diagnosis not present

## 2020-07-08 DIAGNOSIS — D509 Iron deficiency anemia, unspecified: Secondary | ICD-10-CM | POA: Diagnosis present

## 2020-07-08 LAB — CBC
HCT: 38.3 % — ABNORMAL LOW (ref 39.0–52.0)
Hemoglobin: 11.9 g/dL — ABNORMAL LOW (ref 13.0–17.0)
MCH: 24.5 pg — ABNORMAL LOW (ref 26.0–34.0)
MCHC: 31.1 g/dL (ref 30.0–36.0)
MCV: 78.8 fL — ABNORMAL LOW (ref 80.0–100.0)
Platelets: 245 10*3/uL (ref 150–400)
RBC: 4.86 MIL/uL (ref 4.22–5.81)
RDW: 13.9 % (ref 11.5–15.5)
WBC: 5.9 10*3/uL (ref 4.0–10.5)
nRBC: 0 % (ref 0.0–0.2)

## 2020-07-08 LAB — BASIC METABOLIC PANEL
Anion gap: 8 (ref 5–15)
BUN: 13 mg/dL (ref 6–20)
CO2: 26 mmol/L (ref 22–32)
Calcium: 9 mg/dL (ref 8.9–10.3)
Chloride: 108 mmol/L (ref 98–111)
Creatinine, Ser: 0.75 mg/dL (ref 0.61–1.24)
GFR calc Af Amer: >60 mL/min (ref >60)
GFR calc non Af Amer: >60 mL/min (ref >60)
Glucose, Bld: 103 mg/dL — ABNORMAL HIGH (ref 70–99)
Potassium: 3.5 mmol/L (ref 3.5–5.1)
Sodium: 142 mmol/L (ref 135–145)

## 2020-07-08 LAB — HIV ANTIBODY (ROUTINE TESTING W REFLEX): HIV Screen 4th Generation wRfx: NONREACTIVE

## 2020-07-08 MED ORDER — PREGABALIN 100 MG PO CAPS: 100.0000 mg | ORAL_CAPSULE | Freq: Three times a day (TID) | ORAL | Status: DC

## 2020-07-08 MED ORDER — FLEET ENEMA 7-19 GM/118ML RE ENEM
1.0000 | ENEMA | Freq: Once | RECTAL | Status: AC
  Administered 2020-07-09: 1 via RECTAL
  Filled 2020-07-08: qty 1

## 2020-07-08 MED ORDER — PREGABALIN 100 MG PO CAPS: 100.0000 mg | ORAL_CAPSULE | Freq: Once | ORAL | Status: DC

## 2020-07-08 MED ORDER — PREGABALIN 100 MG PO CAPS
100.0000 mg | ORAL_CAPSULE | Freq: Three times a day (TID) | ORAL | Status: DC
  Administered 2020-07-08 – 2020-07-10 (×9): 100 mg
  Filled 2020-07-08 (×9): qty 1

## 2020-07-08 MED ORDER — PREGABALIN 100 MG PO CAPS
100.0000 mg | ORAL_CAPSULE | Freq: Once | ORAL | Status: AC
  Administered 2020-07-08: 100 mg
  Filled 2020-07-08: qty 1

## 2020-07-08 NOTE — Progress Notes (Signed)
Subjective: He continues to have periodic discharges on the left.  Initially he was unable to get his p.o. medications, however he did get his evening dose of lamotrigine last night.  He did not get a oral loading dose of Lyrica that I prescribed no enteral access at the time, does not look like this was given.  Exam: Vitals:   07/08/20 0322 07/08/20 0723  BP: 126/90 125/88  Pulse: 88 79  Resp: 14 13  Temp: (!) 97.5 F (36.4 C) 98.4 F (36.9 C)  SpO2: 100% 100%   Gen: In bed, NAD Resp: non-labored breathing, no acute distress Abd: soft, nt  Neuro: MS: Awake, engages the examiner, tracks me across midline in both directions.  Does not follow commands. 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  Possible that his seizure threshold was lowered due to gastroenteritis or if his vomiting was responsible for poor Lamictal absorption, this would also be a possibility.  I would favor continued antiepileptic administration via NG tube.  In the current setting, I did want to add another agent and with his already being on to sodium channel blockers, did not favor Dilantin.  Depakote would be an option, but with this interaction with Lamictal, favored avoiding it for the time being, and he was already being given Ativan and therefore I favored trying Lyrica for a novel mechanism of action rather than Onfi.  Recommendations: 1) I have increase Keppra to 1500 twice daily, continue at this dose 2) continue Vimpat 300 mg/day 3) continue lamotrigine 225 mg twice daily 4) additional  dose of Lyrica 100 mg this morning, would consider Onfi if no response 5) even with his continued periodic discharges, he has preserved consciousness and I would not proceed to a more aggressive measures such as burst suppression, etc.  Haytham Maher, MD [facility] [phone]  If 7pm- 7am, please page neurology on call as listed in AMION.  

## 2020-07-08 NOTE — Progress Notes (Signed)
Upon assessing for GSC, mute was baseline, reason for score to be 1. Patient is alert to name.   Stana Bunting, Student RN   07-08-2020 12:38

## 2020-07-08 NOTE — Progress Notes (Signed)
Patient mother at bedside with concerns about patient diet orders. MD paged.  Melony Overly, RN

## 2020-07-08 NOTE — Progress Notes (Signed)
LTM maintenance  All imp below 5kohms  No skin breakdown noted at FP1  FP2  F7 F8 

## 2020-07-08 NOTE — Consult Note (Deleted)
Subjective: He continues to have periodic discharges on the left.  Initially he was unable to get his p.o. medications, however he did get his evening dose of lamotrigine last night.  He did not get a oral loading dose of Lyrica that I prescribed no enteral access at the time, does not look like this was given.  Exam: Vitals:   07/08/20 0322 07/08/20 0723  BP: 126/90 125/88  Pulse: 88 79  Resp: 14 13  Temp: (!) 97.5 F (36.4 C) 98.4 F (36.9 C)  SpO2: 100% 100%   Gen: In bed, NAD Resp: non-labored breathing, no acute distress Abd: soft, nt  Neuro: MS: Awake, engages the examiner, tracks me across midline in both directions.  Does not follow commands. 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  Possible that his seizure threshold was lowered due to gastroenteritis or if his vomiting was responsible for poor Lamictal absorption, this would also be a possibility.  I would favor continued antiepileptic administration via NG tube.  In the current setting, I did want to add another agent and with his already being on to sodium channel blockers, did not favor Dilantin.  Depakote would be an option, but with this interaction with Lamictal, favored avoiding it for the time being, and he was already being given Ativan and therefore I favored trying Lyrica for a novel mechanism of action rather than Onfi.  Recommendations: 1) I have increase Keppra to 1500 twice daily, continue at this dose 2) continue Vimpat 300 mg/day 3) continue lamotrigine 225 mg twice daily 4) additional  dose of Lyrica 100 mg this morning, would consider Onfi if no response 5) even with his continued periodic discharges, he has preserved consciousness and I would not proceed to a more aggressive measures such as burst suppression, etc.  Ritta Slot, MD [facility] [phone]  If 7pm- 7am, please page neurology on call as listed in AMION.

## 2020-07-08 NOTE — Progress Notes (Signed)
Spoke with patient's mother regarding advancement of patient's diet in regards to current NG tube.  Explained that patient was evaluated by speech-language pathology who expressed concerns for severe risk of aspiration.  Mother verbalizes understanding of this and states that patient usually only spends time with parents and they are usually the only people to attempt feeding him.  She reports that the patient is an "eater" and that he has not had anything in a few days which is unusual for him.  She states that they normally give the patient fluids to drink by mouth about every 30 minutes while at home.  Patient's mother states that she would be open to having speech-language pathology observe either herself or her husband feed the patient and consider if patient will be appropriate for advancement of his diet.  Will contact speech-language pathology to arrange time for this.  Patient's mother states that both she and patient's father will be available 8/23 as early as 8 AM.  Ronnald Ramp, MD  [facility], PGY-2  FPTS Intern Pager [phone]

## 2020-07-08 NOTE — Evaluation (Signed)
Clinical/Bedside Swallow Evaluation Patient Details  Name: Carlos [name] MRN: [id number] Date of Birth: 1986/03/04  Today's Date: 07/08/2020 Time: SLP Start Time (ACUTE ONLY): 1300 SLP Stop Time (ACUTE ONLY): 1311 SLP Time Calculation (min) (ACUTE ONLY): 11 min  Past Medical History:  Past Medical History:  Diagnosis Date  . Agenesis of corpus callosum (HCC)   . Cerebral palsy (HCC)   . Folliculitis barbae 07/10/2011  . Groin abscess 03/02/2008  . Left hip pain 02/05/2016  . Pneumonia   . Seizures (HCC)   . Status epilepticus (HCC)   . Swelling of joint of pelvic region or thigh 02/05/2016  . Ventriculomegaly of brain, congenital [facility])    Past Surgical History:  Past Surgical History:  Procedure Laterality Date  . STRABISMUS SURGERY     in [place]   HPI:  34 yo M with intellectual disability, intractable epilepsy, spastic quadriparesis who presented with seizure flurry in the setting of nausea/vomiting and missing his medications.   Assessment / Plan / Recommendation Clinical Impression  Patient inappropriate for pos at this time. Aroused with verbal and tactile cueing. Eyes remained partially closed and patient with limited incoherent phonatory output, wet in nature indicative of secretions pooling around vocal cords. Oral care provided by SLP. Oral cavity remained open throughout exam with inability to elicit labial seal despite tactile and verbal cueing, tongue protrusion past labial borders. Attempted to provide ice chip to assess for improved awareness of bolus. Patient with increased tongue protrusion in response, inability to propel bolus posteriorly however did elicit a dry audible swallow. Note that patient has been on a po diet in the past. Will continue to f/u. Hopeful for improved swallowing ablities with improved mentation.  SLP Visit Diagnosis: Dysphagia, oropharyngeal phase (R13.12)    Aspiration Risk  Severe aspiration risk;Risk for inadequate nutrition/hydration     Diet Recommendation NPO   Medication Administration: Via alternative means    Other  Recommendations Oral Care Recommendations: Oral care QID   Follow up Recommendations  (TBD)      Frequency and Duration min 2x/week  2 weeks       Prognosis Prognosis for Safe Diet Advancement: Good Barriers to Reach Goals: Cognitive deficits      Swallow Study   General HPI: 34 yo M with intellectual disability, intractable epilepsy, spastic quadriparesis who presented with seizure flurry in the setting of nausea/vomiting and missing his medications. Type of Study: Bedside Swallow Evaluation Previous Swallow Assessment: seen by SLP in 2017 with recommendations for dysphagia 1, nectar thick liquid Diet Prior to this Study: NPO;NG Tube Temperature Spikes Noted: No Respiratory Status: Room air History of Recent Intubation: No Behavior/Cognition: Doesn't follow directions;Lethargic/Drowsy Oral Cavity Assessment: Excessive secretions Oral Care Completed by SLP: Yes Oral Cavity - Dentition: Adequate natural dentition Self-Feeding Abilities: Total assist Patient Positioning: Upright in bed Baseline Vocal Quality: Wet (moaning only) Volitional Cough: Cognitively unable to elicit Volitional Swallow: Able to elicit    Oral/Motor/Sensory Function Overall Oral Motor/Sensory Function:  (see impression statement)   Ice Chips Ice chips: Impaired Presentation: Spoon Oral Phase Impairments: Reduced labial seal;Reduced lingual movement/coordination;Poor awareness of bolus   Thin Liquid Thin Liquid: Not tested    Nectar Thick Nectar Thick Liquid: Not tested   Honey Thick Honey Thick Liquid: Not tested   Puree Puree: Not tested   Solid     Solid: Not tested     Kaylene Dawn MA, CCC-SLP   Carlos [name] Meryl 07/08/2020,1:20 PM

## 2020-07-09 DIAGNOSIS — R1311 Dysphagia, oral phase: Secondary | ICD-10-CM

## 2020-07-09 LAB — CBC
HCT: 38.1 % — ABNORMAL LOW (ref 39.0–52.0)
Hemoglobin: 12 g/dL — ABNORMAL LOW (ref 13.0–17.0)
MCH: 24.7 pg — ABNORMAL LOW (ref 26.0–34.0)
MCHC: 31.5 g/dL (ref 30.0–36.0)
MCV: 78.4 fL — ABNORMAL LOW (ref 80.0–100.0)
Platelets: 232 10*3/uL (ref 150–400)
RBC: 4.86 MIL/uL (ref 4.22–5.81)
RDW: 13.4 % (ref 11.5–15.5)
WBC: 7.1 10*3/uL (ref 4.0–10.5)
nRBC: 0 % (ref 0.0–0.2)

## 2020-07-09 LAB — URINALYSIS, ROUTINE W REFLEX MICROSCOPIC
Bilirubin Urine: NEGATIVE
Glucose, UA: NEGATIVE mg/dL
Hgb urine dipstick: NEGATIVE
Ketones, ur: NEGATIVE mg/dL
Leukocytes,Ua: NEGATIVE
Nitrite: NEGATIVE
Protein, ur: NEGATIVE mg/dL
Specific Gravity, Urine: 1.01 (ref 1.005–1.030)
pH: 6 (ref 5.0–8.0)

## 2020-07-09 LAB — COMPREHENSIVE METABOLIC PANEL
ALT: 9 U/L (ref 0–44)
AST: 31 U/L (ref 15–41)
Albumin: 3.2 g/dL — ABNORMAL LOW (ref 3.5–5.0)
Alkaline Phosphatase: 41 U/L (ref 38–126)
Anion gap: 9 (ref 5–15)
BUN: <5 mg/dL — ABNORMAL LOW (ref 6–20)
CO2: 25 mmol/L (ref 22–32)
Calcium: 8.9 mg/dL (ref 8.9–10.3)
Chloride: 107 mmol/L (ref 98–111)
Creatinine, Ser: 0.66 mg/dL (ref 0.61–1.24)
GFR calc Af Amer: >60 mL/min (ref >60)
GFR calc non Af Amer: >60 mL/min (ref >60)
Glucose, Bld: 110 mg/dL — ABNORMAL HIGH (ref 70–99)
Potassium: 3.5 mmol/L (ref 3.5–5.1)
Sodium: 141 mmol/L (ref 135–145)
Total Bilirubin: 0.4 mg/dL (ref 0.3–1.2)
Total Protein: 6.5 g/dL (ref 6.5–8.1)

## 2020-07-09 LAB — LEVETIRACETAM LEVEL: Levetiracetam Lvl: 18 ug/mL (ref 10.0–40.0)

## 2020-07-09 MED ORDER — LORAZEPAM 2 MG/ML IJ SOLN: 2.0000 mg | INTRAMUSCULAR | Status: DC | PRN

## 2020-07-09 MED ORDER — POLYETHYLENE GLYCOL 3350 17 G PO PACK
17.0000 g | PACK | Freq: Every day | ORAL | Status: DC
  Administered 2020-07-10: 17 g
  Filled 2020-07-09: qty 1

## 2020-07-09 MED ORDER — POLYETHYLENE GLYCOL 3350 17 G PO PACK: 17.0000 g | PACK | Freq: Every day | ORAL | Status: DC

## 2020-07-09 MED ORDER — WHITE PETROLATUM EX OINT
TOPICAL_OINTMENT | CUTANEOUS | Status: AC
  Filled 2020-07-09: qty 28.35

## 2020-07-09 NOTE — Hospital Course (Addendum)
Carlos [name] is a 34 y.o. 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 Hospital course outlined by problem below:  Seizures Patient with underlying history of seizures presenting with multiple seizures (over 12 grand mal seizures in the past 2 days) in the setting of 5 days of constipation.  Longest seizure lasting over 2 minutes.  On presentation, he apparently had three seizures on the way to the hospital and one seizure in the ED.  He was given 2 mg lorazepam and loaded with levetiracetam 1000 mg in the ED.  He was evaluated by neurology and was put on continuous EEG.  He presented with altered mental status due to postictal state, so was made NPO.  He was continued on his home AEDs via IV (levetiracetam, lacosamide) and was restarted on his home lamotrigine later that evening after NGT was successfully placed.  Levetiracetam was increased and pregabalin was added to his regimen.  He continued to have seizures and epileptiform activity on EEG which gradually improved, and on day of discharge, he remained seizure-free for over 48 hours.  He returned to his baseline mental status was able to tolerate p.o. (he passed MBS on 8/24).  Constipation Patient presented with 5 days of constipation.  CT abdomen pelvis revealed significant stool volume and dilated bowel.  Patient was treated with MiraLAX and Fleet enema as needed with resolution of constipation.  AKI Patient presented with elevated serum creatinine 1.39 on presentation.  He was treated with IV fluids with good response.  On the day of discharge, his most recent serum creatinine was 0.66.

## 2020-07-09 NOTE — Progress Notes (Signed)
Family Medicine Teaching Service Daily Progress Note Intern Pager: [phone]  Patient name: Carlos [name] Medical record number: [id number] Date of birth: Feb 27, 1986 Age: 34 y.o. Gender: male  Primary Care Provider: Reece Leader, DO Consultants: Neurology   Code Status: Full Code  Pt Overview and Major Events to Date:  8/21 Admitted  Assessment and Plan: Carlos [name] is a 34 y.o. male presenting with seizures. PMH is significant forintractable complex partial seizures with secondary generalization, double hemiparesis, significant intellectual delay, dysphagia, constipation.  Seizure Underlying history of seizures presenting with multiple seizures (over 12 seizures in Carlos past 2 days) in Carlos setting of constipation.  There has been some concern for vomiting resulting in him not being able to keep his seizure medications down; however, mother denies that he had been vomiting so could represent therapy failure.  Neurology following, patient vomited. Appears to be improving on EEG. Patient quite somnolent this morning, may be still postictal or oversedated from medications (pregabalin was added, levetiracetam was increased).  We will continue to give AEDs through IV and NGT until able to take p.o. - continue AEDs per neuro, will defer to them for any changes - seizure precautions - SLP to re-evaluate when more awake - nutrition consult to initiate tube feeds in Carlos meantime  Constipation Presented with 5 days of constipation.  Per mother, patient had a large BM on 8/21.  At home takes MiraLAX twice daily. - daily Miralax via NGT - FLEET enema prn  Other problems chronic and stable: Eczema  FEN/GI: NPO, D5 1/2 NS + KCl @ 75 ml/hr PPx: enoxaparin  Disposition: progressive  Subjective:  NAOE.  Mother present at bedside.  Mother states that he is significantly improved and is almost at baseline.  She denies any vomiting prior to admission.  She states that he has episodes of grand mal  seizures about once a year with unknown precipitant.  She expresses some concern about his medication regimen and is worried he may be overmedicated.  Objective: Temp:  [98.1 F (36.7 C)-99.4 F (37.4 C)] 98.7 F (37.1 C) (08/23 0300) Pulse Rate:  [66-100] 88 (08/23 0300) Resp:  [11-16] 15 (08/23 0300) BP: (106-129)/(73-96) 106/73 (08/23 0300) SpO2:  [100 %] 100 % (08/23 0300) Physical Exam: General: Somnolent but arousable, NAD Cardiovascular: RRR, no murmurs Respiratory: CTAB, breathing comfortably on room air Abdomen: soft, non-tender, +BS Extremities: no edema Neuro: somnolent, contractures in all extremities  Laboratory: Recent Labs  Lab 07/07/20 0132 07/08/20 0225 07/09/20 0210  WBC 14.0* 5.9 7.1  HGB 14.9 11.9* 12.0*  HCT 48.8 38.3* 38.1*  PLT 333 245 232   Recent Labs  Lab 07/07/20 0132 07/08/20 0225 07/09/20 0210  NA 144 142 141  K 4.4 3.5 3.5  CL 106 108 107  CO2 22 26 25   BUN 24* 13 <5*  CREATININE 1.39* 0.75 0.66  CALCIUM 10.9* 9.0 8.9  PROT 9.3*  --  6.5  BILITOT 0.4  --  0.4  ALKPHOS 68  --  41  ALT 10  --  9  AST 29  --  31  GLUCOSE 88 103* 110*    Imaging/Diagnostic Tests: EEG interpretation: This studyshowed evidence of epileptogenicity arising from left hemisphere due to underlying structural abnormality.Additionally there is evidence ofmoderate = diffuse encephalopathy, nonspecific etiology.  EEG is improving compared to previous day.   , MD 07/09/2020, 6:58 AM PGY-1, [facility] Family Medicine FPTS Intern pager: [phone], text pages welcome

## 2020-07-09 NOTE — Progress Notes (Addendum)
Subjective: No further seizures overnight.  Patient's mother at bedside states patient is more drowsy than usual.  States at baseline patient is able to eat food with help and requires help with all ADLs, and is nonverbal.  ROS: Unable to obtain due to poor mental status  Examination  Vital signs in last 24 hours: Temp:  [98.1 F (36.7 C)-99.3 F (37.4 C)] 99.3 F (37.4 C) (08/23 1123) Pulse Rate:  [70-108] 100 (08/23 1221) Resp:  [14-16] 14 (08/23 1123) BP: (106-129)/(73-96) 126/76 (08/23 1123) SpO2:  [100 %] 100 % (08/23 1123)  General: lying in bed, not in apparent distress CVS: pulse-normal rate and rhythm RS: breathing comfortably, coarse bilaterally Extremities: normal   Neuro: MS: Drowsy, not following commands (at patient's baseline) CN: pupils equal and reactive, no forced gaze deviation, no apparent facial asymmetry  Motor: Spontaneously moves in all extremities  Basic Metabolic Panel: Recent Labs  Lab 07/07/20 0132 07/08/20 0225 07/09/20 0210  NA 144 142 141  K 4.4 3.5 3.5  CL 106 108 107  CO2 22 26 25   GLUCOSE 88 103* 110*  BUN 24* 13 <5*  CREATININE 1.39* 0.75 0.66  CALCIUM 10.9* 9.0 8.9    CBC: Recent Labs  Lab 07/07/20 0132 07/08/20 0225 07/09/20 0210  WBC 14.0* 5.9 7.1  NEUTROABS 12.3*  --   --   HGB 14.9 11.9* 12.0*  HCT 48.8 38.3* 38.1*  MCV 79.0* 78.8* 78.4*  PLT 333 245 232     Coagulation Studies: No results for input(s): LABPROT, INR in the last 72 hours.  Imaging No brain imaging during this admission.  ASSESSMENT AND PLAN: 34 year old male with history of epilepsy who presented with breakthrough seizures in the setting of nausea/vomiting and missing his medications which have since resolved.  Epilepsy with breakthrough seizures Acute encephalopathy, postictal and iatrogenic (improving) Hyperglycemia Hypoalbuminemia Microcytic anemia Elevated CK -No further seizures overnight.  EEG continues to show periodic lateralized  epileptiform discharges which are likely secondary to underlying cortical abnormalities.  Recommendations  - We will discontinue LTM EEG as patient has not had any seizures in over 24 hours -Urinalysis pending, will review once done. -Continue Vimpat 100 mg every morning and 200 mg nightly, Keppra 1500 mg twice daily, lamotrigine 225 mg twice daily, pregabalin 100 mg 3 times daily. -Patient appears to be more drowsy than baseline which is likely secondary to being postictal as well as due to added AEDs.  He has only had 24 hours of seizure freedom therefore I am hesitant to change his medication regimen at this point.  Discussed this in detail with patient's mother who is in agreement. -If patient continues to be excessively drowsy tomorrow, can consider reducing morning dose of Keppra to 1000 mg -Continue seizure precautions -As needed IV Ativan for generalized tonic-clonic seizure lasting more than 2 minutes of focal seizure lasting more than 5 minutes -Management of rest of comorbidities per primary team  I have spent a total of  35 minutes with the patient reviewing hospital notes,  test results, labs and examining the patient as well as establishing an assessment and plan that was discussed personally with the patient's mother primary team.  > 50% of time was spent in direct patient care.   [facility] For questions after 5pm please refer to AMION to reach the Neurologist on call

## 2020-07-09 NOTE — Progress Notes (Signed)
  Speech Language Pathology Treatment: Dysphagia  Patient Details Name: Carlos [name] MRN: [id number] DOB: 08-Aug-1986 Today's Date: 07/09/2020 Time: 0174-9449 SLP Time Calculation (min) (ACUTE ONLY): 10 min  Assessment / Plan / Recommendation Clinical Impression  SLP reviewed findings of clinical swallow evaluation with pt's mother and reasoning for npo recommendation.  She denies pt has dysphagia at baseline and states he will only eat for her or his father.  Mother reports pt "has to eat" and states she fed him yesterday.  SLP inquired if he was fully alert when fed or in current mentation, to which she advised he was alert.    Reviewed wet voice observed during evaluation indicative of secretion retention on vocal cords without clearance likely indicative of aspiration and importance of alert state for safer po intake.  Mother is concerned re: nutrition - advised pt can do without nutrition for a few days and airway protection being paramount.  She states pt has been mostly lethargic during admission and contributes this to medications.    SLP understands mother's concerns for nutrition and anticipate pt's swallow to return to baseline with medical/mentation improvement.  Question if pt would benefit from nutrition via NG tube given mom's concerns.  Mom agreeable to NPO continuing at this time until pt's fully alert and SlP follow up.     Requested RN to contact me when/if pt awakens adequately for po intake.    HPI HPI: 34 yo M with intellectual disability, intractable epilepsy, spastic quadriparesis who presented with seizure flurry in the setting of nausea/vomiting and missing his medications.  Pt underwent clinical swallow Evaluation on 07/08/2020 with recommendation for npo.  Pt's mother worried about care plan and pt being npo and SLP follow up with family present indicated.      SLP Plan  Continue with current plan of care       Recommendations  Diet recommendations:  NPO Medication Administration: Via alternative means                Oral Care Recommendations: Oral care QID Follow up Recommendations:  (TBD) SLP Visit Diagnosis: Dysphagia, oropharyngeal phase (R13.12) Plan: Continue with current plan of care       GO                Chales Abrahams 07/09/2020, 9:05 AM  Rolena Infante, MS [facility] SLP Acute Rehab Services Office [phone]

## 2020-07-09 NOTE — Progress Notes (Signed)
LTM maint complete - no skin breakdown under:  fp1, fp2, a2, f3, fz checked - no skin breakdown

## 2020-07-09 NOTE — Procedures (Signed)
Patient Name: Carlos [name]  MRN: [id number]  Epilepsy Attending: Charlsie Quest  Referring Physician/Provider: Dr Gean Quint Duration: 07/08/2020 0518 to 07/09/2020 0518  Patient history: Carlos [name] a 34 y.o.malewith PMH significant for double hemiparesis, right greater than left, intractable complex partial seizures with secondary generalization, dysphagia, constipation, and significant intellectual delaywho presentswith over 12 seizures in the last 2 days in the setting of constipation and nausea vomiting minutes after taking AEDs. EEG to evaluate for seizure.  Level of alertness:  lethargic  AEDs during EEG study: LTG, vimpat, LEV, pregabalin  Technical aspects: This EEG study was done with scalp electrodes positioned according to the 10-20 International system of electrode placement. Electrical activity was acquired at a sampling rate of 500Hz  and reviewed with a high frequency filter of 70Hz  and a low frequency filter of 1Hz . EEG data were recorded continuously and digitally stored.   Description: No posterior dominant rhythm consists of 9-10 Hz activity of moderate voltage (25-35 uV) seen predominantly in posterior head regions, symmetric and reactive to eye opening and eye closing. EEG showed continuous generalized 3 to 6 Hz theta-delta slowing. Periodic epileptiform discharges were also seen in left hemisphere initially at 1.5-2Hz  and gradually at 1-1.5hz  These discharges were at times rhythmic without definite evolution. .  ABNORMALITY -Continuous slow, generalized -Periodic epileptiform discharges, lateralized left hemisphere  IMPRESSION: This study showed evidence of epileptogenicity arising from left hemisphere due to underlying structural abnormality. Additionally there is evidence of moderate = diffuse encephalopathy, nonspecific etiology.  EEG is improving compared to previous day.   Carlos [name] 

## 2020-07-09 NOTE — Progress Notes (Signed)
vLTM EEG complete. No skin breakdown 

## 2020-07-09 NOTE — Procedures (Signed)
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  Level of alertness:lethargic  AEDs during EEG study:LTG, vimpat, LEV, pregabalin  Technical aspects: This EEG study was done with scalp electrodes positioned according to the 10-20 International system of electrode placement. Electrical activity was acquired at a sampling rate of 500Hz  and reviewed with a high frequency filter of 70Hz  and a low frequency filter of 1Hz . EEG data were recorded continuously and digitally stored.   Description:Noposterior dominant rhythm consists of 9-10 Hz activity of moderate voltage (25-35 uV) seen predominantly in posterior head regions, symmetric and reactive to eye opening and eye closing. EEG showed continuous generalized 3 to 6 Hz theta-delta slowing.Periodic epileptiform discharges were also seen in left hemisphere initially at 1-1.5hz  These discharges were at times rhythmic without definite evolution.   Sharp waves were also seen in right frontotemporal region.  ABNORMALITY -Continuousslow, generalized -Periodic epileptiform discharges, lateralized left hemisphere -Sharp waves right frontotemporal region  IMPRESSION: This studyshowed evidence of epileptogenicity arising from left hemisphere due to underlying structural abnormality. There is also evidence of independent epileptogenic city from right frontotemporal region.  Additionally there is evidence  ofmoderate diffuse encephalopathy, nonspecific etiology.  Carlos [name] 

## 2020-07-10 ENCOUNTER — Inpatient Hospital Stay (HOSPITAL_COMMUNITY)

## 2020-07-10 LAB — CBC
HCT: 36.6 % — ABNORMAL LOW (ref 39.0–52.0)
Hemoglobin: 11.5 g/dL — ABNORMAL LOW (ref 13.0–17.0)
MCH: 24.8 pg — ABNORMAL LOW (ref 26.0–34.0)
MCHC: 31.4 g/dL (ref 30.0–36.0)
MCV: 79 fL — ABNORMAL LOW (ref 80.0–100.0)
Platelets: 230 10*3/uL (ref 150–400)
RBC: 4.63 MIL/uL (ref 4.22–5.81)
RDW: 13.2 % (ref 11.5–15.5)
WBC: 6.8 10*3/uL (ref 4.0–10.5)
nRBC: 0 % (ref 0.0–0.2)

## 2020-07-10 MED ORDER — LACOSAMIDE 10 MG/ML PO SOLN
200.0000 mg | Freq: Every evening | ORAL | Status: DC
  Filled 2020-07-10: qty 21

## 2020-07-10 MED ORDER — LEVETIRACETAM 100 MG/ML PO SOLN
1500.0000 mg | Freq: Two times a day (BID) | ORAL | Status: DC
  Filled 2020-07-10: qty 15

## 2020-07-10 MED ORDER — LACOSAMIDE 50 MG PO TABS: 100.0000 mg | ORAL_TABLET | Freq: Every day | ORAL | Status: DC

## 2020-07-10 MED ORDER — OSMOLITE 1.5 CAL PO LIQD
960.0000 mL | ORAL | Status: DC
  Filled 2020-07-10: qty 1000

## 2020-07-10 MED ORDER — PROSOURCE TF PO LIQD: 45.0000 mL | Freq: Two times a day (BID) | ORAL | Status: DC

## 2020-07-10 MED ORDER — OSMOLITE 1.5 CAL PO LIQD
237.0000 mL | Freq: Three times a day (TID) | ORAL | Status: DC
  Filled 2020-07-10: qty 237

## 2020-07-10 MED ORDER — PROSOURCE TF PO LIQD
45.0000 mL | Freq: Two times a day (BID) | ORAL | Status: DC
  Filled 2020-07-10 (×2): qty 45

## 2020-07-10 MED ORDER — LACOSAMIDE 10 MG/ML PO SOLN: 100.0000 mg | ORAL | Status: DC

## 2020-07-10 MED ORDER — LEVETIRACETAM 750 MG PO TABS
1500.0000 mg | ORAL_TABLET | Freq: Two times a day (BID) | ORAL | Status: DC
  Administered 2020-07-10 – 2020-07-11 (×2): 1500 mg via ORAL
  Filled 2020-07-10 (×3): qty 2

## 2020-07-10 MED ORDER — LACOSAMIDE 200 MG PO TABS
200.0000 mg | ORAL_TABLET | Freq: Every evening | ORAL | Status: DC
  Administered 2020-07-10: 200 mg via ORAL
  Filled 2020-07-10: qty 1

## 2020-07-10 MED ORDER — LACOSAMIDE 50 MG PO TABS
100.0000 mg | ORAL_TABLET | Freq: Every day | ORAL | Status: DC
  Administered 2020-07-11: 100 mg via ORAL
  Filled 2020-07-10: qty 2

## 2020-07-10 MED ORDER — FREE WATER: 130.0000 mL | Status: DC

## 2020-07-10 MED ORDER — LACOSAMIDE 200 MG PO TABS: 200.0000 mg | ORAL_TABLET | Freq: Every day | ORAL | Status: DC

## 2020-07-10 MED ORDER — OSMOLITE 1.5 CAL PO LIQD
237.0000 mL | Freq: Four times a day (QID) | ORAL | Status: DC
  Filled 2020-07-10 (×3): qty 237

## 2020-07-10 NOTE — Progress Notes (Signed)
Family Medicine Teaching Service Daily Progress Note Intern Pager: [phone]  Patient name: Carlos [name] Medical record number: [id number] Date of birth: December 17, 1985 Age: 34 y.o. Gender: male  Primary Care Provider: Reece Leader, DO Consultants: Neurology   Code Status: Full Code  Pt Overview and Major Events to Date:  8/21 Admitted  Assessment and Plan: Carlos [name] is a 34 y.o. male presenting with seizures. PMH is significant forintractable complex partial seizures with secondary generalization, double hemiparesis, significant intellectual delay, dysphagia, constipation.  Seizure Underlying history of seizures presenting with multiple seizures (over 12 seizures in the past 2 days) in the setting of constipation.  There has been some concern for vomiting resulting in him not being able to keep his seizure medications down; however, parents denying vomiting so could represent therapy failure.  Neurology following, patient vomited. Appears to be improving on EEG. UA unremarkable. Patient with improved alertness but still somnolent this morning, may be still postictal or oversedated from medications.  Still unable to take p.o. We will continue to give AEDs through IV and NGT until able to take p.o. Could consider converting IV meds to p.o. via NGT, will discuss with neurology. - continue AEDs per neuro, will defer to them for any changes - seizure precautions - continuous feeds via NGT per nutrition  Constipation Presented with 5 days of constipation.  Per mother, patient had a large BM on 8/21.  Had another BM yesterday. At home takes MiraLAX twice daily. - daily Miralax via NGT - FLEET enema prn  Other problems chronic and stable: Eczema  FEN/GI: NPO, continuous feeds via NGT per nutrition  PPx: enoxaparin  Disposition: progressive  Subjective:  NAOE.  Father present at bedside.  He states that his alertness has improved since yesterday.  No concerns at this  time.  Objective: Temp:  [97.8 F (36.6 C)-99.3 F (37.4 C)] 99 F (37.2 C) (08/24 0343) Pulse Rate:  [88-119] 105 (08/24 0343) Resp:  [10-16] 13 (08/24 0343) BP: (111-139)/(69-90) 119/85 (08/24 0343) SpO2:  [98 %-100 %] 98 % (08/24 0343) Physical Exam: General: Somnolent, NAD Cardiovascular: RRR, no murmurs Respiratory: CTAB, breathing comfortably on room air Abdomen: soft, non-tender, +BS Extremities: WWP Neuro: somnolent, contractures in all extremities  Laboratory: Recent Labs  Lab 07/08/20 0225 07/09/20 0210 07/10/20 0305  WBC 5.9 7.1 6.8  HGB 11.9* 12.0* 11.5*  HCT 38.3* 38.1* 36.6*  PLT 245 232 230   Recent Labs  Lab 07/07/20 0132 07/08/20 0225 07/09/20 0210  NA 144 142 141  K 4.4 3.5 3.5  CL 106 108 107  CO2 22 26 25   BUN 24* 13 <5*  CREATININE 1.39* 0.75 0.66  CALCIUM 10.9* 9.0 8.9  PROT 9.3*  --  6.5  BILITOT 0.4  --  0.4  ALKPHOS 68  --  41  ALT 10  --  9  AST 29  --  31  GLUCOSE 88 103* 110*   Urinalysis    Component Value Date/Time   COLORURINE YELLOW 07/09/2020 1744   APPEARANCEUR CLEAR 07/09/2020 1744   LABSPEC 1.010 07/09/2020 1744   PHURINE 6.0 07/09/2020 1744   GLUCOSEU NEGATIVE 07/09/2020 1744   HGBUR NEGATIVE 07/09/2020 1744   BILIRUBINUR NEGATIVE 07/09/2020 1744   KETONESUR NEGATIVE 07/09/2020 1744   PROTEINUR NEGATIVE 07/09/2020 1744   UROBILINOGEN 0.2 03/06/2013 1354   NITRITE NEGATIVE 07/09/2020 1744   LEUKOCYTESUR NEGATIVE 07/09/2020 1744      Imaging/Diagnostic Tests: EEG 8/23 Impression: This studyshowed evidence of epileptogenicity arising from left hemisphere  due tounderlying structural abnormality. There is also evidence of independent epileptogenic city from right frontotemporal region.  Additionally there is evidence ofmoderate diffuse encephalopathy, nonspecific etiology.  Littie Deeds, MD 07/10/2020, 7:02 AM PGY-1, [facility] Family Medicine FPTS Intern pager: [phone], text pages welcome

## 2020-07-10 NOTE — Progress Notes (Signed)
Patient pulled NG tube out. MD paged. MD advised not to put tube back in due to patient tolerating PO's. Will continue to monitor. Melony Overly, RN

## 2020-07-10 NOTE — Progress Notes (Signed)
Patient returned from Xray. Respond to verbal stimuli and touch. Mom in room. Monitor placed back on Stand NSR. HR 98 O2 sat 98%, Resp 20. Chest sounds clear. NG intact and clamped. Bowel sounds present. Condom Cath intact draining yellow liquid. Warm blankets given to Mom. No signs of distress or pain.

## 2020-07-10 NOTE — Progress Notes (Addendum)
Subjective: No definite seizures overnight more awake today.   ROS: Unable to obtain as patient is nonverbal at baseline  Examination  Vital signs in last 24 hours: Temp:  [97.8 F (36.6 C)-99.2 F (37.3 C)] 98.7 F (37.1 C) (08/24 0907) Pulse Rate:  [88-119] 95 (08/24 0907) Resp:  [10-17] 17 (08/24 0907) BP: (111-139)/(69-97) 132/97 (08/24 0907) SpO2:  [98 %-100 %] 100 % (08/24 0907)  General: lying in bed, not in apparent distress CVS: pulse-normal rate and rhythm RS: breathing comfortably, CTA B Extremities: normal, warm Neuro: Awake, able to slowly track examiner in room, unable to follow commands at baseline, moves all 4 extremities against gravity  Basic Metabolic Panel: Recent Labs  Lab 07/07/20 0132 07/08/20 0225 07/09/20 0210  NA 144 142 141  K 4.4 3.5 3.5  CL 106 108 107  CO2 22 26 25   GLUCOSE 88 103* 110*  BUN 24* 13 <5*  CREATININE 1.39* 0.75 0.66  CALCIUM 10.9* 9.0 8.9    CBC: Recent Labs  Lab 07/07/20 0132 07/08/20 0225 07/09/20 0210 07/10/20 0305  WBC 14.0* 5.9 7.1 6.8  NEUTROABS 12.3*  --   --   --   HGB 14.9 11.9* 12.0* 11.5*  HCT 48.8 38.3* 38.1* 36.6*  MCV 79.0* 78.8* 78.4* 79.0*  PLT 333 245 232 230     Coagulation Studies: No results for input(s): LABPROT, INR in the last 72 hours.  Imaging No new brain imaging overnight  ASSESSMENT AND PLAN: 34 year old male with history of epilepsy who presented with breakthrough seizures which have since resolved.  Epilepsy with breakthrough seizures Acute encephalopathy, postictal and iatrogenic (improving) -No further seizures overnight.  Patient appears more awake today. -Per patient's mother yesterday patient has increased events of seizures about once a year without any clear cause after which he returns to baseline.  No clear etiology at this time.  Recommendations  -Continue Vimpat 100 mg every morning and 200 mg nightly, Keppra 1500 mg twice daily, lamotrigine 225 mg twice daily,  pregabalin 100 mg 3 times daily. -If any further seizures, can increase pregabalin. -Continue seizure precautions -As needed IV Ativan for generalized tonic-clonic seizure lasting more than 2 minutes of focal seizure lasting more than 5 minutes -Management of rest of comorbidities per primary team -Follow-up with neurology in 4-6 weeks after discharge  I have spent a total of 25 minuteswith the patient reviewing hospitalnotes,  test results, labs and examining the patient as well as establishing an assessment and plan that was discussed personally with the patient's team.>50% of time was spent in direct patient care.  Thank you for allowing 32 to participate in the care of this patient.  Neurology will follow peripherally.  Please call us if you have any further questions.   [place] [facility] For questions after 5pm please refer to AMION to reach the Neurologist on call

## 2020-07-10 NOTE — Progress Notes (Signed)
MEWS score to be initiated for critical changes per MD order.

## 2020-07-10 NOTE — Progress Notes (Addendum)
Initial Nutrition Assessment  DOCUMENTATION CODES:   Underweight  INTERVENTION:  Monitor magnesium, potassium, and phosphorus daily for at least 3 days, MD to replete as needed, as pt is at risk for refeeding syndrome.  Via NGT: -Initiate Osmolite 1.5 cal @ 61ml/hr, advance 49ml/hr Q8H until goal rate of 3ml/hr is reached ( ) -71ml Prosource TF BID - free water Q4H (or per MD)  Tube feeding regimen will provide 1520 kcals, 82 grams of protein, and free water ( total free water with flushes)  NUTRITION DIAGNOSIS:   Inadequate oral intake related to dysphagia as evidenced by other (comment) (need for initiation of enteral nutrition; h/o dysphagia).    GOAL:   Patient will meet greater than or equal to 90% of their needs    MONITOR:   TF tolerance, Weight trends, Labs, I & O's  REASON FOR ASSESSMENT:   Consult Enteral/tube feeding initiation and management  ASSESSMENT:   Pt admitted with seizures. 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  Pt unable to give history.  Per H&P, pt's seizures likely 2/2 being unable to tolerate PO seizure medications due to N/V/constipation for 5 days PTA. Pt's parents typically feed the pt and reported that pt hadn't eaten anything in the last few days PTA. Of note, pt's mother is denying reports that pt was vomiting PTA.   RD consulted to initiate/manage enteral nutrition. No access listed in LDAs. Discussed pt with RN who reports pt with NG tube. RN to add to LDAs. NGT confirmed via abdominal xray; tube tip in distal stomach.   Of note, DO ordered Osmolite 1.5 cal TID; order has not been started at time of this note. Will transition pt to continuous feeding and titrate slowly due to N/V.   Labs reviewed.  Medications: Miralax  NUTRITION - FOCUSED PHYSICAL EXAM:  Unable to  perform at this time.   Diet Order:   Diet Order    None      EDUCATION NEEDS:   No education needs have been identified at this time  Skin:  Skin Assessment: Reviewed RN Assessment  Last BM:  PTA  Height:   Ht Readings from Last 1 Encounters:  07/07/20 5\' 6"  (1.676 m)    Weight:   Wt Readings from Last 10 Encounters:  07/07/20 49 kg  06/30/18 49.9 kg  08/18/17 48.5 kg  08/04/17 48.5 kg  08/21/16 48.5 kg  08/14/16 48.5 kg  05/21/16 47.6 kg  04/23/16 48.3 kg  02/05/16 45.4 kg  07/24/15 46.7 kg    BMI:  Body mass index is 17.43 kg/m.  Estimated Nutritional Needs:   Kcal:  1500-1700  Protein:  75-85 grams  Fluid:  <1.5L/d    09/23/15, MS, RD, LDN RD pager number and weekend/on-call pager number located in Amion.

## 2020-07-10 NOTE — Progress Notes (Signed)
  Speech Language Pathology Treatment: Dysphagia  Patient Details Name: Carlos [name] MRN: [id number] DOB: 08/03/1986 Today's Date: 07/10/2020 Time: 2703-5009 SLP Time Calculation (min) (ACUTE ONLY): 25 min  Assessment / Plan / Recommendation Clinical Impression  SLP followed up for PO trials, pt with improved alertness as compared to last ST encounter per chart review. Family at bedside. Pt stimulable for puree, ice chips, thin liquids via cup following diligent oral care. Pt with anterior spillage, reduced control of bolus orally, father states this is baseline for pt. Per palpation pt appeared to have decreased laryngeal elevation and multiple swallows evidenced as well as intermittent wet vocal quality. Given increased risk of aspiration recommend proceed with instrumental assessment to guide safety for diet recommendations. Family in agreement with plan. MBSS planned for this afternoon.     HPI HPI: 34 yo M with intellectual disability, intractable epilepsy, spastic quadriparesis who presented with seizure flurry in the setting of nausea/vomiting and missing his medications.  Pt underwent clinical swallow Evaluation on 07/08/2020 with recommendation for npo.  Pt's mother worried about care plan and pt being npo and SLP follow up with family present indicated.      SLP Plan  Continue with current plan of care       Recommendations  Diet recommendations: NPO Medication Administration: Whole meds with puree                Oral Care Recommendations: Oral care QID Follow up Recommendations: 24 hour supervision/assistance SLP Visit Diagnosis: Dysphagia, unspecified (R13.10) Plan: Continue with current plan of care       GO                Samaiyah Howes E Jarold Macomber MA, CCC-SLP  Acute Rehabilitation Services.  07/10/2020, 11:41 AM

## 2020-07-10 NOTE — Progress Notes (Signed)
Modified Barium Swallow Progress Note  Patient Details  Name: Carlos [name] MRN: [id number] Date of Birth: 05/30/1986  Today's Date: 07/10/2020  Modified Barium Swallow completed.  Full report located under Chart Review in the Imaging Section.  Brief recommendations include the following:  Clinical Impression  Pt presents with a functional swallow despite suspected baseline impairments in both oral and pharyngeal function; mild to moderate level impairments exhibited during MBSS. Oral deficits include reduced labial seal with frequent anterior spillage of bolus (greatest with more viscous textures), reduced bolus cohesion, oral residuals, and intermittent lingual pumping. Pharyngeal deficits included weakened base of tongue retraction, reduced pharyngeal stripping wave, reduced laryngeal elevation and reduced timing and efficiency of laryngeal vestibule closure. Intermittent trace transient penetration exhibited with thin liquids along with one instance of trace penetration to the true vocal cords. This was subsequently refelexively cleared. No aspiration evidenced. Pharyngeal residuals noted in the valleculae and pyriform sinuses (worsened with solid PO). Pt with audible swallows and reflexive multiple swallows per bolus that assisted to decrease residual amount. Recommend dysphagia 1 (puree) diet and thin liquids with medicines crushed.      Swallow Evaluation Recommendations       SLP Diet Recommendations: Dysphagia 1 (Puree) solids;Thin liquid   Liquid Administration via: Cup   Medication Administration: Crushed with puree   Supervision: Full assist for feeding;Full supervision/cueing for compensatory strategies   Compensations: Slow rate;Small sips/bites;Minimize environmental distractions;Monitor for anterior loss   Postural Changes: Remain semi-upright after after feeds/meals (Comment);Seated upright at 90 degrees   Oral Care Recommendations: Oral care BID        Ardis Rowan  Melchizedek Espinola MA, CCC-SLP  Acute Rehabilitation Services  07/10/2020,3:03 PM

## 2020-07-11 ENCOUNTER — Other Ambulatory Visit: Payer: Self-pay | Admitting: Family Medicine

## 2020-07-11 LAB — CBC
HCT: 37.9 % — ABNORMAL LOW (ref 39.0–52.0)
Hemoglobin: 11.9 g/dL — ABNORMAL LOW (ref 13.0–17.0)
MCH: 24.7 pg — ABNORMAL LOW (ref 26.0–34.0)
MCHC: 31.4 g/dL (ref 30.0–36.0)
MCV: 78.8 fL — ABNORMAL LOW (ref 80.0–100.0)
Platelets: 259 10*3/uL (ref 150–400)
RBC: 4.81 MIL/uL (ref 4.22–5.81)
RDW: 13.1 % (ref 11.5–15.5)
WBC: 7.3 10*3/uL (ref 4.0–10.5)
nRBC: 0 % (ref 0.0–0.2)

## 2020-07-11 MED ORDER — PREGABALIN 100 MG PO CAPS: 100.0000 mg | ORAL_CAPSULE | Freq: Three times a day (TID) | ORAL | 0 refills | Status: DC

## 2020-07-11 MED ORDER — LEVETIRACETAM 750 MG PO TABS
1500.0000 mg | ORAL_TABLET | Freq: Two times a day (BID) | ORAL | 0 refills | Status: DC
Start: 2020-07-11 — End: 2020-10-02

## 2020-07-11 MED ORDER — POLYETHYLENE GLYCOL 3350 17 G PO PACK
17.0000 g | PACK | Freq: Every day | ORAL | Status: DC
  Administered 2020-07-11: 17 g via ORAL
  Filled 2020-07-11: qty 1

## 2020-07-11 MED ORDER — PREGABALIN 100 MG PO CAPS
100.0000 mg | ORAL_CAPSULE | Freq: Three times a day (TID) | ORAL | Status: DC
  Administered 2020-07-11: 100 mg via ORAL
  Filled 2020-07-11: qty 1

## 2020-07-11 NOTE — TOC Transition Note (Signed)
Transition of Care [facility]) - CM/SW Discharge Note   Patient Details  Name: Carlos [name] MRN: [id number] Date of Birth: Jun 19, 1986  [facility] CM/SW Contact:  Kermit Balo, RN Phone Number: 07/11/2020, 12:55 PM   Clinical Narrative:    Pt discharging home with parents. No needs per TOC.   Final next level of care: Home/Self Care Barriers to Discharge: No Barriers Identified   Patient Goals and CMS Choice        Discharge Placement                       Discharge Plan and Services                                     Social Determinants of Health (SDOH) Interventions     Readmission Risk Interventions No flowsheet data found.

## 2020-07-11 NOTE — Progress Notes (Signed)
  Speech Language Pathology Treatment: Dysphagia  Patient Details Name: Carlos [name] MRN: [id number] DOB: May 11, 1986 Today's Date: 07/11/2020 Time: 9390-3009 SLP Time Calculation (min) (ACUTE ONLY): 15 min  Assessment / Plan / Recommendation Clinical Impression  Mr. Carlos [name] was seen for a therapeutic diet tolerance with mother present to aid as historian. Mother reports "[name] is back to himself today!" He is a total assist for feeding at baseline with baseline diet of purees and thin liquids via cup. Mother fed him with SLP observing with pt having intermittent anterior spillage d/t lingual thrusting. Pt had no s/s aspiration. He was also seen with thin liquids via cup (unable to take from straw). Again, pt had no difficulty. Mother reports pt is baseline. No further ST indicated.   HPI HPI: 34 yo M with intellectual disability, intractable epilepsy, spastic quadriparesis who presented with seizure flurry in the setting of nausea/vomiting and missing his medications.       SLP Plan  All goals met;Discharge SLP treatment due to (comment)       Recommendations  Diet recommendations: Dysphagia 1 (puree);Thin liquid Liquids provided via: Cup Medication Administration: Crushed with puree Supervision: Staff to assist with self feeding;Trained caregiver to feed patient Compensations: Slow rate;Small sips/bites Postural Changes and/or Swallow Maneuvers: Seated upright 90 degrees                Oral Care Recommendations: Oral care BID Follow up Recommendations: None SLP Visit Diagnosis: Dysphagia, oropharyngeal phase (R13.12) Plan: All goals met;Discharge SLP treatment due to (comment)                     Baird Polinski P. Mishika Flippen, M.S., CCC-SLP Speech-Language Pathologist Acute Rehabilitation Services Pager: [phone] 07/11/2020, 9:15 AM

## 2020-07-11 NOTE — Progress Notes (Signed)
Discharge orders discussed with patient caregivers. All questions answered. IV removed, Telemetry discontinued. All belongings sent with patient caregivers. They will provide transport home. Melony Overly, RN

## 2020-07-11 NOTE — Discharge Summary (Addendum)
[facility]  Patient name: Carlos [name] Medical record number: [id number] Date of birth: 12/06/1985 Age: 34 y.o. 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)   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HCC)   Colonic constipation   Breakthrough seizure (HCC)   AKI (acute kidney injury) (HCC)   Hyperproteinemia   RBC microcytosis   Chronic Dilation of stomach   Oral phase dysphagia    Disposition: home  Discharge Condition: Stable, improved  Discharge Exam:  General: Awake and alert, smiling, NAD Cardiovascular: RRR, no murmurs Respiratory: CTAB, breathing comfortably on room air Abdomen: soft, non-tender, +BS Extremities: WWP Neuro: Mute, no eye gaze deviation, able to track examiner in room, unable to follow commands (baseline), contractures in all extremities   Brief Hospital Course:  Carlos CalQuintin Paro is a 34 y.o. 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 Hospital course outlined by problem below:  Seizures Patient with underlying history of seizures presenting with multiple seizures (over 12 grand mal seizures in the past 2 days) in the setting of 5 days of constipation.  Longest seizure lasting over 2 minutes.  On presentation, he apparently had three seizures on the way to the hospital and one seizure in the ED.  He was given 2 mg  lorazepam and loaded with levetiracetam 1000 mg in the ED.  He was evaluated by neurology and was put on continuous EEG.  He presented with altered mental status due to postictal state, so was made NPO.  He was continued on his home AEDs via IV (levetiracetam, lacosamide) and was restarted on his home lamotrigine later that evening after NGT was successfully placed.  Levetiracetam was increased and pregabalin was added to his regimen.  He continued to have seizures and epileptiform activity on EEG which gradually improved, and on day of discharge, he remained seizure-free for over 48 hours.  He returned to his baseline mental status was able to tolerate p.o. (he passed MBS on 8/24).  Constipation Patient presented with 5 days of constipation.  CT abdomen pelvis revealed significant stool volume and dilated bowel.  Patient was treated with MiraLAX and Fleet enema as needed with resolution of constipation.  AKI Patient presented with elevated serum creatinine 1.39 on presentation.  He was treated with IV fluids with good response.  On the day of discharge, his most recent serum creatinine was 0.66.   Issues for Follow Up:  Discharge AED regimen: Vimpat 100 mg every morning and 200 mg nightly, Keppra 1500 mg twice daily, lamotrigine 225 mg twice daily, pregabalin 100 mg 3 times daily  Significant Procedures: none  Significant Labs and Imaging:  Recent Labs  Lab 07/09/20 0210 07/10/20 0305 07/11/20 0143  WBC 7.1 6.8 7.3  HGB 12.0* 11.5* 11.9*  HCT 38.1* 36.6* 37.9*  PLT 232 230 259   Recent Labs  Lab 07/07/20 0132 07/07/20 0132 07/08/20 0225 07/09/20 0210  NA 144  --  142 141  K 4.4   < > 3.5 3.5  CL 106  --  108  107  CO2 22  --  26 25  GLUCOSE 88  --  103* 110*  BUN 24*  --  13 <5*  CREATININE 1.39*  --  0.75 0.66  CALCIUM 10.9*  --  9.0 8.9  ALKPHOS 68  --   --  41  AST 29  --   --  31  ALT 10  --   --  9  ALBUMIN 4.9  --   --  3.2*   < > = values in this interval not  displayed.   DG Abd 1 View  Result Date: 07/07/2020 CLINICAL DATA:  Nasogastric tube placement. EXAM: ABDOMEN - 1 VIEW COMPARISON:  April 23, 2016 FINDINGS: A nasogastric tube is seen with its distal tip overlying the body of the stomach. The bowel gas pattern is normal. No radio-opaque calculi or other significant radiographic abnormality are seen. IMPRESSION: Nasogastric tube positioning, as described above. Electronically Signed   By: Aram Candela M.D.   On: 07/07/2020 20:20   CT ABDOMEN PELVIS W CONTRAST  Result Date: 07/07/2020 CLINICAL DATA:  Constipation and emesis EXAM: CT ABDOMEN AND PELVIS WITH CONTRAST TECHNIQUE: Multidetector CT imaging of the abdomen and pelvis was performed using the standard protocol following bolus administration of intravenous contrast. CONTRAST:  37mL OMNIPAQUE IOHEXOL 300 MG/ML  SOLN COMPARISON:  None. FINDINGS: LOWER CHEST: Normal. HEPATOBILIARY: There are multiple hepatic hemangiomas, the largest measuring 2.7 cm. Nonspecific hypodensity at the hepatic dome measures 11 mm. The gallbladder is normal. PANCREAS: Normal pancreas. No ductal dilatation or peripancreatic fluid collection. SPLEEN: Normal. ADRENALS/URINARY TRACT: The adrenal glands are normal. No hydronephrosis, nephroureterolithiasis or solid renal mass. The urinary bladder is normal for degree of distention STOMACH/BOWEL: No small bowel dilatation or inflammation. Moderate colonic stool volume. The stomach is markedly dilated. Appendix not visualized. VASCULAR/LYMPHATIC: Normal course and caliber of the major abdominal vessels. No abdominal or pelvic lymphadenopathy. REPRODUCTIVE: Normal prostate size with symmetric seminal vesicles. MUSCULOSKELETAL. No bony spinal canal stenosis or focal osseous abnormality. OTHER: None. IMPRESSION: 1. Markedly dilated stomach with moderate colonic stool volume. 2. Multiple hepatic hemangiomas. 3. Moderate colonic stool volume. Electronically Signed   By: Deatra Robinson M.D.    On: 07/07/2020 05:35   DG Swallowing Func-Speech Pathology  Result Date: 07/10/2020 Objective Swallowing Evaluation: Type of Study: MBS-Modified Barium Swallow Study  Patient Details Name: Nakhi Choi MRN: [id number] Date of Birth: 12-Jan-1986 Today's Date: 07/10/2020 Time: SLP Start Time (ACUTE ONLY): 1350 -SLP Stop Time (ACUTE ONLY): 1410 SLP Time Calculation (min) (ACUTE ONLY): 20 min Past Medical History: Past Medical History: Diagnosis Date  Agenesis of corpus callosum (HCC)   Cerebral palsy (HCC)   Folliculitis barbae 07/10/2011  Groin abscess 03/02/2008  Left hip pain 02/05/2016  Pneumonia   Seizures (HCC)   Status epilepticus (HCC)   Swelling of joint of pelvic region or thigh 02/05/2016  Ventriculomegaly of brain, congenital [facility])  Past Surgical History: Past Surgical History: Procedure Laterality Date  STRABISMUS SURGERY    in [place] HPI: 34 yo M with intellectual disability, intractable epilepsy, spastic quadriparesis who presented with seizure flurry in the setting of nausea/vomiting and missing his medications.  Subjective: alert, with mom at bedside Assessment / Plan / Recommendation CHL IP CLINICAL IMPRESSIONS 07/10/2020 Clinical Impression Pt presents with a functional swallow despite suspected baseline impairments in both oral and pharyngeal function; mild to moderate level impairments exhibited during MBSS. Oral deficits include reduced labial seal with frequent anterior spillage of bolus (greatest with more viscous textures),  reduced bolus cohesion, oral residuals, and intermittent lingual pumping. Pharyngeal deficits included weakened base of tongue retraction, reduced pharyngeal stripping wave, reduced laryngeal elevation and reduced timing and efficiency of laryngeal vestibule closure. Intermittent trace transient penetration exhibited with thin liquids along with one instance of trace penetration to the true vocal cords. This was subsequently refelexively cleared. No aspiration evidenced.  Pharyngeal residuals noted in the valleculae and pyriform sinuses (worsened with solid PO). Pt with audible swallows and reflexive multiple swallows per bolus that assisted to decrease residual amount. Recommend dysphagia 1 (puree) diet and thin liquids with medicines crushed.    SLP Visit Diagnosis Dysphagia, oropharyngeal phase (R13.12) Attention and concentration deficit following -- Frontal lobe and executive function deficit following -- Impact on safety and function Mild aspiration risk;Moderate aspiration risk   CHL IP TREATMENT RECOMMENDATION 07/10/2020 Treatment Recommendations Therapy as outlined in treatment plan below   Prognosis 07/10/2020 Prognosis for Safe Diet Advancement Good Barriers to Reach Goals Cognitive deficits Barriers/Prognosis Comment -- CHL IP DIET RECOMMENDATION 07/10/2020 SLP Diet Recommendations Dysphagia 1 (Puree) solids;Thin liquid Liquid Administration via Cup Medication Administration Crushed with puree Compensations Slow rate;Small sips/bites;Minimize environmental distractions;Monitor for anterior loss Postural Changes Remain semi-upright after after feeds/meals (Comment);Seated upright at 90 degrees   CHL IP OTHER RECOMMENDATIONS 07/10/2020 Recommended Consults -- Oral Care Recommendations Oral care BID Other Recommendations --   CHL IP FOLLOW UP RECOMMENDATIONS 07/10/2020 Follow up Recommendations 24 hour supervision/assistance   CHL IP FREQUENCY AND DURATION 07/10/2020 Speech Therapy Frequency (ACUTE ONLY) min 1 x/week Treatment Duration 1 week      CHL IP ORAL PHASE 07/10/2020 Oral Phase Impaired Oral - Pudding Teaspoon -- Oral - Pudding Cup -- Oral - Honey Teaspoon -- Oral - Honey Cup -- Oral - Nectar Teaspoon -- Oral - Nectar Cup Weak lingual manipulation;Lingual pumping;Incomplete tongue to palate contact;Reduced posterior propulsion;Right anterior bolus loss;Left anterior bolus loss;Lingual/palatal residue;Piecemeal swallowing;Delayed oral transit;Decreased bolus cohesion Oral -  Nectar Straw -- Oral - Thin Teaspoon -- Oral - Thin Cup Right anterior bolus loss;Left anterior bolus loss;Lingual pumping;Weak lingual manipulation;Incomplete tongue to palate contact;Reduced posterior propulsion;Lingual/palatal residue Oral - Thin Straw -- Oral - Puree Weak lingual manipulation;Lingual pumping;Lingual/palatal residue;Reduced posterior propulsion;Incomplete tongue to palate contact;Delayed oral transit Oral - Mech Soft Decreased bolus cohesion;Delayed oral transit;Lingual/palatal residue;Pocketing in anterior sulcus;Reduced posterior propulsion;Weak lingual manipulation;Impaired mastication;Lingual pumping;Incomplete tongue to palate contact Oral - Regular -- Oral - Multi-Consistency -- Oral - Pill -- Oral Phase - Comment --  CHL IP PHARYNGEAL PHASE 07/10/2020 Pharyngeal Phase Impaired Pharyngeal- Pudding Teaspoon -- Pharyngeal -- Pharyngeal- Pudding Cup -- Pharyngeal -- Pharyngeal- Honey Teaspoon -- Pharyngeal -- Pharyngeal- Honey Cup -- Pharyngeal -- Pharyngeal- Nectar Teaspoon -- Pharyngeal -- Pharyngeal- Nectar Cup Pharyngeal residue - valleculae;Pharyngeal residue - pyriform;Pharyngeal residue - posterior pharnyx;Reduced laryngeal elevation;Reduced pharyngeal peristalsis;Reduced tongue base retraction Pharyngeal -- Pharyngeal- Nectar Straw -- Pharyngeal -- Pharyngeal- Thin Teaspoon -- Pharyngeal -- Pharyngeal- Thin Cup Pharyngeal residue - valleculae;Pharyngeal residue - pyriform;Penetration/Aspiration before swallow;Penetration/Aspiration during swallow;Reduced tongue base retraction;Reduced laryngeal elevation;Reduced pharyngeal peristalsis Pharyngeal Material enters airway, remains ABOVE vocal cords then ejected out;Material does not enter airway;Material enters airway, CONTACTS cords and then ejected out Pharyngeal- Thin Straw -- Pharyngeal -- Pharyngeal- Puree Pharyngeal residue - valleculae;Pharyngeal residue - pyriform;Pharyngeal residue - posterior pharnyx;Reduced pharyngeal  peristalsis;Delayed swallow initiation-vallecula;Reduced laryngeal elevation;Reduced tongue base retraction Pharyngeal -- Pharyngeal- Mechanical Soft Pharyngeal residue - valleculae;Pharyngeal residue - pyriform;Pharyngeal residue - posterior pharnyx;Reduced pharyngeal peristalsis;Reduced laryngeal elevation;Reduced tongue base retraction Pharyngeal -- Pharyngeal- Regular -- Pharyngeal --  Pharyngeal- Multi-consistency -- Pharyngeal -- Pharyngeal- Pill -- Pharyngeal -- Pharyngeal Comment --  No flowsheet data found. Chelsea E Hartness MA, CCC-SLP 07/10/2020, 3:01 PM              Overnight EEG with video  Result Date: 07/07/2020 Charlsie Quest, MD     07/08/2020 10:36 AM Patient Name: Kiyoto Slomski MRN: [id number] Epilepsy Attending: Charlsie Quest Referring Physician/Provider: Dr Gean Quint Duration: 07/07/2020 0518 to 07/08/2020 0518 Patient history: Ion Gonnella is a 34 y.o. 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 EEG to evaluate for seizure. Level of alertness:  lethargic AEDs during EEG study: LTG, vimpat, LEV, pregabalin Technical aspects: This EEG study was done with scalp electrodes positioned according to the 10-20 International system of electrode placement. Electrical activity was acquired at a sampling rate of 500Hz  and reviewed with a high frequency filter of 70Hz  and a low frequency filter of 1Hz . EEG data were recorded continuously and digitally stored. Description: No posterior dominant rhythm consists of 9-10 Hz activity of moderate voltage (25-35 uV) seen predominantly in posterior head regions, symmetric and reactive to eye opening and eye closing. EEG showed continuous generalized 3 to 6 Hz  theta-delta slowing. Periodic epileptiform discharges were also seen in left hemisphere at 1.5-2Hz . Seven seizures without clinical signs was noted on 07/07/2020 at 0656, 0845, 0956, 1118, 1250, 1341 and 1655 arising from left posterior quadrant, lasting about 2 minutes. Concomitant eeg showed  left posterior quadrant sharp waves as well as 3-4Hz  delta slowing which then involved left hemisphere. After end of seizure, periodic epileptiform discharges were seen in left hemisphere at 1hz . ABNORMALITY -Seizure without clinical signs,left  posterior quadrant -Continuous slow, generalized -Periodic epileptiform discharges, lateralized left hemisphere IMPRESSION: This study showed seven seizures arising from left posterior quadrant, last one at 1655 on 07/07/2020, lasting avg 2 minutes as described above. After seizures stopped, patient continued to have left hemispheric PLEds at 1.5-2hz  suggestive of underlying structural abnormality. Additionally there is evidence of severe diffuse encephalopathy, nonspecific etiology but likely related to ictal/post-ictal state. Dr was notified. Priyanka 07/09/2020     Results/Tests Pending at Time of Discharge: none  Discharge Medications:  Allergies as of 07/11/2020   No Known Allergies      Medication List     TAKE these medications    cetirizine HCl 5 MG/5ML Soln Commonly known as: Zyrtec Take 10 mLs (10 mg total) by mouth daily as needed for itching.   lactulose 10 GM/15ML solution Commonly known as: CHRONULAC Give 75ml by mouth twice per day What changed:  how much to take how to take this when to take this reasons to take this additional instructions   LaMICtal 200 MG tablet Generic drug: lamoTRIgine TAKE 1 TABLET BY MOUTH TWICE DAILY. What changed:  how much to take additional instructions   LaMICtal 25 MG tablet Generic drug: lamoTRIgine TAKE 1 TABLET BY MOUTH TWICE DAILY. What changed:  how much to take additional instructions    levETIRAcetam 750 MG tablet Commonly known as: KEPPRA Take 2 tablets (1,500 mg total) by mouth 2 (two) times daily. What changed: how much to take   polyethylene glycol 17 g packet Commonly known as: MIRALAX / GLYCOLAX Take 17 g by mouth daily  as needed for mild constipation. What changed: Another medication with the same name was removed. Continue taking this medication, and follow the directions you see here.   pregabalin 100 MG capsule Commonly known as: LYRICA Take 1 capsule (100 mg total) by mouth 3 (three) times daily.   sodium phosphate Pediatric 3.5-9.5 GM/59ML enema Place 1 enema rectally every three (3) days as needed for constipation.   triamcinolone cream 0.1 % Commonly known as: KENALOG Apply small amount to rash twice per day What changed:  how much to take how to take this when to take this additional instructions   Vimpat 100 MG Tabs Generic drug: Lacosamide TAKE 1 TABLET IN THE MORNING AND 2 TABLETS IN THE EVENING. What changed: See the new instructions.        Discharge Instructions: Please refer to Patient Instructions section of EMR for full details.  Patient was counseled important signs and symptoms that should prompt return to medical care, changes in medications, dietary instructions, activity restrictions, and follow up appointments.   Follow-Up Appointments:   Instructed to follow-up with neurology in 4-6 weeks.  Littie Deeds, MD 07/11/2020, 5:16 PM PGY-1, [place] [facility]    FPTS Upper-Level Resident Addendum  I have independently interviewed and examined the patient. I have discussed the above with the original author and agree with their documentation. Please see also any attending notes.    Peggyann Shoals, DO PGY-3,  Family Medicine 07/11/2020 6:58 PM  FPTS Service pager: [phone] (text pages welcome through [facility])

## 2020-07-11 NOTE — Progress Notes (Signed)
Wrote paper rx for brand name keppra which is what he has to have for insurance

## 2020-07-11 NOTE — Discharge Instructions (Signed)
Dear Carlos [name],   Thank you so much for allowing [place] to be part of your care!  Carlos [name] was admitted to [facility] for seizures.  He was monitored with EEG and his seizure medications were adjusted by neurology.  He briefly required an NG tube because he was initially not able to take anything by mouth.  He returned back to his normal self, so neurology felt he was safe for discharge.   POST-HOSPITAL & CARE INSTRUCTIONS 1. Continue these seizure medications: Vimpat 100 mg every morning and 200 mg nightly, Keppra 1500 mg twice daily, lamotrigine225 mg twice daily, pregabalin 100 mg 3 times daily. 2. Please follow-up with neurology in 4-6 weeks. 3. Please let PCP/Specialists know of any changes that were made.  4. Please see medications section of this packet for any medication changes.   DOCTOR'S APPOINTMENT & FOLLOW UP CARE INSTRUCTIONS  No future appointments.  RETURN PRECAUTIONS: Please return if he develops further seizures or has any change in mental status.  Take care and be well!  Family Medicine Teaching Service  [place]  [facility]  [address] [place], [place] [address] [phone]

## 2020-07-12 LAB — LAMOTRIGINE LEVEL: Lamotrigine Lvl: 8.3 ug/mL (ref 2.0–20.0)

## 2020-08-12 ENCOUNTER — Other Ambulatory Visit (INDEPENDENT_AMBULATORY_CARE_PROVIDER_SITE_OTHER): Payer: Self-pay | Admitting: Family

## 2020-08-12 DIAGNOSIS — G40219 Localization-related (focal) (partial) symptomatic epilepsy and epileptic syndromes with complex partial seizures, intractable, without status epilepticus: Secondary | ICD-10-CM

## 2020-08-12 DIAGNOSIS — G40209 Localization-related (focal) (partial) symptomatic epilepsy and epileptic syndromes with complex partial seizures, not intractable, without status epilepticus: Secondary | ICD-10-CM

## 2020-08-12 DIAGNOSIS — G40319 Generalized idiopathic epilepsy and epileptic syndromes, intractable, without status epilepticus: Secondary | ICD-10-CM

## 2020-08-13 NOTE — Telephone Encounter (Signed)
Please send to the pharmacy °

## 2020-08-14 ENCOUNTER — Telehealth (INDEPENDENT_AMBULATORY_CARE_PROVIDER_SITE_OTHER): Payer: Self-pay | Admitting: Family

## 2020-08-14 NOTE — Telephone Encounter (Signed)
Patient is due for a follow up appointment with [name]. Virtual appointment is ok if guardian prefers this. I called to schedule and left a voicemail asking them to call back. Carlos [name]

## 2020-09-12 ENCOUNTER — Other Ambulatory Visit (INDEPENDENT_AMBULATORY_CARE_PROVIDER_SITE_OTHER): Payer: Self-pay | Admitting: Family

## 2020-09-12 DIAGNOSIS — G40209 Localization-related (focal) (partial) symptomatic epilepsy and epileptic syndromes with complex partial seizures, not intractable, without status epilepticus: Secondary | ICD-10-CM

## 2020-09-12 DIAGNOSIS — G40319 Generalized idiopathic epilepsy and epileptic syndromes, intractable, without status epilepticus: Secondary | ICD-10-CM

## 2020-09-12 DIAGNOSIS — G40219 Localization-related (focal) (partial) symptomatic epilepsy and epileptic syndromes with complex partial seizures, intractable, without status epilepticus: Secondary | ICD-10-CM

## 2020-09-12 NOTE — Telephone Encounter (Signed)
Called and LVM to please call back to schedule an appt with [name]

## 2020-09-13 NOTE — Telephone Encounter (Signed)
I called Mom and left a message that Carlos [name] needs an appointment in order for me to authorize refills. I asked Mom to call back to schedule. TG

## 2020-09-13 NOTE — Telephone Encounter (Signed)
Please send to the pharmacy °

## 2020-09-14 NOTE — Telephone Encounter (Signed)
I left another message for parents regarding need for follow up appointment. I will mail a letter as well. TG

## 2020-09-28 ENCOUNTER — Other Ambulatory Visit (INDEPENDENT_AMBULATORY_CARE_PROVIDER_SITE_OTHER): Payer: Self-pay | Admitting: Family

## 2020-09-28 ENCOUNTER — Other Ambulatory Visit: Payer: Self-pay | Admitting: Family Medicine

## 2020-09-28 DIAGNOSIS — G40209 Localization-related (focal) (partial) symptomatic epilepsy and epileptic syndromes with complex partial seizures, not intractable, without status epilepticus: Secondary | ICD-10-CM

## 2020-09-28 DIAGNOSIS — G40319 Generalized idiopathic epilepsy and epileptic syndromes, intractable, without status epilepticus: Secondary | ICD-10-CM

## 2020-09-28 DIAGNOSIS — G40219 Localization-related (focal) (partial) symptomatic epilepsy and epileptic syndromes with complex partial seizures, intractable, without status epilepticus: Secondary | ICD-10-CM

## 2020-09-28 NOTE — Telephone Encounter (Signed)
Please send to the pharmacy °

## 2020-10-01 ENCOUNTER — Other Ambulatory Visit (INDEPENDENT_AMBULATORY_CARE_PROVIDER_SITE_OTHER): Payer: Self-pay | Admitting: Family

## 2020-10-01 NOTE — Telephone Encounter (Signed)
Patient has not been seen in awhile, needs to have an office visit prior to refill. Appreciate assistance with this, thank you!

## 2020-10-02 ENCOUNTER — Telehealth: Payer: Self-pay | Admitting: Physician Assistant

## 2020-10-02 ENCOUNTER — Ambulatory Visit (INDEPENDENT_AMBULATORY_CARE_PROVIDER_SITE_OTHER): Admitting: Family

## 2020-10-02 ENCOUNTER — Encounter (INDEPENDENT_AMBULATORY_CARE_PROVIDER_SITE_OTHER): Payer: Self-pay | Admitting: Family

## 2020-10-02 ENCOUNTER — Other Ambulatory Visit: Payer: Self-pay

## 2020-10-02 ENCOUNTER — Ambulatory Visit

## 2020-10-02 VITALS — Wt 91.2 lb

## 2020-10-02 DIAGNOSIS — R1311 Dysphagia, oral phase: Secondary | ICD-10-CM

## 2020-10-02 DIAGNOSIS — G40319 Generalized idiopathic epilepsy and epileptic syndromes, intractable, without status epilepticus: Secondary | ICD-10-CM | POA: Diagnosis not present

## 2020-10-02 DIAGNOSIS — G808 Other cerebral palsy: Secondary | ICD-10-CM

## 2020-10-02 DIAGNOSIS — F819 Developmental disorder of scholastic skills, unspecified: Secondary | ICD-10-CM | POA: Diagnosis not present

## 2020-10-02 DIAGNOSIS — R634 Abnormal weight loss: Secondary | ICD-10-CM

## 2020-10-02 DIAGNOSIS — K5900 Constipation, unspecified: Secondary | ICD-10-CM | POA: Diagnosis not present

## 2020-10-02 DIAGNOSIS — G40219 Localization-related (focal) (partial) symptomatic epilepsy and epileptic syndromes with complex partial seizures, intractable, without status epilepticus: Secondary | ICD-10-CM

## 2020-10-02 DIAGNOSIS — G40209 Localization-related (focal) (partial) symptomatic epilepsy and epileptic syndromes with complex partial seizures, not intractable, without status epilepticus: Secondary | ICD-10-CM

## 2020-10-02 DIAGNOSIS — R4689 Other symptoms and signs involving appearance and behavior: Secondary | ICD-10-CM

## 2020-10-02 MED ORDER — LAMICTAL 200 MG PO TABS: 200.0000 mg | ORAL_TABLET | Freq: Two times a day (BID) | ORAL | 5 refills | Status: DC

## 2020-10-02 MED ORDER — LAMICTAL 25 MG PO TABS: 25.0000 mg | ORAL_TABLET | Freq: Two times a day (BID) | ORAL | 5 refills | Status: DC

## 2020-10-02 MED ORDER — LEVETIRACETAM 750 MG PO TABS: 1500.0000 mg | ORAL_TABLET | Freq: Two times a day (BID) | ORAL | 5 refills | Status: DC

## 2020-10-02 MED ORDER — VIMPAT 100 MG PO TABS: ORAL_TABLET | ORAL | 5 refills | Status: DC

## 2020-10-02 NOTE — Telephone Encounter (Signed)
I connected by phone with Carlos [name] and/or patient's caregiver on 10/02/2020 at 1:42 PM to discuss the potential vaccination through our Homebound vaccination initiative.   Prevaccination Checklist for COVID-19 Vaccines  1.  Are you feeling sick today? no  2.  Have you ever received a dose of a COVID-19 vaccine?  no      If yes, which one? None   3.  Have you ever had an allergic reaction: (This would include a severe reaction [ e.g., anaphylaxis] that required treatment with epinephrine or EpiPen or that caused you to go to the hospital.  It would also include an allergic reaction that occurred within 4 hours that caused hives, swelling, or respiratory distress, including wheezing.) A.  A previous dose of COVID-19 vaccine. no  B.  A vaccine or injectable therapy that contains multiple components, one of which is a COVID-19 vaccine component, but it is not known which component elicited the immediate reaction. no  C.  Are you allergic to polyethylene glycol? no  D. Are you allergic to Polysorbate, which is found in some vaccines, film coated tablets and intravenous steroids?  no   4.  Have you ever had an allergic reaction to another vaccine (other than COVID-19 vaccine) or an injectable medication? (This would include a severe reaction [ e.g., anaphylaxis] that required treatment with epinephrine or EpiPen or that caused you to go to the hospital.  It would also include an allergic reaction that occurred within 4 hours that caused hives, swelling, or respiratory distress, including wheezing.)  no   5.  Have you ever had a severe allergic reaction (e.g., anaphylaxis) to something other than a component of the COVID-19 vaccine, or any vaccine or injectable medication?  This would include food, pet, venom, environmental, or oral medication allergies.  no   6.  Have you received any vaccine in the last 14 days? no   7.  Have you ever had a positive test for COVID-19 or has a doctor ever told  you that you had COVID-19?  no   8.  Have you received passive antibody therapy (monoclonal antibodies or convalescent serum) as a treatment for COVID-19? no   9.  Do you have a weakened immune system caused by something such as HIV infection or cancer or do you take immunosuppressive drugs or therapies?  no   10.  Do you have a bleeding disorder or are you taking a blood thinner? no   11.  Are you pregnant or breast-feeding? no   12.  Do you have dermal fillers? no   __________________   This patient is a 34 y.o. male that meets the FDA criteria to receive homebound vaccination. Patient or parent/caregiver understands they have the option to accept or refuse homebound vaccination.  Patient passed the pre-screening checklist and would like to proceed with homebound vaccination.  Based on questionnaire above, I recommend the patient be observed for 15 minutes.  There are an estimated 1 other household members/caregivers who are also interested in receiving the vaccine. Mother and caregiver.   I will send the patient's information to our scheduling team who will reach out to schedule the patient and potential caregiver/family members for homebound vaccination.   Cline Crock 10/02/2020 1:42 PM
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
## 2020-10-03 ENCOUNTER — Ambulatory Visit: Payer: Medicaid Other

## 2020-10-03 ENCOUNTER — Other Ambulatory Visit: Payer: Self-pay

## 2020-10-03 NOTE — Telephone Encounter (Signed)
Attempted to reach pt or caregiver of pt. To make appt to be seen. No answer. No voicemail set up. Will call later. Aquilla Solian, CMA

## 2020-10-04 MED ORDER — KEPPRA 750 MG PO TABS: ORAL_TABLET | ORAL | 5 refills | Status: DC

## 2020-10-05 ENCOUNTER — Ambulatory Visit: Payer: Medicaid Other

## 2020-10-05 ENCOUNTER — Encounter (INDEPENDENT_AMBULATORY_CARE_PROVIDER_SITE_OTHER): Payer: Self-pay | Admitting: Family

## 2020-10-05 DIAGNOSIS — R634 Abnormal weight loss: Secondary | ICD-10-CM | POA: Insufficient documentation

## 2020-10-05 DIAGNOSIS — R4689 Other symptoms and signs involving appearance and behavior: Secondary | ICD-10-CM | POA: Insufficient documentation

## 2020-10-05 NOTE — Patient Instructions (Addendum)
Thank you for coming in today.   Instructions for you until your next appointment are as follows: 1. Continue giving Carolos's medications as prescribed 2. Let me know if he has any seizures 3. I have referred Kenderick for a Covid vaccine through the [facility] mobile clinic. 4. Please sign up for MyChart if you have not done so 5. Please plan to return for follow up in one year or sooner if needed.

## 2020-10-16 NOTE — Telephone Encounter (Signed)
3rd attempt to reach pt or someone in pts household. No answer. Phone just rang. Unable to leave a message. Aquilla Solian, CMA

## 2020-10-16 NOTE — Telephone Encounter (Signed)
2nd attempt to reach pt. No answer. LVM for pt or someone in pts house hold to call the office to make appt to been seen by Dr. Robyne Peers for follow up and future med refills. Aquilla Solian, CMA

## 2020-10-24 ENCOUNTER — Ambulatory Visit: Payer: Medicaid Other

## 2020-10-26 ENCOUNTER — Ambulatory Visit: Payer: Medicaid Other

## 2020-10-31 ENCOUNTER — Ambulatory Visit: Payer: Medicaid Other | Attending: Internal Medicine

## 2020-10-31 ENCOUNTER — Other Ambulatory Visit: Payer: Self-pay

## 2020-10-31 ENCOUNTER — Ambulatory Visit: Payer: Medicaid Other

## 2020-10-31 DIAGNOSIS — Z23 Encounter for immunization: Secondary | ICD-10-CM

## 2020-10-31 NOTE — Progress Notes (Signed)
   Covid-19 Vaccination Clinic  Name:  KORION CUEVAS    MRN: [id number] DOB: 05-29-1986  10/31/2020  Mr. Belsky was observed post Covid-19 immunization for 15 minutes without incident. He was provided with Vaccine Information Sheet and instruction to access the V-Safe system.   Mr. Cantave was instructed to call 911 with any severe reactions post vaccine: Marland Kitchen Difficulty breathing  . Swelling of face and throat  . A fast heartbeat  . A bad rash all over body  . Dizziness and weakness   Immunizations Administered    Name Date Dose VIS Date Route   Moderna COVID-19 Vaccine 10/31/2020  1:40 PM 0.5 mL 09/05/2020 Intramuscular   Manufacturer: Gala Murdoch   Lot: [id number]   NDC: 53976-734-19

## 2020-12-05 ENCOUNTER — Ambulatory Visit: Payer: Medicaid Other | Attending: Internal Medicine

## 2020-12-05 ENCOUNTER — Other Ambulatory Visit: Payer: Self-pay

## 2020-12-05 DIAGNOSIS — Z23 Encounter for immunization: Secondary | ICD-10-CM

## 2020-12-05 NOTE — Progress Notes (Signed)
   Covid-19 Vaccination Clinic  Name:  Carlos [name]    MRN: [id number] DOB: 17-Apr-1986  12/05/2020  [name] was observed post Covid-19 immunization for 15 minutes without incident. He was provided with Vaccine Information Sheet and instruction to access the V-Safe system.   [name] was instructed to call 911 with any severe reactions post vaccine: Marland Kitchen Difficulty breathing  . Swelling of face and throat  . A fast heartbeat  . A bad rash all over body  . Dizziness and weakness   Immunizations Administered    Name Date Dose VIS Date Route   Moderna COVID-19 Vaccine 12/05/2020 11:45 AM 0.5 mL 09/05/2020 Intramuscular   Manufacturer: Moderna   Lot: [id number]   NDC: 47076-151-83

## 2021-03-08 ENCOUNTER — Telehealth (INDEPENDENT_AMBULATORY_CARE_PROVIDER_SITE_OTHER): Payer: Self-pay | Admitting: Family

## 2021-03-08 DIAGNOSIS — G40219 Localization-related (focal) (partial) symptomatic epilepsy and epileptic syndromes with complex partial seizures, intractable, without status epilepticus: Secondary | ICD-10-CM

## 2021-03-08 DIAGNOSIS — G40209 Localization-related (focal) (partial) symptomatic epilepsy and epileptic syndromes with complex partial seizures, not intractable, without status epilepticus: Secondary | ICD-10-CM

## 2021-03-08 DIAGNOSIS — G40319 Generalized idiopathic epilepsy and epileptic syndromes, intractable, without status epilepticus: Secondary | ICD-10-CM

## 2021-03-08 MED ORDER — VIMPAT 100 MG PO TABS: ORAL_TABLET | ORAL | 5 refills | Status: DC

## 2021-03-08 MED ORDER — KEPPRA 750 MG PO TABS: ORAL_TABLET | ORAL | 5 refills | Status: DC

## 2021-03-08 MED ORDER — LAMICTAL 25 MG PO TABS: 25.0000 mg | ORAL_TABLET | Freq: Two times a day (BID) | ORAL | 5 refills | Status: DC

## 2021-03-08 MED ORDER — LAMICTAL 200 MG PO TABS: 200.0000 mg | ORAL_TABLET | Freq: Two times a day (BID) | ORAL | 5 refills | Status: DC

## 2021-03-08 NOTE — Telephone Encounter (Signed)
Who's calling (name and relationship to patient) : Weston Brass from [employer] on [address]  Best contact number: [phone]  Provider they see: Elveria Rising  Reason for call: Due to insurance they switched pharmacy. Vimpat is needed asap patient is out of this. Rx must say brand name is medically necessary so that insurance will pay for it.  All medications need to be listed as medically necessary Call ID:      PRESCRIPTION REFILL ONLY  Name of prescription: vimpat lamictal 200mg  lamictal 25mg   Pharmacy: [facility] [address]

## 2021-03-08 NOTE — Telephone Encounter (Signed)
Rx's faxed as requested. TG

## 2021-03-24 ENCOUNTER — Encounter (INDEPENDENT_AMBULATORY_CARE_PROVIDER_SITE_OTHER): Payer: Self-pay

## 2021-05-28 ENCOUNTER — Other Ambulatory Visit: Payer: Self-pay

## 2021-05-28 ENCOUNTER — Ambulatory Visit: Attending: Critical Care Medicine

## 2021-05-28 DIAGNOSIS — Z23 Encounter for immunization: Secondary | ICD-10-CM

## 2021-05-28 NOTE — Progress Notes (Signed)
   Covid-19 Vaccination Clinic  Name:  Carlos [name]    MRN: [id number] DOB: [birth year]  05/28/2021  [name] was observed post Covid-19 immunization for 15 minutes without incident. He was provided with Vaccine Information Sheet and instruction to access the V-Safe system.   [name] was instructed to call 911 with any severe reactions post vaccine: Difficulty breathing  Swelling of face and throat  A fast heartbeat  A bad rash all over body  Dizziness and weakness   Immunizations Administered     Name Date Dose VIS Date Route   Moderna Covid-19 Booster Vaccine 05/28/2021  3:23 PM 0.25 mL 09/05/2020 Intramuscular   Manufacturer: Moderna   Lot: [id number]   NDC: 20355-974-16

## 2021-06-17 ENCOUNTER — Other Ambulatory Visit (INDEPENDENT_AMBULATORY_CARE_PROVIDER_SITE_OTHER): Payer: Self-pay | Admitting: Family

## 2021-06-17 DIAGNOSIS — G40209 Localization-related (focal) (partial) symptomatic epilepsy and epileptic syndromes with complex partial seizures, not intractable, without status epilepticus: Secondary | ICD-10-CM

## 2021-06-17 DIAGNOSIS — G40219 Localization-related (focal) (partial) symptomatic epilepsy and epileptic syndromes with complex partial seizures, intractable, without status epilepticus: Secondary | ICD-10-CM

## 2021-06-17 DIAGNOSIS — G40319 Generalized idiopathic epilepsy and epileptic syndromes, intractable, without status epilepticus: Secondary | ICD-10-CM

## 2021-06-18 ENCOUNTER — Encounter (INDEPENDENT_AMBULATORY_CARE_PROVIDER_SITE_OTHER): Payer: Self-pay | Admitting: Family

## 2021-06-18 MED ORDER — LAMICTAL 200 MG PO TABS: 200.0000 mg | ORAL_TABLET | Freq: Two times a day (BID) | ORAL | 1 refills | Status: DC

## 2021-06-18 MED ORDER — VIMPAT 100 MG PO TABS: ORAL_TABLET | ORAL | 1 refills | Status: DC

## 2021-06-18 MED ORDER — LAMICTAL 25 MG PO TABS: 25.0000 mg | ORAL_TABLET | Freq: Two times a day (BID) | ORAL | 1 refills | Status: DC

## 2021-07-15 ENCOUNTER — Telehealth (INDEPENDENT_AMBULATORY_CARE_PROVIDER_SITE_OTHER): Payer: Self-pay | Admitting: Family

## 2021-07-15 DIAGNOSIS — G40319 Generalized idiopathic epilepsy and epileptic syndromes, intractable, without status epilepticus: Secondary | ICD-10-CM

## 2021-07-15 DIAGNOSIS — G40219 Localization-related (focal) (partial) symptomatic epilepsy and epileptic syndromes with complex partial seizures, intractable, without status epilepticus: Secondary | ICD-10-CM

## 2021-07-15 DIAGNOSIS — G40209 Localization-related (focal) (partial) symptomatic epilepsy and epileptic syndromes with complex partial seizures, not intractable, without status epilepticus: Secondary | ICD-10-CM

## 2021-07-15 NOTE — Telephone Encounter (Signed)
  Who's calling (name and relationship to patient) : Carlos [name] - mom  Best contact number: [phone]  Provider they see: Elveria Rising  Reason for call: Needs refills sent to pharmacy.    PRESCRIPTION REFILL ONLY  Name of prescription: KEPPRA 750 MG tablet  LAMICTAL 200 MG tablet  LAMICTAL 25 MG tablet  VIMPAT 100 MG TABS  Pharmacy:  [id number] - [place], [place] - [address]

## 2021-07-16 MED ORDER — KEPPRA 750 MG PO TABS: ORAL_TABLET | ORAL | 0 refills | Status: DC

## 2021-07-16 MED ORDER — LAMICTAL 200 MG PO TABS: 200.0000 mg | ORAL_TABLET | Freq: Two times a day (BID) | ORAL | 0 refills | Status: DC

## 2021-07-16 MED ORDER — VIMPAT 100 MG PO TABS: ORAL_TABLET | ORAL | 0 refills | Status: DC

## 2021-07-16 MED ORDER — LAMICTAL 25 MG PO TABS: 25.0000 mg | ORAL_TABLET | Freq: Two times a day (BID) | ORAL | 0 refills | Status: DC

## 2021-07-16 NOTE — Telephone Encounter (Signed)
Spoke to mom to relay Tina's message "Please let Mom know to check with the pharmacy later today."

## 2021-07-16 NOTE — Telephone Encounter (Signed)
[facility] They do not have Rx's on file for [facility] I called [facility] and they said that [facility] because of problem with [facility]. I will [facility] [address] TG

## 2021-07-24 ENCOUNTER — Ambulatory Visit (INDEPENDENT_AMBULATORY_CARE_PROVIDER_SITE_OTHER): Payer: Medicaid Other | Admitting: Family

## 2021-08-30 ENCOUNTER — Other Ambulatory Visit (INDEPENDENT_AMBULATORY_CARE_PROVIDER_SITE_OTHER): Payer: Self-pay | Admitting: Family

## 2021-08-30 ENCOUNTER — Other Ambulatory Visit: Payer: Self-pay | Admitting: Pediatrics

## 2021-08-30 DIAGNOSIS — G40209 Localization-related (focal) (partial) symptomatic epilepsy and epileptic syndromes with complex partial seizures, not intractable, without status epilepticus: Secondary | ICD-10-CM

## 2021-08-30 DIAGNOSIS — G40219 Localization-related (focal) (partial) symptomatic epilepsy and epileptic syndromes with complex partial seizures, intractable, without status epilepticus: Secondary | ICD-10-CM

## 2021-08-30 DIAGNOSIS — G40319 Generalized idiopathic epilepsy and epileptic syndromes, intractable, without status epilepticus: Secondary | ICD-10-CM

## 2021-09-15 ENCOUNTER — Other Ambulatory Visit (INDEPENDENT_AMBULATORY_CARE_PROVIDER_SITE_OTHER): Payer: Self-pay | Admitting: Family

## 2021-09-15 DIAGNOSIS — G40209 Localization-related (focal) (partial) symptomatic epilepsy and epileptic syndromes with complex partial seizures, not intractable, without status epilepticus: Secondary | ICD-10-CM

## 2021-09-15 DIAGNOSIS — G40219 Localization-related (focal) (partial) symptomatic epilepsy and epileptic syndromes with complex partial seizures, intractable, without status epilepticus: Secondary | ICD-10-CM

## 2021-09-15 DIAGNOSIS — G40319 Generalized idiopathic epilepsy and epileptic syndromes, intractable, without status epilepticus: Secondary | ICD-10-CM

## 2021-09-16 ENCOUNTER — Telehealth (INDEPENDENT_AMBULATORY_CARE_PROVIDER_SITE_OTHER): Payer: Self-pay | Admitting: Family

## 2021-09-16 NOTE — Telephone Encounter (Signed)
  Who's calling (name and relationship to patient) : Rosey Bath - mom  Best contact number: [phone]  Provider they see: Elveria Rising  Reason for call: Mom states that generic for Keppra was sent to pharmacy but patient cannot tolerate the generic and can only have name brand. She requests that we contact the pharmacy and make sure they know to only dispense name brand for patient.    PRESCRIPTION REFILL ONLY  Name of prescription: KEPPRA 750 MG tablet [facility]acy[id number] - [place], [place] - [address]

## 2021-09-16 NOTE — Telephone Encounter (Signed)
This has been done. Please let Mom know + let her know that Ervan needs a follow up appointment. Thanks, [name]

## 2021-09-17 NOTE — Telephone Encounter (Signed)
Not able to reach this patient

## 2021-10-16 ENCOUNTER — Emergency Department (HOSPITAL_COMMUNITY)

## 2021-10-16 ENCOUNTER — Inpatient Hospital Stay (HOSPITAL_COMMUNITY)

## 2021-10-16 ENCOUNTER — Inpatient Hospital Stay (HOSPITAL_COMMUNITY)
Admission: EM | Admit: 2021-10-16 | Discharge: 2021-10-31 | DRG: 100 | Disposition: A | Discharge status: Home / Self Care | Attending: Internal Medicine | Admitting: Internal Medicine

## 2021-10-16 DIAGNOSIS — R4189 Other symptoms and signs involving cognitive functions and awareness: Secondary | ICD-10-CM | POA: Diagnosis present

## 2021-10-16 DIAGNOSIS — E1165 Type 2 diabetes mellitus with hyperglycemia: Secondary | ICD-10-CM | POA: Diagnosis present

## 2021-10-16 DIAGNOSIS — R4182 Altered mental status, unspecified: Secondary | ICD-10-CM

## 2021-10-16 DIAGNOSIS — Z681 Body mass index (BMI) 19 or less, adult: Secondary | ICD-10-CM

## 2021-10-16 DIAGNOSIS — R627 Adult failure to thrive: Secondary | ICD-10-CM | POA: Diagnosis present

## 2021-10-16 DIAGNOSIS — Z79899 Other long term (current) drug therapy: Secondary | ICD-10-CM

## 2021-10-16 DIAGNOSIS — D62 Acute posthemorrhagic anemia: Secondary | ICD-10-CM | POA: Diagnosis not present

## 2021-10-16 DIAGNOSIS — Z20822 Contact with and (suspected) exposure to covid-19: Secondary | ICD-10-CM | POA: Diagnosis present

## 2021-10-16 DIAGNOSIS — R1312 Dysphagia, oropharyngeal phase: Secondary | ICD-10-CM | POA: Diagnosis present

## 2021-10-16 DIAGNOSIS — R933 Abnormal findings on diagnostic imaging of other parts of digestive tract: Secondary | ICD-10-CM

## 2021-10-16 DIAGNOSIS — K2970 Gastritis, unspecified, without bleeding: Secondary | ICD-10-CM | POA: Diagnosis present

## 2021-10-16 DIAGNOSIS — I959 Hypotension, unspecified: Secondary | ICD-10-CM | POA: Diagnosis present

## 2021-10-16 DIAGNOSIS — E870 Hyperosmolality and hypernatremia: Secondary | ICD-10-CM | POA: Diagnosis present

## 2021-10-16 DIAGNOSIS — K5900 Constipation, unspecified: Secondary | ICD-10-CM | POA: Diagnosis present

## 2021-10-16 DIAGNOSIS — G40909 Epilepsy, unspecified, not intractable, without status epilepticus: Secondary | ICD-10-CM | POA: Diagnosis not present

## 2021-10-16 DIAGNOSIS — R64 Cachexia: Secondary | ICD-10-CM | POA: Diagnosis present

## 2021-10-16 DIAGNOSIS — E876 Hypokalemia: Secondary | ICD-10-CM | POA: Diagnosis not present

## 2021-10-16 DIAGNOSIS — G809 Cerebral palsy, unspecified: Secondary | ICD-10-CM | POA: Diagnosis present

## 2021-10-16 DIAGNOSIS — E7220 Disorder of urea cycle metabolism, unspecified: Secondary | ICD-10-CM | POA: Diagnosis present

## 2021-10-16 DIAGNOSIS — E872 Acidosis, unspecified: Secondary | ICD-10-CM | POA: Diagnosis present

## 2021-10-16 DIAGNOSIS — Z9114 Patient's other noncompliance with medication regimen: Secondary | ICD-10-CM

## 2021-10-16 DIAGNOSIS — G40919 Epilepsy, unspecified, intractable, without status epilepticus: Secondary | ICD-10-CM

## 2021-10-16 DIAGNOSIS — Z7722 Contact with and (suspected) exposure to environmental tobacco smoke (acute) (chronic): Secondary | ICD-10-CM | POA: Diagnosis present

## 2021-10-16 DIAGNOSIS — K649 Unspecified hemorrhoids: Secondary | ICD-10-CM | POA: Diagnosis present

## 2021-10-16 DIAGNOSIS — E860 Dehydration: Secondary | ICD-10-CM | POA: Diagnosis present

## 2021-10-16 DIAGNOSIS — N179 Acute kidney failure, unspecified: Secondary | ICD-10-CM | POA: Diagnosis present

## 2021-10-16 DIAGNOSIS — R569 Unspecified convulsions: Secondary | ICD-10-CM

## 2021-10-16 DIAGNOSIS — Q040 Congenital malformations of corpus callosum: Secondary | ICD-10-CM

## 2021-10-16 DIAGNOSIS — K921 Melena: Secondary | ICD-10-CM | POA: Diagnosis present

## 2021-10-16 DIAGNOSIS — K209 Esophagitis, unspecified without bleeding: Secondary | ICD-10-CM | POA: Diagnosis present

## 2021-10-16 DIAGNOSIS — R5381 Other malaise: Secondary | ICD-10-CM | POA: Diagnosis present

## 2021-10-16 DIAGNOSIS — Z7401 Bed confinement status: Secondary | ICD-10-CM

## 2021-10-16 DIAGNOSIS — E43 Unspecified severe protein-calorie malnutrition: Secondary | ICD-10-CM | POA: Diagnosis present

## 2021-10-16 DIAGNOSIS — G40209 Localization-related (focal) (partial) symptomatic epilepsy and epileptic syndromes with complex partial seizures, not intractable, without status epilepticus: Principal | ICD-10-CM | POA: Diagnosis present

## 2021-10-16 DIAGNOSIS — K2990 Gastroduodenitis, unspecified, without bleeding: Secondary | ICD-10-CM | POA: Diagnosis present

## 2021-10-16 DIAGNOSIS — K859 Acute pancreatitis without necrosis or infection, unspecified: Secondary | ICD-10-CM

## 2021-10-16 DIAGNOSIS — R339 Retention of urine, unspecified: Secondary | ICD-10-CM | POA: Diagnosis present

## 2021-10-16 DIAGNOSIS — G9341 Metabolic encephalopathy: Secondary | ICD-10-CM | POA: Diagnosis present

## 2021-10-16 DIAGNOSIS — K625 Hemorrhage of anus and rectum: Secondary | ICD-10-CM | POA: Diagnosis not present

## 2021-10-16 LAB — CBC WITH DIFFERENTIAL/PLATELET
Abs Immature Granulocytes: 0.11 10*3/uL — ABNORMAL HIGH (ref 0.00–0.07)
Basophils Absolute: 0 10*3/uL (ref 0.0–0.1)
Basophils Relative: 0 %
Eosinophils Absolute: 0.1 10*3/uL (ref 0.0–0.5)
Eosinophils Relative: 0 %
HCT: 48.1 % (ref 39.0–52.0)
Hemoglobin: 13.5 g/dL (ref 13.0–17.0)
Immature Granulocytes: 1 %
Lymphocytes Relative: 12 %
Lymphs Abs: 1.6 10*3/uL (ref 0.7–4.0)
MCH: 24.6 pg — ABNORMAL LOW (ref 26.0–34.0)
MCHC: 28.1 g/dL — ABNORMAL LOW (ref 30.0–36.0)
MCV: 87.8 fL (ref 80.0–100.0)
Monocytes Absolute: 0.8 10*3/uL (ref 0.1–1.0)
Monocytes Relative: 6 %
Neutro Abs: 10.3 10*3/uL — ABNORMAL HIGH (ref 1.7–7.7)
Neutrophils Relative %: 81 %
Platelets: 100 10*3/uL — ABNORMAL LOW (ref 150–400)
RBC: 5.48 MIL/uL (ref 4.22–5.81)
RDW: 13.9 % (ref 11.5–15.5)
Smear Review: NORMAL
WBC: 12.8 10*3/uL — ABNORMAL HIGH (ref 4.0–10.5)
nRBC: 0.4 % — ABNORMAL HIGH (ref 0.0–0.2)

## 2021-10-16 LAB — CBG MONITORING, ED: Glucose-Capillary: 183 mg/dL — ABNORMAL HIGH (ref 70–99)

## 2021-10-16 LAB — I-STAT VENOUS BLOOD GAS, ED
Acid-base deficit: 2 mmol/L (ref 0.0–2.0)
Bicarbonate: 21.1 mmol/L (ref 20.0–28.0)
Calcium, Ion: 0.95 mmol/L — ABNORMAL LOW (ref 1.15–1.40)
HCT: 43 % (ref 39.0–52.0)
Hemoglobin: 14.6 g/dL (ref 13.0–17.0)
O2 Saturation: 91 %
Potassium: 4.1 mmol/L (ref 3.5–5.1)
Sodium: 160 mmol/L — ABNORMAL HIGH (ref 135–145)
TCO2: 22 mmol/L (ref 22–32)
pCO2, Ven: 30.1 mmHg — ABNORMAL LOW (ref 44.0–60.0)
pH, Ven: 7.454 — ABNORMAL HIGH (ref 7.250–7.430)
pO2, Ven: 57 mmHg — ABNORMAL HIGH (ref 32.0–45.0)

## 2021-10-16 LAB — I-STAT CHEM 8, ED
BUN: 112 mg/dL — ABNORMAL HIGH (ref 6–20)
Calcium, Ion: 0.91 mmol/L — ABNORMAL LOW (ref 1.15–1.40)
Chloride: 129 mmol/L — ABNORMAL HIGH (ref 98–111)
Creatinine, Ser: 2.7 mg/dL — ABNORMAL HIGH (ref 0.61–1.24)
Glucose, Bld: 229 mg/dL — ABNORMAL HIGH (ref 70–99)
HCT: 43 % (ref 39.0–52.0)
Hemoglobin: 14.6 g/dL (ref 13.0–17.0)
Potassium: 4 mmol/L (ref 3.5–5.1)
Sodium: 159 mmol/L — ABNORMAL HIGH (ref 135–145)
TCO2: 21 mmol/L — ABNORMAL LOW (ref 22–32)

## 2021-10-16 LAB — COMPREHENSIVE METABOLIC PANEL
ALT: 13 U/L (ref 0–44)
AST: 36 U/L (ref 15–41)
Albumin: 3 g/dL — ABNORMAL LOW (ref 3.5–5.0)
Alkaline Phosphatase: 44 U/L (ref 38–126)
Anion gap: 14 (ref 5–15)
BUN: 113 mg/dL — ABNORMAL HIGH (ref 6–20)
CO2: 22 mmol/L (ref 22–32)
Calcium: 8.5 mg/dL — ABNORMAL LOW (ref 8.9–10.3)
Chloride: 123 mmol/L — ABNORMAL HIGH (ref 98–111)
Creatinine, Ser: 2.72 mg/dL — ABNORMAL HIGH (ref 0.61–1.24)
GFR, Estimated: 30 mL/min — ABNORMAL LOW (ref >60)
Glucose, Bld: 238 mg/dL — ABNORMAL HIGH (ref 70–99)
Potassium: 4 mmol/L (ref 3.5–5.1)
Sodium: 159 mmol/L — ABNORMAL HIGH (ref 135–145)
Total Bilirubin: 0.6 mg/dL (ref 0.3–1.2)
Total Protein: 7.3 g/dL (ref 6.5–8.1)

## 2021-10-16 LAB — RESP PANEL BY RT-PCR (FLU A&B, COVID) ARPGX2
Influenza A by PCR: NEGATIVE
Influenza B by PCR: NEGATIVE
SARS Coronavirus 2 by RT PCR: NEGATIVE

## 2021-10-16 LAB — AMMONIA: Ammonia: 46 umol/L — ABNORMAL HIGH (ref 9–35)

## 2021-10-16 LAB — APTT: aPTT: 30 seconds (ref 24–36)

## 2021-10-16 LAB — LACTIC ACID, PLASMA
Lactic Acid, Venous: 5.1 mmol/L (ref 0.5–1.9)
Lactic Acid, Venous: 4.1 mmol/L (ref 0.5–1.9)

## 2021-10-16 LAB — PROTIME-INR
INR: 1.3 — ABNORMAL HIGH (ref 0.8–1.2)
Prothrombin Time: 16.2 seconds — ABNORMAL HIGH (ref 11.4–15.2)

## 2021-10-16 LAB — HIV ANTIBODY (ROUTINE TESTING W REFLEX): HIV Screen 4th Generation wRfx: NONREACTIVE

## 2021-10-16 LAB — LIPASE, BLOOD: Lipase: 186 U/L — ABNORMAL HIGH (ref 11–51)

## 2021-10-16 MED ORDER — LACTULOSE ENEMA
300.0000 mL | Freq: Every day | ORAL | Status: AC
  Administered 2021-10-17: 300 mL via RECTAL
  Filled 2021-10-16 (×3): qty 300

## 2021-10-16 MED ORDER — CHLORHEXIDINE GLUCONATE 0.12% ORAL RINSE (MEDLINE KIT)
15.0000 mL | Freq: Two times a day (BID) | OROMUCOSAL | Status: DC
  Administered 2021-10-16 – 2021-10-31 (×29): 15 mL via OROMUCOSAL

## 2021-10-16 MED ORDER — PANTOPRAZOLE SODIUM 40 MG IV SOLR
40.0000 mg | INTRAVENOUS | Status: DC
  Administered 2021-10-16 – 2021-10-17 (×2): 40 mg via INTRAVENOUS
  Filled 2021-10-16 (×3): qty 40

## 2021-10-16 MED ORDER — SODIUM CHLORIDE 0.9 % IV SOLN: INTRAVENOUS | Status: DC

## 2021-10-16 MED ORDER — LEVETIRACETAM 500 MG/5ML IV SOLN
750.0000 mg | Freq: Two times a day (BID) | INTRAVENOUS | Status: DC
  Administered 2021-10-17 – 2021-10-18 (×3): 750 mg via INTRAVENOUS
  Filled 2021-10-16 (×7): qty 7.5

## 2021-10-16 MED ORDER — LACTATED RINGERS IV BOLUS (SEPSIS): 1000.0000 mL | Freq: Once | INTRAVENOUS | Status: DC

## 2021-10-16 MED ORDER — SODIUM CHLORIDE 0.9 % IV SOLN: 2.0000 g | Freq: Once | INTRAVENOUS | Status: DC

## 2021-10-16 MED ORDER — INSULIN ASPART 100 UNIT/ML IJ SOLN
0.0000 [IU] | Freq: Three times a day (TID) | INTRAMUSCULAR | Status: DC
  Administered 2021-10-21: 1 [IU] via SUBCUTANEOUS
  Administered 2021-10-21: 0 [IU] via SUBCUTANEOUS
  Administered 2021-10-22 – 2021-10-26 (×4): 1 [IU] via SUBCUTANEOUS
  Administered 2021-10-28: 2 [IU] via SUBCUTANEOUS
  Administered 2021-10-29 – 2021-10-31 (×2): 1 [IU] via SUBCUTANEOUS

## 2021-10-16 MED ORDER — SODIUM CHLORIDE 0.9 % IV SOLN: 750.0000 mg | Freq: Two times a day (BID) | INTRAVENOUS | Status: DC

## 2021-10-16 MED ORDER — LACTATED RINGERS IV BOLUS
2000.0000 mL | Freq: Once | INTRAVENOUS | Status: AC
  Administered 2021-10-16: 2000 mL via INTRAVENOUS

## 2021-10-16 MED ORDER — SODIUM CHLORIDE 0.9 % IV SOLN
1.0000 g | INTRAVENOUS | Status: DC
  Administered 2021-10-16: 1 g via INTRAVENOUS
  Filled 2021-10-16 (×2): qty 10

## 2021-10-16 MED ORDER — VANCOMYCIN HCL 1000 MG/200ML IV SOLN
1000.0000 mg | Freq: Once | INTRAVENOUS | Status: AC
  Administered 2021-10-16: 1000 mg via INTRAVENOUS
  Filled 2021-10-16: qty 200

## 2021-10-16 MED ORDER — SODIUM CHLORIDE 0.9 % IV SOLN
2.0000 g | INTRAVENOUS | Status: DC
  Administered 2021-10-16: 2 g via INTRAVENOUS
  Filled 2021-10-16: qty 2

## 2021-10-16 MED ORDER — ORAL CARE MOUTH RINSE
15.0000 mL | OROMUCOSAL | Status: DC
  Administered 2021-10-16 – 2021-10-31 (×121): 15 mL via OROMUCOSAL

## 2021-10-16 MED ORDER — SODIUM CHLORIDE 0.9 % IV SOLN
150.0000 mg | Freq: Two times a day (BID) | INTRAVENOUS | Status: DC
  Filled 2021-10-16: qty 15

## 2021-10-16 MED ORDER — LACTATED RINGERS IV SOLN: INTRAVENOUS | Status: DC

## 2021-10-16 MED ORDER — LEVETIRACETAM IN NACL 1500 MG/100ML IV SOLN
1500.0000 mg | Freq: Two times a day (BID) | INTRAVENOUS | Status: DC
  Filled 2021-10-16: qty 100

## 2021-10-16 MED ORDER — SODIUM CHLORIDE 0.9 % IV SOLN: 225.0000 mg | Freq: Two times a day (BID) | INTRAVENOUS | Status: DC

## 2021-10-16 MED ORDER — HEPARIN SODIUM (PORCINE) 5000 UNIT/ML IJ SOLN
5000.0000 [IU] | Freq: Three times a day (TID) | INTRAMUSCULAR | Status: DC
  Administered 2021-10-16 – 2021-10-18 (×5): 5000 [IU] via SUBCUTANEOUS
  Filled 2021-10-16 (×5): qty 1

## 2021-10-16 MED ORDER — METRONIDAZOLE 500 MG/100ML IV SOLN
500.0000 mg | Freq: Once | INTRAVENOUS | Status: AC
  Administered 2021-10-16: 500 mg via INTRAVENOUS
  Filled 2021-10-16: qty 100

## 2021-10-16 MED ORDER — VANCOMYCIN VARIABLE DOSE PER UNSTABLE RENAL FUNCTION (PHARMACIST DOSING): Status: DC

## 2021-10-16 MED ORDER — SODIUM CHLORIDE 0.9 % IV SOLN
200.0000 mg | Freq: Two times a day (BID) | INTRAVENOUS | Status: DC
  Administered 2021-10-16 – 2021-10-18 (×4): 200 mg via INTRAVENOUS
  Filled 2021-10-16 (×5): qty 20

## 2021-10-16 MED ORDER — LORAZEPAM 2 MG/ML IJ SOLN: 4.0000 mg | INTRAMUSCULAR | Status: DC | PRN

## 2021-10-16 MED ORDER — LORAZEPAM 2 MG/ML IJ SOLN
2.0000 mg | Freq: Once | INTRAMUSCULAR | Status: AC
  Administered 2021-10-16: 2 mg via INTRAVENOUS
  Filled 2021-10-16: qty 1

## 2021-10-16 MED ORDER — SODIUM CHLORIDE 0.45 % IV SOLN: INTRAVENOUS | Status: AC

## 2021-10-16 MED ORDER — LEVETIRACETAM IN NACL 1500 MG/100ML IV SOLN
1500.0000 mg | INTRAVENOUS | Status: AC
  Administered 2021-10-16: 1500 mg via INTRAVENOUS
  Filled 2021-10-16: qty 100

## 2021-10-16 MED ORDER — ONDANSETRON HCL 4 MG/2ML IJ SOLN: 4.0000 mg | Freq: Four times a day (QID) | INTRAMUSCULAR | Status: DC | PRN

## 2021-10-16 MED ORDER — SODIUM CHLORIDE 0.9 % IV SOLN: 75.0000 mL/h | INTRAVENOUS | Status: DC

## 2021-10-16 MED ORDER — ONDANSETRON HCL 4 MG PO TABS: 4.0000 mg | ORAL_TABLET | Freq: Four times a day (QID) | ORAL | Status: DC | PRN

## 2021-10-16 NOTE — Consult Note (Addendum)
Neurology Consultation  Reason for Consult: Altered mental status, history of seizures Referring Physician: Dr. Wynetta Fines  CC: Altered mental status, history of seizures-now with multiple breakthrough seizures  History is obtained from: Chart  HPI: Carlos [name] is a 35 y.o. 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  History was obtained from the chart.  Patient's family was not at the bedside during this encounter.  According to the chart review, patient's family has been the primary caregiver and father who is his primary caregiver was out of town for a week and when he came back, he reported that the mother was taking care of the patient reported that patient was having excessively low p.o. intake and was spitting up food and water which is unusual for him.  Yesterday whole day he was very sleepy, had 1 seizure that lasted few minutes and stop by itself.  Father reports that the patient was getting medications as being given by the mother. This morning he had another seizure and remained less responsive after the seizure.  At baseline patient would respond with head and eye movement to speech but was unable to do that this morning.  He also reported about a 15 pound weight loss over the past 12 months. In the emergency room, he was found to be hypotensive, tachycardic with low-grade fever in the 99.4 Fahrenheit range, he was not hypoxic, chest x-ray was clear, head CT with no acute findings.  Blood work showed hyper natremia with sodium of 159, creatinine 2.7, BUN 113 and elevated lactate of 5. Patient was given a load of Keppra 1500 mg x 1 in the emergency room and neurological consultation was obtained for further  management.   ROS: Unable to obtain due to altered mental status.   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)     Family History  Problem Relation Age of Onset   Cancer Paternal Grandfather        Died at 66    Social History:   reports that he is a non-smoker but has been exposed to tobacco smoke. He has never used smokeless tobacco. He reports that he does not drink alcohol and does not use drugs.  Medications  Current Facility-Administered Medications:    0.45 % sodium chloride infusion, , Intravenous, Continuous, Zhang, Pearletha Forge T, MD   cefTRIAXone (ROCEPHIN) 1 g in sodium chloride 0.9 % 100 mL IVPB, 1 g, Intravenous, Q24H, Zhang, Ralene Cork, MD   chlorhexidine gluconate (MEDLINE KIT) (PERIDEX) 0.12 % solution 15 mL, 15 mL, Mouth Rinse, BID, Zhang, Ping T, MD   heparin injection 5,000 Units, 5,000 Units, Subcutaneous, Q8H, Zhang, Ping T, MD   Derrill Memo ON 10/17/2021] insulin aspart (novoLOG) injection 0-9 Units, 0-9 Units, Subcutaneous, TID WC, Zhang, Ping T, MD   lacosamide (VIMPAT) 150 mg in sodium chloride 0.9 % 25 mL IVPB, 150 mg, Intravenous, Q12H, Lequita Halt, MD   [START ON 10/17/2021] lactulose [facility])  enema 200 gm, 300 mL, Rectal, Daily, Roosevelt Locks, Ralene Cork, MD   [START ON 10/17/2021] levETIRAcetam (KEPPRA) IVPB 1500 mg/ 100 mL premix, 1,500 mg, Intravenous, Q12H, Zhang, Ralene Cork, MD   LORazepam (ATIVAN) injection 4 mg, 4 mg, Intravenous, Q5 Min x 2 PRN, Wynetta Fines T, MD   MEDLINE mouth rinse, 15 mL, Mouth Rinse, 10 times per day, Wynetta Fines T, MD   ondansetron [facility]) tablet 4 mg, 4 mg, Oral, Q6H PRN **OR** ondansetron (ZOFRAN) injection 4 mg, 4 mg, Intravenous, Q6H PRN, Wynetta Fines T, MD   pantoprazole (PROTONIX) injection 40 mg, 40 mg, Intravenous, Q24H, Zhang, Ralene Cork, MD  Current Outpatient Medications:    cetirizine HCl (ZYRTEC) 5 MG/5ML SOLN, Take 10 mLs (10 mg total) by mouth daily as needed for itching., Disp: 300 mL, Rfl: 0   KEPPRA 750 MG tablet, Take 2 tablets (1,500 mg total) by mouth 2 (two) times daily., Disp: 120 tablet, Rfl: 0   lactulose (CHRONULAC) 10 GM/15ML solution, Give 15ml by mouth twice per day (Patient taking differently: Take 10 g by mouth 2 (two) times daily as needed for mild constipation. ), Disp: 473 mL, Rfl: 5   LAMICTAL 200 MG tablet, TAKE 1 TABLET BY MOUTH TWICE A DAY, Disp: 60 tablet, Rfl: 0   LAMICTAL 25 MG tablet, TAKE 1 TABLET BY MOUTH TWICE A DAY, Disp: 60 tablet, Rfl: 0   polyethylene glycol (MIRALAX / GLYCOLAX) 17 g packet, Take 17 g by mouth daily as needed for mild constipation., Disp: , Rfl:    sodium phosphate Pediatric (FLEET) 3.5-9.5 GM/59ML enema, Place 1 enema rectally every three (3) days as needed for constipation., Disp: , Rfl:    triamcinolone cream (KENALOG) 0.1 %, Apply small amount to rash twice per day (Patient taking differently: Apply 1 application topically 2 (two) times daily. ), Disp: 80 g, Rfl: 5   VIMPAT 100 MG TABS, TAKE 1 TABLET EVERY DAY IN THE MORNING TAKE 2 TABLETS IN THE EVENING, Disp: 90 tablet, Rfl: 0  Exam: Current vital signs: BP 111/87   Pulse 94   Temp 99.4 F (37.4 C) (Oral)   Resp 17   SpO2 100%  Vital signs in last 24 hours: Temp:  [97.9 F (36.6 C)-99.4 F (37.4 C)] 99.4 F (37.4 C) (11/30 1510) Pulse Rate:  [53-129] 94 (11/30 1845) Resp:  [10-22] 17 (11/30 1930) BP: (55-121)/(30-102) 111/87 (11/30 1930) SpO2:  [93 %-100 %] 100 % (11/30 1845)  General: Laying in bed, with eyes closed HEENT: Normocephalic/atraumatic CVS: Mildly tachycardic Respiratory: Breathing well saturating normally on room air and protecting his airway Abdomen: Nondistended Extremities: Warm, well  perfused, contractures noted in all fours Neurological exam Laying in bed with eyes closed To voice, opens eyes and attempts to track the examiner but unclear how purposeful that is Mildly disconjugate gaze Intact oculocephalics Intact cough and gag Increased tone in all 4 extremities with diminished bulk Withdraws or attempts to withdraw in all 4 without focality Coordination cannot be assessed due to his mentation and inability to follow commands. Gait testing cannot also be tested due to his poor mentation.  Labs I have reviewed labs in epic and the results pertinent to this consultation are:  CBC    Component Value Date/Time   WBC 12.8 (H) 10/16/2021 1456   RBC 5.48 10/16/2021 1456   HGB 14.6 10/16/2021 1504   HGB 14.6 10/16/2021 1504   HCT 43.0 10/16/2021 1504   HCT 43.0 10/16/2021 1504   PLT  100 (L) 10/16/2021 1456   MCV 87.8 10/16/2021 1456   MCH 24.6 (L) 10/16/2021 1456   MCHC 28.1 (L) 10/16/2021 1456   RDW 13.9 10/16/2021 1456   LYMPHSABS 1.6 10/16/2021 1456   MONOABS 0.8 10/16/2021 1456   EOSABS 0.1 10/16/2021 1456   BASOSABS 0.0 10/16/2021 1456   CMP     Component Value Date/Time   NA 159 (H) 10/16/2021 1504   NA 160 (H) 10/16/2021 1504   K 4.0 10/16/2021 1504   K 4.1 10/16/2021 1504   CL 129 (H) 10/16/2021 1504   CO2 22 10/16/2021 1456   GLUCOSE 229 (H) 10/16/2021 1504   BUN 112 (H) 10/16/2021 1504   CREATININE 2.70 (H) 10/16/2021 1504   CREATININE 0.88 04/06/2017 1045   CALCIUM 8.5 (L) 10/16/2021 1456   PROT 7.3 10/16/2021 1456   ALBUMIN 3.0 (L) 10/16/2021 1456   AST 36 10/16/2021 1456   ALT 13 10/16/2021 1456   ALKPHOS 44 10/16/2021 1456   BILITOT 0.6 10/16/2021 1456   GFRNONAA 30 (L) 10/16/2021 1456   GFRAA >60 07/09/2020 0210   Imaging I have reviewed the images obtained:  CT-head-no acute intracranial abnormalities, stable findings of corpus callosum dysgenesis and colpocephaly  CT of the abdomen and pelvis without contrast cannot exclude  mild diffuse peripancreatic inflammation, no obvious fluid collections, distal esophagus wall thickening concerning for esophagitis, stomach distended with air-fluid level, large stool burden.  Chest x-ray-no acute chest findings.  Stool visualized in portions of colon indicative of constipation.  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  Unclear if he has been compliant to medications due to family being out of town and family member taking care of patient not being the primary caregiver per chart review. Also noted to be mildly febrile, hypernatremic, lactic acidosis along with large stool burden in his abdomen. Likely breakthrough seizures n the setting of metabolic abnormalities.  Will need evaluation for status epilepticus  Recommendations: -Continue home antiepileptics: Keppra 750 twice daily given deranged renal function, Vimpat 200 mg twice daily-both of these can be given IV until his p.o. intake is established. -Lamictal unfortunately cannot be given parenterally.  Okay to hold.  Check level. -Stat EEG (ordered and EEG technologist notified) -If EEG shows evidence of seizures, will add third antiepileptic agent that can be given parenterally. -Aggressive management of constipation and toxic/metabolic derangements including hypernatremia and lactic acidosis including search for a cause for this.  Low suspicion for CNS infection at this time-do not need antibiotic coverage for meningitic or encephalitic etiology. -Urinalysis -Avoid cefepime as it can cause neurotoxicity.  Plan discussed with Dr. Roosevelt Locks over secure chat.  Neurology will follow with you.  -- Amie Portland,  MD Neurologist [facility] Pager: [phone]

## 2021-10-16 NOTE — ED Notes (Signed)
Patient transported to CT 

## 2021-10-16 NOTE — ED Triage Notes (Signed)
BIB POV, per father pt increasingly becoming malnourished. Father also endorses that pt became less responsive today after giving pt a bath. Pt quadriplegic at baseline.

## 2021-10-16 NOTE — Progress Notes (Signed)
STAT EEG complete - results pending. ? ?

## 2021-10-16 NOTE — Procedures (Signed)
Patient Name: Carlos [name]  MRN: [id number]  Epilepsy Attending: Charlsie Quest  Referring Physician/Provider: Dr Milon Dikes Date: 10/16/2021 Duration: 31.01 mins  Patient history:  36 y.o. 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 EEG to evaluate for seizure.   Level of alertness:  lethargic   AEDs during EEG study: LEV, LCM  Technical aspects: This EEG study was done with scalp electrodes positioned according to the 10-20 International system of electrode placement. Electrical activity was acquired at a sampling rate of 500Hz  and reviewed with a high frequency filter of 70Hz  and a low frequency filter of 1Hz . EEG data were recorded continuously and digitally stored.   Description: EEG showed continuous generalized low amplitude , at times sharply contoured, 3 to 6 Hz theta-delta slowing. Hyperventilation and photic stimulation were not performed.     ABNORMALITY - Continuous slow, generalized  IMPRESSION: This study is suggestive of severe diffuse encephalopathy, nonspecific etiology. No seizures or definite epileptiform discharges were seen throughout the recording.  Dr was notified.  Marrio Scribner 

## 2021-10-16 NOTE — ED Notes (Signed)
PA at bedside.

## 2021-10-16 NOTE — Progress Notes (Signed)
Pharmacy Antibiotic Note  Carlos [name] is a 35 y.o. male admitted on 10/16/2021 with sepsis.  Pharmacy has been consulted for vancomycin and cefepime dosing.  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  Patient's serum creatinine is 2.7 which is significantly above baseline. WBC 12.8  Plan: Flagyl per MD Cefepime 2 g q24hr Vancomycin 1000 mg once, subsequent dosing as indicated per random vancomycin level until renal function stable and/or improved, at which time scheduled dosing can be considered Trend WBC, fever, renal function, and clinical course Follow-up cultures and de-escalate antibiotics as appropriate.     Temp (24hrs), Avg:98.7 F (37.1 C), Min:97.9 F (36.6 C), Max:99.4 F (37.4 C)  Recent Labs  Lab 10/16/21 1456 10/16/21 1504  WBC 12.8*  --   CREATININE  --  2.70*    CrCl cannot be calculated (Unknown ideal weight.).    No Known Allergies  Antimicrobials this admission: cefepime 11/30 >>  flagyl 11/30 >>  vancomycin 11/30 >>  Microbiology results: Pending  Thank you for allowing pharmacy to be a part of this patient's care.  Delmar Landau, PharmD, BCPS 10/16/2021 3:49 PM ED Clinical Pharmacist -  [phone]

## 2021-10-16 NOTE — ED Notes (Signed)
[place] MD at bedside 

## 2021-10-16 NOTE — H&P (Addendum)
History and Physical    Carlos [name] KGM:[id number] DOB: May 10, 1986 DOA: 10/16/2021  PCP: Pcp, No (Confirm with patient/family/NH records and if not entered, this has to be entered at [facility] point of entry) Patient coming from: Home  I have personally briefly reviewed patient's old medical records in [facility]  Chief Complaint: Patient nonverbal  HPI: Carlos [name] is a 35 y.o. 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  Patient unable to provide any history, all history provided by patient's mother at bedside.  Patient palpable reported that he himself has been the main caregiver at home, with patient mother help.  Patient's father went out of town for the last 7 days and came back yesterday.  Patient has had poor oral intake for at least 3 days, reportedly from the mother, patient spit up food and water which is unusual for him.  Yesterday whole day, patient was very sleepy, and had 1 episode of grand mal seizure lasted about few minutes and stopped by itself.  Father reported that he was able to feed the patient over the seizure medications yesterday evening and this morning.  Despite, this morning patient had another episode of grand mal seizures and remained nonresponsive afterwards.  At baseline patient would respond with head and eye movement to speech.  Father reported the patient had about 15 pounds of weight loss this year, but was never offered feeding tube before.  ED Course: He was found hypotensive and tachycardia, low-grade fever 99.4, no hypoxia, chest x-ray no acute infiltrates.  Head CT negative for acute findings.  Blood work showed severe hyponatremia NA 159, AKI creatinine 2.7, anemia BUN 113  And patient was loaded with Keppra 1500 mg x1 in the ED.  Review of Systems: Unable to perform, baseline nonverbal  Past Medical History:  Diagnosis Date    Agenesis of corpus callosum (HCC)    Cerebral palsy (HCC)    Folliculitis barbae 07/10/2011   Groin abscess 03/02/2008   Left hip pain 02/05/2016   Pneumonia    Seizures (HCC)    Status epilepticus (HCC)    Swelling of joint of pelvic region or thigh 02/05/2016   Ventriculomegaly of brain, congenital [facility])     Past Surgical History:  Procedure Laterality Date   STRABISMUS SURGERY     in [place]     reports that he is a non-smoker but has been exposed to tobacco smoke. He has never used smokeless tobacco. He reports that he does not drink alcohol and does not use drugs.  No Known Allergies  Family History  Problem Relation Age of Onset   Cancer Paternal Grandfather        Died at 54     Prior to Admission medications   Medication Sig Start Date End Date Taking? Authorizing Provider  cetirizine HCl (ZYRTEC) 5 MG/5ML SOLN Take 10 mLs (10 mg total) by mouth daily as needed for itching. 08/18/17   Arvilla Market, MD  KEPPRA 750 MG tablet Take 2 tablets (1,500 mg total) by mouth 2 (two) times daily. 09/16/21   Elveria Rising, NP  lactulose (CHRONULAC) 10 GM/15ML solution Give 40ml by mouth twice per day Patient taking differently: Take 10 g by mouth 2 (two) times daily as needed for mild constipation.  07/12/19   Elveria Rising, NP  LAMICTAL 200 MG tablet TAKE 1 TABLET BY MOUTH TWICE A DAY 09/16/21   Goodpasture,  [name], NP  LAMICTAL 25 MG tablet TAKE 1 TABLET BY MOUTH TWICE A DAY 09/16/21   Elveria Rising, NP  polyethylene glycol (MIRALAX / GLYCOLAX) 17 g packet Take 17 g by mouth daily as needed for mild constipation.    [provider]  sodium phosphate Pediatric (FLEET) 3.5-9.5 GM/59ML enema Place 1 enema rectally every three (3) days as needed for constipation.    [provider]  triamcinolone cream (KENALOG) 0.1 % Apply small amount to rash twice per day Patient taking differently: Apply 1 application topically 2 (two) times daily.  06/30/18    Elveria Rising, NP  VIMPAT 100 MG TABS TAKE 1 TABLET EVERY DAY IN THE MORNING TAKE 2 TABLETS IN THE EVENING 09/16/21   Elveria Rising, NP    Physical Exam: Vitals:   10/16/21 1800 10/16/21 1815 10/16/21 1830 10/16/21 1845  BP: 103/86 119/82 121/79 116/78  Pulse: (!) 102   94  Resp: 11 10 12 12   Temp:      TempSrc:      SpO2: 100%   100%    Constitutional: NAD, calm, comfortable Vitals:   10/16/21 1800 10/16/21 1815 10/16/21 1830 10/16/21 1845  BP: 103/86 119/82 121/79 116/78  Pulse: (!) 102   94  Resp: 11 10 12 12   Temp:      TempSrc:      SpO2: 100%   100%   Eyes: PERRL, lids and conjunctivae normal ENMT: Mucous membranes are dry. Posterior pharynx clear of any exudate or lesions.Normal dentition.  Neck: normal, supple, no masses, no thyromegaly Respiratory: clear to auscultation bilaterally, no wheezing, no crackles. Normal respiratory effort. No accessory muscle use.  Cardiovascular: Regular rate and rhythm, no murmurs / rubs / gallops. No extremity edema. 2+ pedal pulses. No carotid bruits.  Abdomen: no tenderness, no masses palpated. No hepatosplenomegaly. Bowel sounds positive.  Musculoskeletal: no clubbing / cyanosis. No joint deformity upper and lower extremities. Good ROM, no contractures. Normal muscle tone.  Skin: no rashes, lesions, ulcers. No induration Neurologic: Limbs contracted, moving neck and head to painful stimuli Psychiatric: Unresponsive    Labs on Admission: I have personally reviewed following labs and imaging studies  CBC: Recent Labs  Lab 10/16/21 1456 10/16/21 1504  WBC 12.8*  --   NEUTROABS 10.3*  --   HGB 13.5 14.6  14.6  HCT 48.1 43.0  43.0  MCV 87.8  --   PLT 100*  --    Basic Metabolic Panel: Recent Labs  Lab 10/16/21 1456 10/16/21 1504  NA 159* 160*  159*  K 4.0 4.1  4.0  CL 123* 129*  CO2 22  --   GLUCOSE 238* 229*  BUN 113* 112*  CREATININE 2.72* 2.70*  CALCIUM 8.5*  --    GFR: CrCl cannot be calculated  (Unknown ideal weight.). Liver Function Tests: Recent Labs  Lab 10/16/21 1456  AST 36  ALT 13  ALKPHOS 44  BILITOT 0.6  PROT 7.3  ALBUMIN 3.0*   Recent Labs  Lab 10/16/21 1611  LIPASE 186*   No results for input(s): AMMONIA in the last 168 hours. Coagulation Profile: Recent Labs  Lab 10/16/21 1611  INR 1.3*   Cardiac Enzymes: No results for input(s): CKTOTAL, CKMB, CKMBINDEX, TROPONINI in the last 168 hours. BNP (last 3 results) No results for input(s): PROBNP in the last 8760 hours. HbA1C: No results for input(s): HGBA1C in the last 72 hours. CBG: Recent Labs  Lab 10/16/21 1343  GLUCAP 183*   Lipid Profile:  No results for input(s): CHOL, HDL, LDLCALC, TRIG, CHOLHDL, LDLDIRECT in the last 72 hours. Thyroid Function Tests: No results for input(s): TSH, T4TOTAL, FREET4, T3FREE, THYROIDAB in the last 72 hours. Anemia Panel: No results for input(s): VITAMINB12, FOLATE, FERRITIN, TIBC, IRON, RETICCTPCT in the last 72 hours. Urine analysis:    Component Value Date/Time   COLORURINE YELLOW 07/09/2020 1744   APPEARANCEUR CLEAR 07/09/2020 1744   LABSPEC 1.010 07/09/2020 1744   PHURINE 6.0 07/09/2020 1744   GLUCOSEU NEGATIVE 07/09/2020 1744   HGBUR NEGATIVE 07/09/2020 1744   BILIRUBINUR NEGATIVE 07/09/2020 1744   KETONESUR NEGATIVE 07/09/2020 1744   PROTEINUR NEGATIVE 07/09/2020 1744   UROBILINOGEN 0.2 03/06/2013 1354   NITRITE NEGATIVE 07/09/2020 1744   LEUKOCYTESUR NEGATIVE 07/09/2020 1744    Radiological Exams on Admission: CT ABDOMEN PELVIS WO CONTRAST  Result Date: 10/16/2021 CLINICAL DATA:  Acute pancreatitis.  Mental status change. EXAM: CT ABDOMEN AND PELVIS WITHOUT CONTRAST TECHNIQUE: Multidetector CT imaging of the abdomen and pelvis was performed following the standard protocol without IV contrast. COMPARISON:  CT abdomen and pelvis 07/07/2020. FINDINGS: Lower chest: No acute abnormality. Hepatobiliary: There are hypodense hepatic lesions previously  characterized as hemangiomas. The largest is in the left lobe measuring 3.3 cm. These appear unchanged. No definite new liver lesions are identified. Gallbladder and bile ducts are within normal limits. Pancreas: There is questionable mild peripancreatic inflammation. Pancreas is otherwise grossly within normal limits, but not well delineated on this noncontrast study. Spleen: Normal in size without focal abnormality. Adrenals/Urinary Tract: Adrenal glands are unremarkable. Kidneys are normal, without renal calculi, focal lesion, or hydronephrosis. Bladder is unremarkable. Stomach/Bowel: There is distal esophageal wall thickening. Stomach moderately distended with air-fluid level. Limits. Appendix is not visualized. No evidence of bowel wall thickening, distention, or inflammatory changes. There is a large amount of stool throughout the colon. Vascular/Lymphatic: No significant vascular findings are present. No enlarged abdominal or pelvic lymph nodes. Reproductive: Prostate is unremarkable. Other: No abdominal wall hernia or abnormality. No focal fluid collections. No abdominopelvic ascites. Musculoskeletal: There are chronic changes of the left hip similar to the prior study. No acute fracture. IMPRESSION: 1. Can not exclude mild diffuse peripancreatic inflammation. No obvious fluid collections. 2. Distal esophageal wall thickening concerning for esophagitis. 3. Stomach is distended with air-fluid level. 4. Large stool burden. Electronically Signed   By: Darliss Cheney M.D.   On: 10/16/2021 18:17   CT Head Wo Contrast  Result Date: 10/16/2021 CLINICAL DATA:  Altered mental status EXAM: CT HEAD WITHOUT CONTRAST TECHNIQUE: Contiguous axial images were obtained from the base of the skull through the vertex without intravenous contrast. COMPARISON:  Head CT dated 04/24/2016. 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 There is dysgenesis of the corpus  callosum. Similar appearance of the fourth ventricular size. No acute intracranial hemorrhage. No midline shift. Vascular: No hyperdense vessel or unexpected calcification. Skull: Normal. Negative for fracture or focal lesion. Sinuses/Orbits: No acute finding. Other: None IMPRESSION: 1. No acute intracranial pathology. 2. 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 Electronically Signed   By: Elgie Collard M.D.   On: 10/16/2021 18:15   DG Chest Portable 1 View  Result Date: 10/16/2021 CLINICAL DATA:  Altered mental status. EXAM: PORTABLE CHEST 1 VIEW COMPARISON:  04/24/2016. FINDINGS: Trachea is midline. Heart size normal. Lungs are clear. No pleural fluid. Nipple shadow projects over the lower left hemithorax. Stool is seen in the distal ascending, transverse and proximal  descending colon. Gaseous distension of the stomach. Dextroconvex scoliosis. IMPRESSION: 1. No acute findings in the chest. 2. Stool in the visualized portions of the colon is indicative of constipation. Electronically Signed   By: Leanna Battles M.D.   On: 10/16/2021 15:22    EKG: Independently reviewed.  Sinus tachycardia, no acute ST changes  Assessment/Plan Principal Problem:   AMS (altered mental status)  (please populate well all problems here in Problem List. (For example, if patient is on BP meds at home and you resume or decide to hold them, it is a problem that needs to be her. Same for CAD, COPD, HLD and so on)  Acute metabolic encephalopathy -GCS=8 -Probably multifactorial, possible contributing factors involving hypernatremia, uremia, infection, possible status atelectasis -Hypernatremia, received 1.5 L IV bolus in the ED, will switch maintenance fluids to half-normal saline, recheck sodium level tomorrow morning. -For uremia, continue high rate of maintenance IV fluids, clinically patient severe hypovolemic dehydrated, currently no indication for HD. -Supple status atelectasis,  discussed with on-call neurology, continue IV Keppra and Vimpat for now.  Admit patient to stepdown unit, currently still sufficiently protecting airway, admitted to PCU for close monitoring, discussed with patient father at bedside.  Breakthrough seizures vs noncoherent -IV Keppra and IV Vimpat for now, discussed with on-call neurology -Check Keppra and Lamictal level. -As needed lorazepam  Sepsis versus severe dehydration -Elevated lactic acid and white count. -Presented with tachycardia and hypotension and signs of endorgan damage encephalopathy although etiology likely multifactorial. -Infection source involving aspiration pneumonia versus UTI, patient received vancomycin and cefepime and Flagyl in the ED, de-escalate to ceftriaxone for now.  AKI -Signs of severe hypovolemia dehydration, IV hydration as above. -Foley for accurate I/Os.  Severe hypernatremia -Hydration as above  Acute pancreatitis -Unknown etiology, both gallbladder and CBD appears to be within normal limits and CAT scan.   -According to father, no history of pancreatitis before, no significant LFTs elevation or bilirubinemia but will trend liver function tomorrow, consider MRCP when patient more stable. -NPO, IV fluid and trend lipase tomorrow.  Elevated glucose -History of diabetes, check A1c, start sliding scale.  Constipation -Soap and start enema today, lactulose enema x2 days starting tomorrow. Then re-evaluate for PO meds.  Severe protein calorie malnutrition -N.p.o. for now -For long-term plan, may need to consider feeding tube.  DVT prophylaxis: Lovenox Code Status: Full code Family Communication: Father a tbedside Disposition Plan: Sick patient, with multiple active issue including seizure, AKI and sepsis, expect more than 2 midnight hospital stay Consults called: Neurology Admission status: PCU   Emeline General MD [facility] Pager [phone]  10/16/2021, 7:27 PM

## 2021-10-16 NOTE — ED Provider Notes (Addendum)
Emergency Medicine Provider Triage Evaluation Note  Carlos [name] , a 35 y.o. male  was evaluated in triage.  Pt nonverbal and unable to provide a history.   Dad at bedside. States he has had seizures over the last few days.  Review of Systems  Positive: N/a Negative: N/a  Physical Exam  BP (!) 55/39   Pulse (!) 53   Temp 97.9 F (36.6 C) (Axillary)   Resp 20   SpO2 93%  Gen:   Awake, cachectic Resp:  Normal effort  MSK:   paraplegic Other:  Dry mucous membranes  Medical Decision Making  Medically screening exam initiated at 1:41 PM.  Appropriate orders placed.  Carlos [name] was informed that the remainder of the evaluation will be completed by another provider, this initial triage assessment does not replace that evaluation, and the importance of remaining in the ED until their evaluation is complete.  Pt hypotensive. He will need to go the next available room.    Karrie Meres, PA-C 10/16/21 1349    Karrie Meres, PA-C 10/16/21 1349    Blane Ohara, MD 10/17/21 1341

## 2021-10-16 NOTE — ED Notes (Signed)
IV team at bedside, EEG tech at bedside

## 2021-10-16 NOTE — Sepsis Progress Note (Signed)
Per secure chat w/ bedside RN, pt is difficult stick and not able to get LA & BCs yet; currently continuing to try.

## 2021-10-16 NOTE — Sepsis Progress Note (Signed)
eLink monitoring code sepsis.  

## 2021-10-16 NOTE — ED Provider Notes (Signed)
[facility] EMERGENCY DEPARTMENT Provider Note   CSN: [id number] Arrival date & time: 10/16/21  1328     History Chief Complaint  Patient presents with   Seizures    Carlos [name] is a 35 y.o. male.  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    Father reports he was out of town and when he returned, Carlos [name]'s mother had reported he had been having more seizures, not eating or drinking well--unclear period of time. Reports since he returned home 3 days ago he has not been eating or drinking much. Has not been able to get him to take much by mouth. Has been getting his seizure medications. Takes food/medicine by mouth.  Had 3 seizures today per dad. Has been fatigued over last several days, not acting self.  Had not had cough, vomiting, diarrhea, had not appeared to be in pain.    Past Medical History:  Diagnosis Date   Agenesis of corpus callosum ([place])    Cerebral palsy ([place])    Folliculitis barbae 03/01/8308   Groin abscess 03/02/2008   Left hip pain 02/05/2016   Pneumonia    Seizures ([place])    Status epilepticus ([place])    Swelling of joint of pelvic region or thigh 02/05/2016   Ventriculomegaly of brain, congenital [facility])     Patient Active Problem List   Diagnosis Date Noted   AMS (altered mental status) 10/16/2021   Loss of weight 10/05/2020   Oppositional behavior 10/05/2020   Oral phase dysphagia    AKI (acute kidney injury) ([place]) 07/07/2020   Hyperproteinemia 07/07/2020   RBC microcytosis 07/07/2020   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   Status epilepticus ([place])    Health maintenance examination 08/14/2016   Breakthrough seizure ([place]) 04/23/2016   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    Constipation 07/25/2015   Partial epilepsy with impairment of consciousness ([place]) 03/14/2013   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[place]) 03/14/2013   Scoliosis associated with other condition 03/14/2013   Amblyopia 11/03/2012   URINARY INCONTINENCE, MIXED 08/01/2009   Intellectual delay 01/14/2007   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 Authorizing Provider  cetirizine HCl (ZYRTEC) 5 MG/5ML SOLN Take 10 mLs (10 mg total) by mouth daily as needed for itching. 08/18/17  Yes Nicolette Bang, MD  Lacosamide 100 MG TABS Take 100-200 mg by mouth See admin instructions. 100 mg in the morning 200 mg at bedtime   Yes [provider]  lamoTRIgine (LAMICTAL) 200 MG tablet Take 200 mg by mouth 2 (two) times daily.   Yes [provider]  lamoTRIgine (LAMICTAL) 25 MG tablet Take 25 mg by mouth 2 (two) times daily.   Yes [provider]  levETIRAcetam (KEPPRA) 750 MG tablet Take 1,500 mg by mouth 2 (two) times daily.   Yes [provider]  polyethylene glycol (MIRALAX / GLYCOLAX) 17 g packet Take 17 g by mouth daily as needed for mild constipation.   Yes [provider]  sodium phosphate Pediatric (FLEET) 3.5-9.5 GM/59ML enema Place 1 enema rectally every three (3) days as needed for constipation.   Yes [provider]  triamcinolone  cream (KENALOG) 0.1 % Apply small amount to rash twice per day Patient taking differently: Apply 1 application topically 2 (two) times daily. 06/30/18  Yes Goodpasture, Otila Kluver, NP  KEPPRA 750 MG tablet Take 2 tablets (1,500 mg total) by mouth 2 (two) times daily. Patient not taking: Reported on 10/16/2021 09/16/21   Rockwell Germany, NP  lactulose [facility]) 10 GM/15ML solution Give 26m by mouth twice per day Patient not taking: Reported on 10/16/2021 07/12/19   GRockwell Germany NP  LAMICTAL 200 MG tablet TAKE 1 TABLET BY MOUTH TWICE A DAY Patient not taking: Reported on 10/16/2021 09/16/21   GRockwell Germany NP  LAMICTAL 25 MG tablet TAKE 1 TABLET BY MOUTH TWICE A DAY Patient not taking: Reported on 10/16/2021 09/16/21   GRockwell Germany NP  VIMPAT 100 MG TABS TAKE 1 TABLET EVERY DAY IN THE MORNING TAKE 2 TABLETS IN THE EVENING Patient not taking: Reported on 10/16/2021 09/16/21   GRockwell Germany NP    Allergies    Patient has no known allergies.  Review of Systems   Review of Systems  Unable to perform ROS: Patient nonverbal  Constitutional:  Positive for appetite change and fatigue. Negative for fever.  Respiratory:  Negative for cough and shortness of breath.   Gastrointestinal:  Negative for diarrhea, nausea and vomiting.  Skin:  Negative for rash.  Neurological:  Positive for seizures.   Physical Exam Updated Vital Signs BP 127/89   Pulse 99   Temp 99.4 F (37.4 C) (Oral)   Resp 13   SpO2 97%   Physical Exam Vitals and nursing note reviewed.  Constitutional:      General: He is not in acute distress.    Appearance: He is well-developed and underweight. He is ill-appearing. He is not diaphoretic.  HENT:     Head: Normocephalic and atraumatic.  Eyes:     Conjunctiva/sclera: Conjunctivae normal.  Cardiovascular:     Rate and Rhythm: Regular rhythm. Tachycardia present.     Heart sounds: Normal heart sounds. No murmur heard.   No friction rub. No gallop.   Pulmonary:     Effort: Pulmonary effort is normal. No respiratory distress.     Breath sounds: Normal breath sounds. No wheezing or rales.  Abdominal:     General: There is no distension.     Palpations: Abdomen is soft.     Tenderness: There is no abdominal tenderness. There is no guarding.  Musculoskeletal:     Cervical back: Normal range of motion.  Skin:    General: Skin is warm and dry.  Neurological:     Mental Status: He is alert.    ED Results / Procedures / Treatments   Labs (all labs ordered are listed, but only abnormal results are displayed) Labs Reviewed  CBC WITH DIFFERENTIAL/PLATELET - Abnormal; Notable for the following components:      Result Value   WBC 12.8 (*)    [facility]  24.6 (*)    MCHC 28.1 (*)    Platelets 100 (*)    nRBC 0.4 (*)    Neutro Abs 10.3 (*)    Abs Immature Granulocytes 0.11 (*)    All other components within normal limits  COMPREHENSIVE METABOLIC PANEL - Abnormal; Notable for the following components:   Sodium 159 (*)    Chloride 123 (*)    Glucose, Bld 238 (*)    BUN 113 (*)    Creatinine, Ser 2.72 (*)    Calcium 8.5 (*)    Albumin 3.0 (*)    GFR, Estimated 30 (*)    All other components within normal limits  LACTIC ACID, PLASMA - Abnormal; Notable for the following components:   Lactic Acid, Venous 5.1 (*)    All other components within normal limits  LACTIC ACID, PLASMA - Abnormal; Notable for the following components:   Lactic Acid, Venous 4.1 (*)    All other components within normal limits  PROTIME-INR - Abnormal; Notable for the following components:   Prothrombin Time 16.2 (*)    INR 1.3 (*)    All other components within normal limits  LIPASE, BLOOD - Abnormal; Notable for the following components:   Lipase 186 (*)    All other components within normal limits  AMMONIA - Abnormal; Notable for the following components:   Ammonia 46 (*)    All other components within normal limits  I-STAT CHEM 8, ED - Abnormal; Notable for  the following components:   Sodium 159 (*)    Chloride 129 (*)    BUN 112 (*)    Creatinine, Ser 2.70 (*)    Glucose, Bld 229 (*)    Calcium, Ion 0.91 (*)    TCO2 21 (*)    All other components within normal limits  CBG MONITORING, ED - Abnormal; Notable for the following components:   Glucose-Capillary 183 (*)    All other components within normal limits  I-STAT VENOUS BLOOD GAS, ED - Abnormal; Notable for the following components:   pH, Ven 7.454 (*)    pCO2, Ven 30.1 (*)    pO2, Ven 57.0 (*)    Sodium 160 (*)    Calcium, Ion 0.95 (*)    All other components within normal limits  RESP PANEL BY RT-PCR (FLU A&B, COVID) ARPGX2  CULTURE, BLOOD (ROUTINE X 2)  CULTURE, BLOOD (ROUTINE X 2)  URINE CULTURE  APTT  HIV ANTIBODY (ROUTINE TESTING W REFLEX)  URINALYSIS, ROUTINE W REFLEX MICROSCOPIC  LEVETIRACETAM LEVEL  LAMOTRIGINE LEVEL  LIPASE, BLOOD  LIPID PANEL  CBC  HEMOGLOBIN A1C  COMPREHENSIVE METABOLIC PANEL    EKG EKG Interpretation  Date/Time:  Wednesday October 16 2021 13:32:41 EST Ventricular Rate:  139 PR Interval:  122 QRS Duration: 86 QT Interval:  296 QTC Calculation: 450 R Axis:   261 Text Interpretation: Sinus tachycardia Right atrial enlargement Right superior axis deviation Pulmonary disease pattern Abnormal ECG Since prior ECG< rate has increased Confirmed by Gareth Morgan [phone]) on 10/16/2021 4:14:23 PM  Radiology CT ABDOMEN PELVIS WO CONTRAST  Result Date: 10/16/2021 CLINICAL DATA:  Acute pancreatitis.  Mental status change. EXAM: CT ABDOMEN AND PELVIS WITHOUT CONTRAST TECHNIQUE: Multidetector CT imaging of the abdomen and pelvis was performed following the standard protocol without IV contrast. COMPARISON:  CT abdomen and pelvis 07/07/2020. FINDINGS: Lower chest: No acute abnormality. 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 The largest is in the left lobe measuring 3.3 cm. These appear  unchanged.  No definite new liver lesions are identified. Gallbladder and bile ducts are within normal limits. Pancreas: There is questionable mild peripancreatic inflammation. Pancreas is otherwise grossly within normal limits, but not well delineated on this noncontrast study. Spleen: Normal in size without focal abnormality. Adrenals/Urinary Tract: Adrenal glands are unremarkable. Kidneys are normal, without renal calculi, focal lesion, or hydronephrosis. Bladder is unremarkable. Stomach/Bowel: There is distal esophageal wall thickening. Stomach moderately distended with air-fluid level. Limits. Appendix is not visualized. No evidence of bowel wall thickening, distention, or inflammatory changes. There is a large amount of stool throughout the colon. Vascular/Lymphatic: No significant vascular findings are present. No enlarged abdominal or pelvic lymph nodes. Reproductive: Prostate is unremarkable. Other: No abdominal wall hernia or abnormality. No focal fluid collections. No abdominopelvic ascites. Musculoskeletal: There are chronic changes of the left hip similar to the prior study. No acute fracture. IMPRESSION: 1. Can not exclude mild diffuse peripancreatic inflammation. No obvious fluid collections. 2. Distal esophageal wall thickening concerning for esophagitis. 3. Stomach is distended with air-fluid level. 4. Large stool burden. Electronically Signed   By: Ronney Asters M.D.   On: 10/16/2021 18:17   CT Head Wo Contrast  Result Date: 10/16/2021 CLINICAL DATA:  Altered mental status EXAM: CT HEAD WITHOUT CONTRAST TECHNIQUE: Contiguous axial images were obtained from the base of the skull through the vertex without intravenous contrast. COMPARISON:  Head CT dated 04/24/2016. 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 Similar appearance of the fourth ventricular size. No acute intracranial hemorrhage. No midline shift.  Vascular: No hyperdense vessel or unexpected calcification. Skull: Normal. Negative for fracture or focal lesion. Sinuses/Orbits: No acute finding. Other: None IMPRESSION: 1. No acute intracranial pathology. 2. 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 Electronically Signed   By: Anner Crete M.D.   On: 10/16/2021 18:15   DG Chest Portable 1 View  Result Date: 10/16/2021 CLINICAL DATA:  Altered mental status. EXAM: PORTABLE CHEST 1 VIEW COMPARISON:  04/24/2016. FINDINGS: Trachea is midline. Heart size normal. Lungs are clear. No pleural fluid. Nipple shadow projects over the lower left hemithorax. Stool is seen in the distal ascending, transverse and proximal descending colon. Gaseous distension of the stomach. Dextroconvex scoliosis. IMPRESSION: 1. No acute findings in the chest. 2. Stool in the visualized portions of the colon is indicative of constipation. Electronically Signed   By: Lorin Picket M.D.   On: 10/16/2021 15:22    Procedures Procedures   Medications Ordered in ED Medications  pantoprazole (PROTONIX) injection 40 mg (40 mg Intravenous Given 10/16/21 2119)  chlorhexidine gluconate (MEDLINE KIT) (PERIDEX) 0.12 % solution 15 mL (15 mLs Mouth Rinse Given 10/16/21 2058)  MEDLINE mouth rinse (15 mLs Mouth Rinse Given 10/17/21 0315)  heparin injection 5,000 Units (5,000 Units Subcutaneous Given 10/16/21 2127)  ondansetron (ZOFRAN) tablet 4 mg (has no administration in time range)    Or  ondansetron (ZOFRAN) injection 4 mg (has no administration in time range)  lactulose (CHRONULAC) enema 200 gm (has no administration in time range)  0.45 % sodium chloride infusion ( Intravenous Restarted 10/16/21 2346)  LORazepam (ATIVAN) injection 4 mg (has no administration in time range)  cefTRIAXone (ROCEPHIN) 1 g in sodium chloride 0.9 % 100 mL IVPB (0 g Intravenous Stopped 10/16/21 2156)  insulin aspart (novoLOG) injection 0-9 Units (has no administration in  time range)  lacosamide (VIMPAT) 200 mg in sodium chloride 0.9 %  25 mL IVPB (0 mg Intravenous Stopped 10/16/21 2346)  levETIRAcetam (KEPPRA) 750 mg in sodium chloride 0.9 % 100 mL IVPB (has no administration in time range)  metroNIDAZOLE (FLAGYL) IVPB 500 mg (0 mg Intravenous Stopped 10/16/21 1725)  vancomycin (VANCOREADY) IVPB 1000 mg/200 mL (0 mg Intravenous Stopped 10/16/21 1721)  lactated ringers bolus 2,000 mL (0 mLs Intravenous Stopped 10/16/21 1812)  levETIRAcetam (KEPPRA) IVPB 1500 mg/ 100 mL premix (0 mg Intravenous Stopped 10/16/21 1752)  LORazepam (ATIVAN) injection 2 mg (2 mg Intravenous Given 10/16/21 2118)    ED Course  I have reviewed the triage vital signs and the nursing notes.  Pertinent labs & imaging results that were available during my care of the patient were reviewed by me and considered in my medical decision making (see chart for details).    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  Work up initiated with previous providers, pt with hypotension and tachycardia on arrival to ED, IV fluids and empiric abx ordered.  Work up significant for severe hypernatremia, AKI, lactic acidosis.  Difficulty obtaining IV access, once obtained fluids significantly improved BP.  Suspect likely dehydration, less likely sepsis.  Signs of mild pancreatitis on CT and imaging.  Admitted for continued care.   Final Clinical Impression(s) / ED Diagnoses Final diagnoses:  Dehydration  Hypernatremia  Lactic acidosis  Acute kidney injury (HCC)  Seizure ([place])  Acute pancreatitis, unspecified complication status, unspecified pancreatitis type    Rx / DC Orders ED  Discharge Orders     None        Gareth Morgan, MD 10/17/21 [phone]

## 2021-10-17 DIAGNOSIS — R4182 Altered mental status, unspecified: Secondary | ICD-10-CM

## 2021-10-17 LAB — BASIC METABOLIC PANEL
Anion gap: 6 (ref 5–15)
Anion gap: 8 (ref 5–15)
Anion gap: 8 (ref 5–15)
BUN: 53 mg/dL — ABNORMAL HIGH (ref 6–20)
BUN: 46 mg/dL — ABNORMAL HIGH (ref 6–20)
BUN: 40 mg/dL — ABNORMAL HIGH (ref 6–20)
CO2: 27 mmol/L (ref 22–32)
CO2: 23 mmol/L (ref 22–32)
CO2: 22 mmol/L (ref 22–32)
Calcium: 7.3 mg/dL — ABNORMAL LOW (ref 8.9–10.3)
Calcium: 7.5 mg/dL — ABNORMAL LOW (ref 8.9–10.3)
Calcium: 7.4 mg/dL — ABNORMAL LOW (ref 8.9–10.3)
Chloride: 122 mmol/L — ABNORMAL HIGH (ref 98–111)
Chloride: 121 mmol/L — ABNORMAL HIGH (ref 98–111)
Chloride: 122 mmol/L — ABNORMAL HIGH (ref 98–111)
Creatinine, Ser: 0.85 mg/dL (ref 0.61–1.24)
Creatinine, Ser: 0.75 mg/dL (ref 0.61–1.24)
Creatinine, Ser: 0.72 mg/dL (ref 0.61–1.24)
GFR, Estimated: >60 mL/min (ref >60)
GFR, Estimated: >60 mL/min (ref >60)
GFR, Estimated: >60 mL/min (ref >60)
Glucose, Bld: 104 mg/dL — ABNORMAL HIGH (ref 70–99)
Glucose, Bld: 89 mg/dL (ref 70–99)
Glucose, Bld: 88 mg/dL (ref 70–99)
Potassium: 3.4 mmol/L — ABNORMAL LOW (ref 3.5–5.1)
Potassium: 4.7 mmol/L (ref 3.5–5.1)
Potassium: 4.4 mmol/L (ref 3.5–5.1)
Sodium: 155 mmol/L — ABNORMAL HIGH (ref 135–145)
Sodium: 152 mmol/L — ABNORMAL HIGH (ref 135–145)
Sodium: 152 mmol/L — ABNORMAL HIGH (ref 135–145)

## 2021-10-17 LAB — URINALYSIS, ROUTINE W REFLEX MICROSCOPIC
Bilirubin Urine: NEGATIVE
Glucose, UA: NEGATIVE mg/dL
Ketones, ur: NEGATIVE mg/dL
Leukocytes,Ua: NEGATIVE
Nitrite: NEGATIVE
Protein, ur: NEGATIVE mg/dL
Specific Gravity, Urine: 1.02 (ref 1.005–1.030)
pH: 5.5 (ref 5.0–8.0)

## 2021-10-17 LAB — COMPREHENSIVE METABOLIC PANEL
ALT: 10 U/L (ref 0–44)
AST: 29 U/L (ref 15–41)
Albumin: 2.2 g/dL — ABNORMAL LOW (ref 3.5–5.0)
Alkaline Phosphatase: 40 U/L (ref 38–126)
Anion gap: 5 (ref 5–15)
BUN: 68 mg/dL — ABNORMAL HIGH (ref 6–20)
CO2: 25 mmol/L (ref 22–32)
Calcium: 7.4 mg/dL — ABNORMAL LOW (ref 8.9–10.3)
Chloride: 122 mmol/L — ABNORMAL HIGH (ref 98–111)
Creatinine, Ser: 1.14 mg/dL (ref 0.61–1.24)
GFR, Estimated: >60 mL/min (ref >60)
Glucose, Bld: 108 mg/dL — ABNORMAL HIGH (ref 70–99)
Potassium: 4 mmol/L (ref 3.5–5.1)
Sodium: 152 mmol/L — ABNORMAL HIGH (ref 135–145)
Total Bilirubin: 0.5 mg/dL (ref 0.3–1.2)
Total Protein: 5.5 g/dL — ABNORMAL LOW (ref 6.5–8.1)

## 2021-10-17 LAB — MRSA NEXT GEN BY PCR, NASAL: MRSA by PCR Next Gen: NOT DETECTED

## 2021-10-17 LAB — URINALYSIS, MICROSCOPIC (REFLEX): Bacteria, UA: NONE SEEN

## 2021-10-17 LAB — CBC
HCT: 39.7 % (ref 39.0–52.0)
Hemoglobin: 11.6 g/dL — ABNORMAL LOW (ref 13.0–17.0)
MCH: 25.1 pg — ABNORMAL LOW (ref 26.0–34.0)
MCHC: 29.2 g/dL — ABNORMAL LOW (ref 30.0–36.0)
MCV: 85.7 fL (ref 80.0–100.0)
Platelets: 61 10*3/uL — ABNORMAL LOW (ref 150–400)
RBC: 4.63 MIL/uL (ref 4.22–5.81)
RDW: 13.9 % (ref 11.5–15.5)
WBC: 11.2 10*3/uL — ABNORMAL HIGH (ref 4.0–10.5)
nRBC: 0.2 % (ref 0.0–0.2)

## 2021-10-17 LAB — HEMOGLOBIN A1C
Hgb A1c MFr Bld: 6.1 % — ABNORMAL HIGH (ref 4.8–5.6)
Mean Plasma Glucose: 128.37 mg/dL

## 2021-10-17 LAB — LIPID PANEL
Cholesterol: 141 mg/dL (ref 0–200)
HDL: 14 mg/dL — ABNORMAL LOW (ref >40)
LDL Cholesterol: 87 mg/dL (ref 0–99)
Total CHOL/HDL Ratio: 10.1 RATIO
Triglycerides: 201 mg/dL — ABNORMAL HIGH (ref <150)
VLDL: 40 mg/dL (ref 0–40)

## 2021-10-17 LAB — CBG MONITORING, ED
Glucose-Capillary: 91 mg/dL (ref 70–99)
Glucose-Capillary: 98 mg/dL (ref 70–99)

## 2021-10-17 LAB — GLUCOSE, CAPILLARY
Glucose-Capillary: 72 mg/dL (ref 70–99)
Glucose-Capillary: 87 mg/dL (ref 70–99)
Glucose-Capillary: 70 mg/dL (ref 70–99)

## 2021-10-17 LAB — LIPASE, BLOOD: Lipase: 117 U/L — ABNORMAL HIGH (ref 11–51)

## 2021-10-17 MED ORDER — SODIUM CHLORIDE 0.45 % IV SOLN: INTRAVENOUS | Status: AC

## 2021-10-17 MED ORDER — CHLORHEXIDINE GLUCONATE CLOTH 2 % EX PADS
6.0000 | MEDICATED_PAD | Freq: Every day | CUTANEOUS | Status: DC
  Administered 2021-10-17 – 2021-10-28 (×12): 6 via TOPICAL

## 2021-10-17 MED ORDER — DEXTROSE 50 % IV SOLN
25.0000 mL | Freq: Once | INTRAVENOUS | Status: AC
Start: 2021-10-17 — End: 2021-10-17
  Administered 2021-10-17: 25 mL via INTRAVENOUS
  Filled 2021-10-17: qty 50

## 2021-10-17 NOTE — Progress Notes (Signed)
Pt arrived to floor. VS WNL. CHG bath and MRSA PCR complete. Pt baseline nonverbal responds to pain. Telemetry placed on pt. Seizures precautions in place. Call bell within reach.

## 2021-10-17 NOTE — ED Notes (Signed)
This RN called 4NP and attempted to initiate purple man

## 2021-10-17 NOTE — Progress Notes (Signed)
   10/17/21 2036  Assess: MEWS Score  Temp (!) 96.9 F (36.1 C)  BP 103/85  Pulse Rate (!) 103  ECG Heart Rate (!) 103  Resp 17  Level of Consciousness Responds to Pain  SpO2 100 %  O2 Device Room Air  Assess: MEWS Score  MEWS Temp 0  MEWS Systolic 0  MEWS Pulse 1  MEWS RR 0  MEWS LOC 2  MEWS Score 3  MEWS Score Color Yellow  Treat  Pain Scale PAINAD  Pain Score 0  Breathing 0  Negative Vocalization 0  Facial Expression 0  Body Language 0  Consolability 0  PAINAD Score 0  Take Vital Signs  Increase Vital Sign Frequency  Yellow: Q 2hr X 2 then Q 4hr X 2, if remains yellow, continue Q 4hrs  Escalate  MEWS: Escalate Yellow: discuss with charge nurse/RN and consider discussing with provider and RRT  Notify: Charge Nurse/RN  Name of Charge Nurse/RN Notified Gladys, RN  Date Charge Nurse/RN Notified 10/17/21  Time Charge Nurse/RN Notified 2112  Document  Patient Outcome Other (Comment) (baseline here)  Progress note created (see row info) Yes

## 2021-10-17 NOTE — ED Notes (Signed)
Patient repositioned onto right side to supine position

## 2021-10-17 NOTE — ED Notes (Signed)
Patient rolled to check for bowel movement, no bowel movement at this time. Patients brief remains clean and dry

## 2021-10-17 NOTE — ED Notes (Signed)
Patient repositioned into supine position

## 2021-10-17 NOTE — Progress Notes (Signed)
Neurology Progress Note  Brief HPI: 35 y.o. 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 He was lethargic on 11/29 and had one seizure that resolved spontaneously after a few minutes. He had a second seizure on 11/30 with decreased responsiveness following the seizure. At baseline, patient will respond with head and eye movement to speech but was altered after his seizure on 11/30. Patient's family also reported an approximate 15 pound weight loss over the past 12 months. Initial work up revealed patient to be hypotensive, tachycardic, with low-grade fever with negative chest x-ray imaging and CTH. Blood work showed hypernatremia with sodium of 159, creatinine 2.7, BUN 113 and elevated lactate of 5. Patient was given a 1500 mg Keppra load in the ER and neurology was consulted for further evaluation.  Subjective: No acute overnight events noted  Exam: Vitals:   10/17/21 0700 10/17/21 0715  BP: 102/84 140/83  Pulse: 72 81  Resp: 12 14  Temp:    SpO2: 100% 100%   Gen: Laying in hospital bed with eyes closed Resp: non-labored breathing, no respiratory distress, SpO2 100% on room air Abd: soft, non-distended  Neuro: Mental Status: Laying in bed with eyes closed with minimal/brief eye opening to loud voice, does not fixate or track examiner.  He is nonverbal at baseline, does not follow commands.  No evidence of neglect noted.  Cranial Nerves: Left pupil is round, 5 mm and briskly reactive to light, right pupil is 3 mm and briskly reactive to light, gaze is dysconjugate, patient resists examiner eye opening, oculocephalic reflex is intact, hearing is intact to voice.  Motor: Withdraws or attempts to withdraw with contractures throughout. Does not follow  commands.  Bulk is decreased throughout.  Sensory: Withdraws or attempts to withdraw in all 4 extremities without focality Gait: Deferred  Pertinent Labs: CBC    Component Value Date/Time   WBC 11.2 (H) 10/17/2021 0548   RBC 4.63 10/17/2021 0548   HGB 11.6 (L) 10/17/2021 0548   HCT 39.7 10/17/2021 0548   PLT 61 (L) 10/17/2021 0548   MCV 85.7 10/17/2021 0548   MCH 25.1 (L) 10/17/2021 0548   MCHC 29.2 (L) 10/17/2021 0548   RDW 13.9 10/17/2021 0548   LYMPHSABS 1.6 10/16/2021 1456   MONOABS 0.8 10/16/2021 1456   EOSABS 0.1 10/16/2021 1456   BASOSABS 0.0 10/16/2021 1456   CMP     Component Value Date/Time   NA 152 (H) 10/17/2021 0548   K 4.0 10/17/2021 0548   CL 122 (H) 10/17/2021 0548   CO2 25 10/17/2021 0548   GLUCOSE 108 (H) 10/17/2021 0548   BUN 68 (H) 10/17/2021 0548   CREATININE 1.14 10/17/2021 0548   CREATININE 0.88 04/06/2017 1045   CALCIUM 7.4 (L) 10/17/2021 0548   PROT 5.5 (L) 10/17/2021 0548   ALBUMIN 2.2 (L) 10/17/2021 0548   AST 29 10/17/2021 0548   ALT 10 10/17/2021 0548   ALKPHOS 40 10/17/2021 0548   BILITOT 0.5 10/17/2021 0548   GFRNONAA >60 10/17/2021 0548   GFRAA >60 07/09/2020 0210   Urinalysis    Component Value Date/Time   COLORURINE YELLOW 10/17/2021 0900   APPEARANCEUR CLEAR 10/17/2021 0900   LABSPEC 1.020 10/17/2021 0900   PHURINE 5.5 10/17/2021 0900   GLUCOSEU NEGATIVE 10/17/2021 0900   HGBUR MODERATE (A) 10/17/2021 0900  BILIRUBINUR NEGATIVE 10/17/2021 0900   KETONESUR NEGATIVE 10/17/2021 0900   PROTEINUR NEGATIVE 10/17/2021 0900   UROBILINOGEN 0.2 03/06/2013 1354   NITRITE NEGATIVE 10/17/2021 0900   LEUKOCYTESUR NEGATIVE 10/17/2021 0900   Lab Results  Component Value Date   CHOL 141 10/17/2021   HDL 14 (L) 10/17/2021   LDLCALC 87 10/17/2021   TRIG 201 (H) 10/17/2021   CHOLHDL 10.1 10/17/2021    Latest Reference Range & Units 10/16/21 20:46  HIV Screen 4th Generation wRfx Non Reactive  Non Reactive    Latest Reference Range  & Units 10/16/21 20:46  Lactic Acid, Venous 0.5 - 1.9 mmol/L 4.1 (HH)    Latest Reference Range & Units 10/16/21 20:46  Ammonia 9 - 35 umol/L 46 (H)   Results for orders placed or performed during the hospital encounter of 10/16/21  Resp Panel by RT-PCR (Flu A&B, Covid) Nasopharyngeal Swab     Status: None   Collection Time: 10/16/21  2:54 PM   Specimen: Nasopharyngeal Swab; Nasopharyngeal(NP) swabs in vial transport medium  Result Value Ref Range Status   SARS Coronavirus 2 by RT PCR NEGATIVE NEGATIVE Final    Comment: (NOTE) SARS-CoV-2 target nucleic acids are NOT DETECTED.  The SARS-CoV-2 RNA is generally detectable in upper respiratory specimens during the acute phase of infection. The lowest concentration of SARS-CoV-2 viral copies this assay can detect is 138 copies/mL. A negative result does not preclude SARS-Cov-2 infection and should not be used as the sole basis for treatment or other patient management decisions. A negative result may occur with  improper specimen collection/handling, submission of specimen other than nasopharyngeal swab, presence of viral mutation(s) within the areas targeted by this assay, and inadequate number of viral copies(<138 copies/mL). A negative result must be combined with clinical observations, patient history, and epidemiological information. The expected result is Negative.  Fact Sheet for Patients:  BloggerCourse.com  Fact Sheet for Healthcare Providers:  SeriousBroker.it  This test is no t yet approved or cleared by the [place] FDA and  has been authorized for detection and/or diagnosis of SARS-CoV-2 by FDA under an Emergency Use Authorization (EUA). This EUA will remain  in effect (meaning this test can be used) for the duration of the COVID-19 declaration under Section 564(b)(1) of the Act, 21 U.S.C.section 360bbb-3(b)(1), unless the authorization is terminated  or revoked  sooner.       Influenza A by PCR NEGATIVE NEGATIVE Final   Influenza B by PCR NEGATIVE NEGATIVE Final    Comment: (NOTE) The Xpert Xpress SARS-CoV-2/FLU/RSV plus assay is intended as an aid in the diagnosis of influenza from Nasopharyngeal swab specimens and should not be used as a sole basis for treatment. Nasal washings and aspirates are unacceptable for Xpert Xpress SARS-CoV-2/FLU/RSV testing.  Fact Sheet for Patients: BloggerCourse.com  Fact Sheet for Healthcare Providers: SeriousBroker.it  This test is not yet approved or cleared by the [place] FDA and has been authorized for detection and/or diagnosis of SARS-CoV-2 by FDA under an Emergency Use Authorization (EUA). This EUA will remain in effect (meaning this test can be used) for the duration of the COVID-19 declaration under Section 564(b)(1) of the Act, 21 U.S.C. section 360bbb-3(b)(1), unless the authorization is terminated or revoked.  Performed at [facility], [address], [place], [place] [address]   Blood Culture (routine x 2)     Status: None (Preliminary result)   Collection Time: 10/16/21  4:14 PM   Specimen: BLOOD  Result Value Ref Range  Status   Specimen Description BLOOD RIGHT ANTECUBITAL  Final   Special Requests   Final    BOTTLES DRAWN AEROBIC ONLY Blood Culture adequate volume   Culture   Final    NO GROWTH < 24 HOURS Performed at [facility], [address], [place], [place] [address]    Report Status PENDING  Incomplete   Imaging Reviewed:  EEG 11/30: "This study is suggestive of severe diffuse encephalopathy, nonspecific etiology. 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  - Unclear if he has been compliant to medications due to regular family caretaker being out of town and alternate family member taking care of patient, not being the primary caregiver per chart review. - Also noted to be mildly febrile, hypernatremic, lactic acidosis along with large stool burden in his abdomen. - Likely breakthrough seizures in the setting of metabolic abnormalities, EEG obtained and is suggestive of severe diffuse encephalopathy of nonspecific etiology without evidence of seizures or definite epileptiform discharges.    Recommendations: - Keppra and lamotrigine levels pending - Continue home antiepileptics: Keppra 750 twice daily given deranged renal function, Vimpat 200 mg twice daily-both of these can be given IV until his p.o. intake is established or NGT placement is successful.  - Lamictal unfortunately cannot be given parenterally.  Okay to hold for now, with further evidence of seizures will add third antiepileptic agent that can be given parenterally. Will need to establish PO intake or NGT for re-initiation of Lamictal therapy.  - Aggressive management of constipation and toxic/metabolic derangements including hypernatremia and lactic acidosis including search for a cause for this.  Low suspicion for CNS infection at this time-do not need antibiotic coverage for meningitic or encephalitic etiology. - Avoid cefepime as it can cause neurotoxicity. - Seizure precautions - Management of hyperammonemia and metabolic derangements per primary team   Lanae Boast, AGACNP-BC [facility] [phone]  Electronically signed: Dr. Caryl Pina

## 2021-10-17 NOTE — Progress Notes (Signed)
PROGRESS NOTE    Carlos [name]  SHU:[id number] DOB: [birth year] DOA: 10/16/2021 PCP: Pcp, No   Brief Narrative:  Carlos [name] is a 35 y.o. 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  Patient apparently has been having breakthrough seizures per discussion at admission per mother who is now been admitted to the hospital herself, concern the patient had been missing home medications due to her illness at home while patient's father has been out of town.   Assessment & Plan:   Acute metabolic encephalopathy on known baseline cerebral palsy, POA -Likely in the setting of acute breakthrough seizure as below -Rule out polypharmacy -Unlikely infectious process, discontinue antibiotics given patient remains without fever; leukocytosis minimal likely in the setting of hemoconcentration given below -Hyperammonemia, mild, continue lactulose, rectally if unable to take p.o. appropriately or safely   Acute breakthrough seizure, concern for medication noncompliance  -IV Keppra and IV Vimpat for now -Neurology following, appreciate insight and recommendations -Keppra and Lamictal level -pending. -As needed lorazepam   Hypovolemic hypernatremia, severe Hemoconcentration in the setting of profound dehydration and poor p.o. intake Concurrent lactic acidosis -Hyponatremia, lactic acidosis downtrending with IV fluids  -Continue half-normal saline, follow sodium -currently downtrending appropriately    AKI -Continue aggressive IV fluids as above   Questionable acute pancreatitis -Based on imaging, physical exam somewhat limited given patient's baseline mental status with acute metabolic encephalopathy overlying  -Imaging unremarkable for acute findings to explain pancreatitis with normal-appearing gallbladder and common bile duct within normal limits  -NPO, IV fluid and trend lipase  tomorrow.   Reported history of diabetes,, well controlled A1c 6.1 Continue sliding scale close monitoring while n.p.o., hypoglycemic protocol ongoing   Chronic severe protein calorie malnutrition -NPO until able to take p.o. safely, speech to follow -Feeding tube discussed at intake   DVT prophylaxis: Lovenox Code Status: Full code Family Communication: None present, mother has been admitted to the hospital in the interim  Status is: Inpatient  Dispo: The patient is from: Home              Anticipated d/c is to: To be determined              Anticipated d/c date is: 48 to 72 hours              Patient currently not medically stable for discharge  Consultants:  Neurology  Procedures:  None  Antimicrobials:  Discontinued 10/17/2021  Subjective: No acute issues or events overnight  Objective: Vitals:   10/17/21 0615 10/17/21 0630 10/17/21 0700 10/17/21 0715  BP: 130/90 127/87 102/84 140/83  Pulse: 61 71 72 81  Resp: _0 Temp:      TempSrc:      SpO2: 100% 100% 100% 100%   No intake or output data in the 24 hours ending 10/17/21 0802 There were no vitals filed for this visit.  Examination:  General: Cachectic appearing gentleman, pleasantly resting in bed, No acute distress.  Minimally responsive to tactile stimuli but protecting airway HEENT:  Normocephalic atraumatic.  Sclerae nonicteric, noninjected.  Extraocular movements intact bilaterally. Neck:  Without mass or deformity.  Trachea is midline. Lungs:  Clear to auscultate bilaterally without rhonchi, wheeze, or rales. Heart: Tachycardic, regular rhythm without murmurs rubs or gallops Abdomen:  Soft, nontender, nondistended.  Without guarding or rebound. Extremities: Without overt edema, cyanosis or clubbing. Vascular:  Dorsalis pedis and  posterior tibial pulses palpable bilaterally. Skin:  Warm and dry, no erythema, multiple fluid-filled large bullae over patient's wrist knees ankles, see nursing  documentation   Data Reviewed: I have personally reviewed following labs and imaging studies  CBC: Recent Labs  Lab 10/16/21 1456 10/16/21 1504 10/17/21 0548  WBC 12.8*  --  11.2*  NEUTROABS 10.3*  --   --   HGB 13.5 14.6  14.6 11.6*  HCT 48.1 43.0  43.0 39.7  MCV 87.8  --  85.7  PLT 100*  --  61*   Basic Metabolic Panel: Recent Labs  Lab 10/16/21 1456 10/16/21 1504 10/17/21 0548  NA 159* 160*  159* 152*  K 4.0 4.1  4.0 4.0  CL 123* 129* 122*  CO2 22  --  25  GLUCOSE 238* 229* 108*  BUN 113* 112* 68*  CREATININE 2.72* 2.70* 1.14  CALCIUM 8.5*  --  7.4*   GFR: CrCl cannot be calculated (Unknown ideal weight.). Liver Function Tests: Recent Labs  Lab 10/16/21 1456 10/17/21 0548  AST 36 29  ALT 13 10  ALKPHOS 44 40  BILITOT 0.6 0.5  PROT 7.3 5.5*  ALBUMIN 3.0* 2.2*   Recent Labs  Lab 10/16/21 1611 10/17/21 0548  LIPASE 186* 117*   Recent Labs  Lab 10/16/21 2046  AMMONIA 46*   Coagulation Profile: Recent Labs  Lab 10/16/21 1611  INR 1.3*   Cardiac Enzymes: No results for input(s): CKTOTAL, CKMB, CKMBINDEX, TROPONINI in the last 168 hours. BNP (last 3 results) No results for input(s): PROBNP in the last 8760 hours. HbA1C: Recent Labs    10/17/21 0548  HGBA1C 6.1*   CBG: Recent Labs  Lab 10/16/21 1343 10/17/21 0529 10/17/21 0752  GLUCAP 183* 91 98   Lipid Profile: Recent Labs    10/17/21 0548  CHOL 141  HDL 14*  LDLCALC 87  TRIG 201*  CHOLHDL 10.1   Thyroid Function Tests: No results for input(s): TSH, T4TOTAL, FREET4, T3FREE, THYROIDAB in the last 72 hours. Anemia Panel: No results for input(s): VITAMINB12, FOLATE, FERRITIN, TIBC, IRON, RETICCTPCT in the last 72 hours. Sepsis Labs: Recent Labs  Lab 10/16/21 1611 10/16/21 2046  LATICACIDVEN 5.1* 4.1*    Recent Results (from the past 240 hour(s))  Resp Panel by RT-PCR (Flu A&B, Covid) Nasopharyngeal Swab     Status: None   Collection Time: 10/16/21  2:54 PM    Specimen: Nasopharyngeal Swab; Nasopharyngeal(NP) swabs in vial transport medium  Result Value Ref Range Status   SARS Coronavirus 2 by RT PCR NEGATIVE NEGATIVE Final    Comment: (NOTE) SARS-CoV-2 target nucleic acids are NOT DETECTED.  The SARS-CoV-2 RNA is generally detectable in upper respiratory specimens during the acute phase of infection. The lowest concentration of SARS-CoV-2 viral copies this assay can detect is 138 copies/mL. A negative result does not preclude SARS-Cov-2 infection and should not be used as the sole basis for treatment or other patient management decisions. A negative result may occur with  improper specimen collection/handling, submission of specimen other than nasopharyngeal swab, presence of viral mutation(s) within the areas targeted by this assay, and inadequate number of viral copies(<138 copies/mL). A negative result must be combined with clinical observations, patient history, and epidemiological information. The expected result is Negative.  Fact Sheet for Patients:  EntrepreneurPulse.com.au  Fact Sheet for Healthcare Providers:  IncredibleEmployment.be  This test is no t yet approved or cleared by the [place] FDA and  has been authorized for detection and/or diagnosis of  SARS-CoV-2 by FDA under an Emergency Use Authorization (EUA). This EUA will remain  in effect (meaning this test can be used) for the duration of the COVID-19 declaration under Section 564(b)(1) of the Act, 21 U.S.C.section 360bbb-3(b)(1), unless the authorization is terminated  or revoked sooner.       Influenza A by PCR NEGATIVE NEGATIVE Final   Influenza B by PCR NEGATIVE NEGATIVE Final    Comment: (NOTE) The Xpert Xpress SARS-CoV-2/FLU/RSV plus assay is intended as an aid in the diagnosis of influenza from Nasopharyngeal swab specimens and should not be used as a sole basis for treatment. Nasal washings and aspirates are  unacceptable for Xpert Xpress SARS-CoV-2/FLU/RSV testing.  Fact Sheet for Patients: EntrepreneurPulse.com.au  Fact Sheet for Healthcare Providers: IncredibleEmployment.be  This test is not yet approved or cleared by the [place] FDA and has been authorized for detection and/or diagnosis of SARS-CoV-2 by FDA under an Emergency Use Authorization (EUA). This EUA will remain in effect (meaning this test can be used) for the duration of the COVID-19 declaration under Section 564(b)(1) of the Act, 21 U.S.C. section 360bbb-3(b)(1), unless the authorization is terminated or revoked.  Performed at [facility], [place] [address], [place], [place] [address]   Blood Culture (routine x 2)     Status: None (Preliminary result)   Collection Time: 10/16/21  4:14 PM   Specimen: BLOOD  Result Value Ref Range Status   Specimen Description BLOOD RIGHT ANTECUBITAL  Final   Special Requests   Final    BOTTLES DRAWN AEROBIC ONLY Blood Culture adequate volume   Culture   Final    NO GROWTH < 24 HOURS Performed at [facility], [place] [address] [place], [place] [address]    Report Status PENDING  Incomplete         Radiology Studies: CT ABDOMEN PELVIS WO CONTRAST  Result Date: 10/16/2021 CLINICAL DATA:  Acute pancreatitis.  Mental status change. EXAM: CT ABDOMEN AND PELVIS WITHOUT CONTRAST TECHNIQUE: Multidetector CT imaging of the abdomen and pelvis was performed following the standard protocol without IV contrast. COMPARISON:  CT abdomen and pelvis 07/07/2020. FINDINGS: Lower chest: No acute abnormality. 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 The largest is in the left lobe measuring 3.3 cm. These appear unchanged. No definite new liver lesions are identified. Gallbladder and bile ducts are within normal limits. Pancreas: There is questionable mild peripancreatic inflammation. Pancreas is  otherwise grossly within normal limits, but not well delineated on this noncontrast study. Spleen: Normal in size without focal abnormality. Adrenals/Urinary Tract: Adrenal glands are unremarkable. Kidneys are normal, without renal calculi, focal lesion, or hydronephrosis. Bladder is unremarkable. Stomach/Bowel: There is distal esophageal wall thickening. Stomach moderately distended with air-fluid level. Limits. Appendix is not visualized. No evidence of bowel wall thickening, distention, or inflammatory changes. There is a large amount of stool throughout the colon. Vascular/Lymphatic: No significant vascular findings are present. No enlarged abdominal or pelvic lymph nodes. Reproductive: Prostate is unremarkable. Other: No abdominal wall hernia or abnormality. No focal fluid collections. No abdominopelvic ascites. Musculoskeletal: There are chronic changes of the left hip similar to the prior study. No acute fracture. IMPRESSION: 1. Can not exclude mild diffuse peripancreatic inflammation. No obvious fluid collections. 2. Distal esophageal wall thickening concerning for esophagitis. 3. Stomach is distended with air-fluid level. 4. Large stool burden. Electronically Signed   By: Ronney Asters M.D.   On: 10/16/2021 18:17   CT Head Wo Contrast  Result Date: 10/16/2021 CLINICAL DATA:  Altered mental status EXAM: CT HEAD WITHOUT CONTRAST TECHNIQUE: Contiguous axial images were obtained from the base of the skull through the vertex without intravenous contrast. COMPARISON:  Head CT dated 04/24/2016. 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 Similar appearance of the fourth ventricular size. No acute intracranial hemorrhage. No midline shift. Vascular: No hyperdense vessel or unexpected calcification. Skull: Normal. Negative for fracture or focal lesion. Sinuses/Orbits: No acute finding. Other: None IMPRESSION: 1.  No acute intracranial pathology. 2. 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 Electronically Signed   By: Anner Crete M.D.   On: 10/16/2021 18:15   DG Chest Portable 1 View  Result Date: 10/16/2021 CLINICAL DATA:  Altered mental status. EXAM: PORTABLE CHEST 1 VIEW COMPARISON:  04/24/2016. FINDINGS: Trachea is midline. Heart size normal. Lungs are clear. No pleural fluid. Nipple shadow projects over the lower left hemithorax. Stool is seen in the distal ascending, transverse and proximal descending colon. Gaseous distension of the stomach. Dextroconvex scoliosis. IMPRESSION: 1. No acute findings in the chest. 2. Stool in the visualized portions of the colon is indicative of constipation. Electronically Signed   By: Lorin Picket M.D.   On: 10/16/2021 15:22     Scheduled Meds:  chlorhexidine gluconate (MEDLINE KIT)  15 mL Mouth Rinse BID   heparin  5,000 Units Subcutaneous Q8H   insulin aspart  0-9 Units Subcutaneous TID WC   lactulose  300 mL Rectal Daily   mouth rinse  15 mL Mouth Rinse 10 times per day   pantoprazole (PROTONIX) IV  40 mg Intravenous Q24H   Continuous Infusions:  sodium chloride 200 mL/hr at 10/17/21 0717   cefTRIAXone (ROCEPHIN)  IV Stopped (10/16/21 2156)   lacosamide (VIMPAT) IV Stopped (10/16/21 2346)   levETIRAcetam       LOS: 1 day   Little Ishikawa, DO [facility] Pager: Secure chat  If 7PM-7AM, please contact night-coverage [email or web address]  10/17/2021, 8:02 AM

## 2021-10-17 NOTE — ED Notes (Signed)
Patient repositioned onto right side

## 2021-10-18 ENCOUNTER — Encounter (HOSPITAL_COMMUNITY)
Admission: EM | Disposition: A | Discharge status: Home / Self Care | Payer: Self-pay | Source: Home / Self Care | Attending: Internal Medicine

## 2021-10-18 ENCOUNTER — Inpatient Hospital Stay (HOSPITAL_COMMUNITY): Admitting: General Practice

## 2021-10-18 ENCOUNTER — Encounter (HOSPITAL_COMMUNITY): Payer: Self-pay | Admitting: Internal Medicine

## 2021-10-18 DIAGNOSIS — R569 Unspecified convulsions: Secondary | ICD-10-CM | POA: Diagnosis not present

## 2021-10-18 DIAGNOSIS — K2990 Gastroduodenitis, unspecified, without bleeding: Secondary | ICD-10-CM | POA: Diagnosis not present

## 2021-10-18 DIAGNOSIS — K921 Melena: Secondary | ICD-10-CM | POA: Diagnosis not present

## 2021-10-18 DIAGNOSIS — K625 Hemorrhage of anus and rectum: Secondary | ICD-10-CM | POA: Diagnosis not present

## 2021-10-18 DIAGNOSIS — D62 Acute posthemorrhagic anemia: Secondary | ICD-10-CM | POA: Diagnosis not present

## 2021-10-18 DIAGNOSIS — K2970 Gastritis, unspecified, without bleeding: Secondary | ICD-10-CM

## 2021-10-18 DIAGNOSIS — R4182 Altered mental status, unspecified: Secondary | ICD-10-CM | POA: Diagnosis not present

## 2021-10-18 DIAGNOSIS — R933 Abnormal findings on diagnostic imaging of other parts of digestive tract: Secondary | ICD-10-CM

## 2021-10-18 HISTORY — PX: ESOPHAGOGASTRODUODENOSCOPY (EGD) WITH PROPOFOL: SHX5813

## 2021-10-18 HISTORY — PX: FLEXIBLE SIGMOIDOSCOPY: SHX5431

## 2021-10-18 HISTORY — PX: BIOPSY: SHX5522

## 2021-10-18 LAB — CBC
HCT: 34.5 % — ABNORMAL LOW (ref 39.0–52.0)
Hemoglobin: 10.4 g/dL — ABNORMAL LOW (ref 13.0–17.0)
MCH: 24.7 pg — ABNORMAL LOW (ref 26.0–34.0)
MCHC: 30.1 g/dL (ref 30.0–36.0)
MCV: 81.9 fL (ref 80.0–100.0)
Platelets: 95 K/uL — ABNORMAL LOW (ref 150–400)
RBC: 4.21 MIL/uL — ABNORMAL LOW (ref 4.22–5.81)
RDW: 13.7 % (ref 11.5–15.5)
WBC: 7.6 K/uL (ref 4.0–10.5)
nRBC: 0.3 % — ABNORMAL HIGH (ref 0.0–0.2)

## 2021-10-18 LAB — GLUCOSE, CAPILLARY
Glucose-Capillary: 102 mg/dL — ABNORMAL HIGH (ref 70–99)
Glucose-Capillary: 88 mg/dL (ref 70–99)
Glucose-Capillary: 78 mg/dL (ref 70–99)
Glucose-Capillary: 91 mg/dL (ref 70–99)

## 2021-10-18 LAB — URINE CULTURE: Culture: NO GROWTH

## 2021-10-18 LAB — HEMOGLOBIN AND HEMATOCRIT, BLOOD
HCT: 30.8 % — ABNORMAL LOW (ref 39.0–52.0)
HCT: 29 % — ABNORMAL LOW (ref 39.0–52.0)
Hemoglobin: 9.6 g/dL — ABNORMAL LOW (ref 13.0–17.0)
Hemoglobin: 8.8 g/dL — ABNORMAL LOW (ref 13.0–17.0)

## 2021-10-18 LAB — BASIC METABOLIC PANEL WITH GFR
Anion gap: 5 (ref 5–15)
BUN: 30 mg/dL — ABNORMAL HIGH (ref 6–20)
CO2: 23 mmol/L (ref 22–32)
Calcium: 7.7 mg/dL — ABNORMAL LOW (ref 8.9–10.3)
Chloride: 124 mmol/L — ABNORMAL HIGH (ref 98–111)
Creatinine, Ser: 0.83 mg/dL (ref 0.61–1.24)
GFR, Estimated: >60 mL/min
Glucose, Bld: 85 mg/dL (ref 70–99)
Potassium: 3.4 mmol/L — ABNORMAL LOW (ref 3.5–5.1)
Sodium: 152 mmol/L — ABNORMAL HIGH (ref 135–145)

## 2021-10-18 LAB — LEVETIRACETAM LEVEL: Levetiracetam Lvl: 42.9 ug/mL — ABNORMAL HIGH (ref 10.0–40.0)

## 2021-10-18 LAB — LAMOTRIGINE LEVEL: Lamotrigine Lvl: 9.9 ug/mL (ref 2.0–20.0)

## 2021-10-18 SURGERY — ESOPHAGOGASTRODUODENOSCOPY (EGD) WITH PROPOFOL
Anesthesia: Monitor Anesthesia Care

## 2021-10-18 MED ORDER — LACOSAMIDE 50 MG PO TABS
200.0000 mg | ORAL_TABLET | Freq: Two times a day (BID) | ORAL | Status: DC
  Administered 2021-10-19 – 2021-10-31 (×25): 200 mg via ORAL
  Filled 2021-10-18 (×27): qty 4

## 2021-10-18 MED ORDER — PANTOPRAZOLE SODIUM 40 MG IV SOLR
40.0000 mg | Freq: Two times a day (BID) | INTRAVENOUS | Status: DC
  Administered 2021-10-18 – 2021-10-21 (×6): 40 mg via INTRAVENOUS
  Filled 2021-10-18 (×6): qty 40

## 2021-10-18 MED ORDER — SODIUM CHLORIDE 0.9 % IV SOLN: INTRAVENOUS | Status: DC | PRN

## 2021-10-18 MED ORDER — POLYETHYLENE GLYCOL 3350 17 G PO PACK
17.0000 g | PACK | Freq: Every day | ORAL | Status: DC
  Administered 2021-10-18 – 2021-10-31 (×8): 17 g via ORAL
  Filled 2021-10-18 (×13): qty 1

## 2021-10-18 MED ORDER — LEVETIRACETAM 100 MG/ML PO SOLN
750.0000 mg | Freq: Two times a day (BID) | ORAL | Status: DC
  Administered 2021-10-18 – 2021-10-31 (×26): 750 mg via ORAL
  Filled 2021-10-18 (×27): qty 10

## 2021-10-18 MED ORDER — LAMOTRIGINE 25 MG PO TABS
225.0000 mg | ORAL_TABLET | Freq: Two times a day (BID) | ORAL | Status: DC
  Administered 2021-10-18 – 2021-10-31 (×26): 225 mg via ORAL
  Filled 2021-10-18 (×29): qty 1

## 2021-10-18 MED ORDER — PHENYLEPHRINE 40 MCG/ML (10ML) SYRINGE FOR IV PUSH (FOR BLOOD PRESSURE SUPPORT)
PREFILLED_SYRINGE | INTRAVENOUS | Status: DC | PRN
  Administered 2021-10-18 (×3): 40 ug via INTRAVENOUS

## 2021-10-18 MED ORDER — LEVETIRACETAM 750 MG PO TABS
750.0000 mg | ORAL_TABLET | Freq: Two times a day (BID) | ORAL | Status: DC
  Filled 2021-10-18: qty 1

## 2021-10-18 MED ORDER — WHITE PETROLATUM EX OINT
TOPICAL_OINTMENT | CUTANEOUS | Status: AC
  Filled 2021-10-18: qty 28.35

## 2021-10-18 MED ORDER — PROPOFOL 500 MG/50ML IV EMUL
INTRAVENOUS | Status: DC | PRN
  Administered 2021-10-18: 100 ug/kg/min via INTRAVENOUS

## 2021-10-18 MED ORDER — PROPOFOL 10 MG/ML IV BOLUS
INTRAVENOUS | Status: DC | PRN
  Administered 2021-10-18 (×2): 20 mg via INTRAVENOUS

## 2021-10-18 SURGICAL SUPPLY — 15 items

## 2021-10-18 NOTE — Evaluation (Addendum)
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  Carlos [name] has had poor PO intake for at least 3 days PTA .  15 lb weight loss this year.  CXR and Head CT 11/30 with no acute findings.  Carlos [name] with concerns for GIB following bloody BM morning of 12/2.    Assessment / Plan / Recommendation  Clinical Impression  Carlos [name] presents with moderate oral dysphagia and hx of mild-moderate pharyngeal deficits.  Carlos [name] is known to this service from prior admissions with most recent MBS 07/10/20 with recommendations for puree and thin liquid with transient penetration of thin liquids noted.  Carlos [name] consumes a puree diet with thin liquids at baseline per chart review; no family present during today's evaluation.  Carlos [name]'s CXR 11/30 without acute findings.  Xerostomia noted on arrival.  SLP provided oral care prior to PO trials.  Carlos [name] oral responsive but not overtly defensive to oral care.  With thermal stimulation to lips, Carlos [name] did not exhibit oral response.  A small amount of water was placed by spoon in oral cavity.  There was  oral spillage 2/2 poor labial seal.  Carlos [name] exhibited multiple, audible swallows.  With cup presentation there was significant anterior loss and increased number of swallows.  Carlos [name] appeared eager for POs, turning head and exhibiting oral groping and lingual pumping.  With puree, there was inefficent oral phase and poor labial seal.  There was intralabial escape of purees.  There was poor oral transit and piecemeal deglutition 2/2 lingual discoordination.  Carlos [name] required multiple swallows. There was moderate oral residue.  Carlos [name] benefited from liquid wash and from reintroduction of empty spoon to renew oral response.  Suction also used to reduce oral residuals.    Recommend puree diet with thin liquid by spoon when Carlos [name] is medically appropriate for POs, with aspiration precautions as noted below.  There is concern for possible GIB and diet orders will not be placed at this time.    Carlos [name] may not be able to meet nutritional needs orally, espcially considering recent weightloss and decreased PO intake.  Consider temporary AMN if indicated for nutritional support.   SLP Visit Diagnosis: Dysphagia, oropharyngeal phase (R13.12)    Aspiration Risk  Mild aspiration risk    Diet Recommendation Dysphagia 1 (Puree);Thin liquid   Liquid Administration via: Spoon  Medication Administration: Crushed with puree OR via alternate means  Supervision:  1:1 assistance with all PO intake.  Compensations:  Slow rate; Small sips/bites; Follow solids with liquid; Bring head to neutral, midline position;  Place  empty spoon in oral cavity to renew oral response to clear residue;  Have suction available;  Oral care following PO intake  Postural Changes: Seated upright at 90 degrees    Other  Recommendations Oral Care Recommendations: Oral care QID Other Recommendations: Have oral suction available    Recommendations for follow up therapy are one component of a multi-disciplinary discharge planning process, led by the  attending physician.  Recommendations may be updated based on patient status, additional functional criteria and insurance authorization.  Follow up Recommendations  (TBD)      Assistance Recommended at Discharge  (TBD)  Functional Status Assessment Patient has had a recent decline in their functional status and/or demonstrates limited ability to make significant improvements in function in a reasonable and predictable amount of time  Frequency and Duration min 2x/week  2 weeks       Prognosis Prognosis for Safe Diet Advancement:  (N/A) Barriers to Reach Goals:  (PLOF)      Swallow Study   General Date of Onset: 10/16/21 HPI: Carlos [name] is a 35 y.o. 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  Carlos [name] has had poor PO intake for at least 3 days PTA .  15 lb weight loss this year.  CXR and Head CT 11/30 with no acute findings.  Carlos [name] with concerns for GIB following bloody BM morning of 12/2. Type of Study: Bedside Swallow Evaluation Previous Swallow Assessment: MBSS 07/07/20 with recs for puree/thin Diet Prior to this Study: NPO Temperature Spikes Noted: No Respiratory Status: Room air History of Recent Intubation: No Behavior/Cognition: Lethargic/Drowsy Oral Cavity Assessment: Dry;Dried secretions Oral Care Completed by SLP: Yes Oral Cavity - Dentition: Adequate natural dentition Vision: Impaired for self-feeding Self-Feeding Abilities: Total assist Patient Positioning: Upright in bed Baseline Vocal Quality: Not observed Volitional Cough: Cognitively unable to elicit Volitional Swallow: Unable to elicit    Oral/Motor/Sensory Function Overall Oral Motor/Sensory Function:  (Unable to assess) Facial Symmetry: Within Functional Limits   Ice Chips Ice chips: Impaired Other Comments: Absent oral response   Thin Liquid Thin Liquid:  Impaired Presentation: Cup;Spoon Oral Phase Functional Implications: Right anterior spillage;Left anterior spillage Pharyngeal  Phase Impairments: Multiple swallows    Nectar Thick Nectar Thick Liquid: Not tested   Honey Thick Honey Thick Liquid: Not tested   Puree Puree: Impaired Oral Phase Impairments: Reduced lingual movement/coordination;Reduced labial seal;Poor awareness of bolus Oral Phase Functional Implications: Oral residue;Prolonged oral transit Pharyngeal Phase Impairments: Multiple swallows   Solid     Solid: Not tested      Kerrie Pleasure, MA, CCC-SLP Acute Rehabilitation Services Office: [phone] 10/18/2021,10:40 AM

## 2021-10-18 NOTE — Anesthesia Procedure Notes (Addendum)
Procedure Name: MAC Date/Time: 10/18/2021 1:00 PM Performed by: Ardyth Harps, CRNA Pre-anesthesia Checklist: Patient identified, Emergency Drugs available, Suction available and Patient being monitored Oxygen Delivery Method: Nasal cannula Induction Type: IV induction Placement Confirmation: positive ETCO2

## 2021-10-18 NOTE — Progress Notes (Signed)
Neurology Progress Note  Brief HPI: 35 y.o. 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 He was lethargic on 11/29 and had one seizure that resolved spontaneously after a few minutes. He had a second seizure on 11/30 with decreased responsiveness following the seizure. At baseline, patient will respond with head and eye movement to speech but was altered after his seizure on 11/30. Patient's family also reported an approximate 15 pound weight loss over the past 12 months. Initial work up revealed patient to be hypotensive, tachycardic, with low-grade fever with negative chest x-ray imaging and CTH. Blood work showed hypernatremia with sodium of 159, creatinine 2.7, BUN 113 and elevated lactate of 5. Patient was given a 1500 mg Keppra load in the ER and neurology was consulted for further evaluation.  Subjective: No acute overnight events noted. No family at bedside during exam. Patient is alert awake, appears to be at baseline  Exam: Vitals:   10/18/21 0718 10/18/21 0921  BP: 96/77 100/77  Pulse: (!) 102 (!) 103  Resp: 16 17  Temp: 98 F (36.7 C) 97.6 F (36.4 C)  SpO2: 97% 100%   Gen: Laying in hospital bed, eyes open and awake in NAD Resp: non-labored breathing, no respiratory distress, SpO2 100% on room air Abd: soft, non-distended  Neuro: Mental Status: Laying in bed with eyes open and awake, does turn head to me most times He is nonverbal at baseline, does not follow commands.  No evidence of neglect noted.  Cranial Nerves: Left pupil is round, 5 mm and briskly reactive to light, right pupil is 3 mm and briskly reactive to light, gaze is dysconjugate, patient resists examiner eye opening, oculocephalic reflex is intact, hearing is intact to  voice.  Motor: Withdraws or attempts to withdraw with contractures throughout. Does not follow commands.  Bulk is decreased throughout.  Sensory: Withdraws or attempts to withdraw in all 4 extremities without focality Gait: Deferred  Pertinent Labs: CBC    Component Value Date/Time   WBC 7.6 10/18/2021 0414   RBC 4.21 (L) 10/18/2021 0414   HGB 9.6 (L) 10/18/2021 1025   HCT 30.8 (L) 10/18/2021 1025   PLT 95 (L) 10/18/2021 0414   MCV 81.9 10/18/2021 0414   MCH 24.7 (L) 10/18/2021 0414   MCHC 30.1 10/18/2021 0414   RDW 13.7 10/18/2021 0414   LYMPHSABS 1.6 10/16/2021 1456   MONOABS 0.8 10/16/2021 1456   EOSABS 0.1 10/16/2021 1456   BASOSABS 0.0 10/16/2021 1456   CMP     Component Value Date/Time   NA 152 (H) 10/18/2021 0414   K 3.4 (L) 10/18/2021 0414   CL 124 (H) 10/18/2021 0414   CO2 23 10/18/2021 0414   GLUCOSE 85 10/18/2021 0414   BUN 30 (H) 10/18/2021 0414   CREATININE 0.83 10/18/2021 0414   CREATININE 0.88 04/06/2017 1045   CALCIUM 7.7 (L) 10/18/2021 0414   PROT 5.5 (L) 10/17/2021 0548   ALBUMIN 2.2 (L) 10/17/2021 0548   AST 29 10/17/2021 0548   ALT 10 10/17/2021 0548   ALKPHOS 40 10/17/2021 0548   BILITOT 0.5 10/17/2021 0548   GFRNONAA >60 10/18/2021 0414   GFRAA >60 07/09/2020 0210   Urinalysis    Component Value Date/Time   COLORURINE YELLOW 10/17/2021 0900   APPEARANCEUR CLEAR 10/17/2021 0900   LABSPEC 1.020  10/17/2021 0900   PHURINE 5.5 10/17/2021 0900   GLUCOSEU NEGATIVE 10/17/2021 0900   HGBUR MODERATE (A) 10/17/2021 0900   BILIRUBINUR NEGATIVE 10/17/2021 0900   KETONESUR NEGATIVE 10/17/2021 0900   PROTEINUR NEGATIVE 10/17/2021 0900   UROBILINOGEN 0.2 03/06/2013 1354   NITRITE NEGATIVE 10/17/2021 0900   LEUKOCYTESUR NEGATIVE 10/17/2021 0900   Lab Results  Component Value Date   CHOL 141 10/17/2021   HDL 14 (L) 10/17/2021   LDLCALC 87 10/17/2021   TRIG 201 (H) 10/17/2021   CHOLHDL 10.1 10/17/2021    Latest Reference Range & Units 10/16/21  20:46  HIV Screen 4th Generation wRfx Non Reactive  Non Reactive    Latest Reference Range & Units 10/16/21 20:46  Lactic Acid, Venous 0.5 - 1.9 mmol/L 4.1 (HH)    Latest Reference Range & Units 10/16/21 20:46  Ammonia 9 - 35 umol/L 46 (H)   Results for orders placed or performed during the hospital encounter of 10/16/21  Resp Panel by RT-PCR (Flu A&B, Covid) Nasopharyngeal Swab     Status: None   Collection Time: 10/16/21  2:54 PM   Specimen: Nasopharyngeal Swab; Nasopharyngeal(NP) swabs in vial transport medium  Result Value Ref Range Status   SARS Coronavirus 2 by RT PCR NEGATIVE NEGATIVE Final    Comment: (NOTE) SARS-CoV-2 target nucleic acids are NOT DETECTED.  The SARS-CoV-2 RNA is generally detectable in upper respiratory specimens during the acute phase of infection. The lowest concentration of SARS-CoV-2 viral copies this assay can detect is 138 copies/mL. A negative result does not preclude SARS-Cov-2 infection and should not be used as the sole basis for treatment or other patient management decisions. A negative result may occur with  improper specimen collection/handling, submission of specimen other than nasopharyngeal swab, presence of viral mutation(s) within the areas targeted by this assay, and inadequate number of viral copies(<138 copies/mL). A negative result must be combined with clinical observations, patient history, and epidemiological information. The expected result is Negative.  Fact Sheet for Patients:  BloggerCourse.com  Fact Sheet for Healthcare Providers:  SeriousBroker.it  This test is no t yet approved or cleared by the [place] FDA and  has been authorized for detection and/or diagnosis of SARS-CoV-2 by FDA under an Emergency Use Authorization (EUA). This EUA will remain  in effect (meaning this test can be used) for the duration of the COVID-19 declaration under Section 564(b)(1) of the  Act, 21 U.S.C.section 360bbb-3(b)(1), unless the authorization is terminated  or revoked sooner.       Influenza A by PCR NEGATIVE NEGATIVE Final   Influenza B by PCR NEGATIVE NEGATIVE Final    Comment: (NOTE) The Xpert Xpress SARS-CoV-2/FLU/RSV plus assay is intended as an aid in the diagnosis of influenza from Nasopharyngeal swab specimens and should not be used as a sole basis for treatment. Nasal washings and aspirates are unacceptable for Xpert Xpress SARS-CoV-2/FLU/RSV testing.  Fact Sheet for Patients: BloggerCourse.com  Fact Sheet for Healthcare Providers: SeriousBroker.it  This test is not yet approved or cleared by the [place] FDA and has been authorized for detection and/or diagnosis of SARS-CoV-2 by FDA under an Emergency Use Authorization (EUA). This EUA will remain in effect (meaning this test can be used) for the duration of the COVID-19 declaration under Section 564(b)(1) of the Act, 21 U.S.C. section 360bbb-3(b)(1), unless the authorization is terminated or revoked.  Performed at [facility], [address], [place], [place] [address]   Blood Culture (routine x 2)  Status: None (Preliminary result)   Collection Time: 10/16/21  4:14 PM   Specimen: BLOOD  Result Value Ref Range Status   Specimen Description BLOOD RIGHT ANTECUBITAL  Final   Special Requests   Final    BOTTLES DRAWN AEROBIC ONLY Blood Culture adequate volume   Culture   Final    NO GROWTH 2 DAYS Performed at [facility], [address], [place], [place] [address]    Report Status PENDING  Incomplete  Urine Culture     Status: None   Collection Time: 10/17/21  3:20 AM   Specimen: In/Out Cath Urine  Result Value Ref Range Status   Specimen Description IN/OUT CATH URINE  Final   Special Requests NONE  Final   Culture   Final    NO GROWTH Performed at [facility], [address], [place], [place] [address]     Report Status 10/18/2021 FINAL  Final  MRSA Next Gen by PCR, Nasal     Status: None   Collection Time: 10/17/21  9:01 AM   Specimen: Nasal Mucosa; Nasal Swab  Result Value Ref Range Status   MRSA by PCR Next Gen NOT DETECTED NOT DETECTED Final    Comment: (NOTE) The GeneXpert MRSA Assay (FDA approved for NASAL specimens only), is one component of a comprehensive MRSA colonization surveillance program. It is not intended to diagnose MRSA infection nor to guide or monitor treatment for MRSA infections. Test performance is not FDA approved in patients less than 22 years old. Performed at [facility], [address] [place], [place] [address]    Imaging Reviewed:  EEG 11/30: "This study is suggestive of severe diffuse encephalopathy, nonspecific etiology. 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  - Unclear if he has been given all of his anticonvulsant medications as directed due to regular family caretaker being out of town and alternate family member taking care of patient, not being the primary caregiver per chart review. - Also noted to be mildly febrile, hypernatremic, lactic acidosis along with large stool burden in his abdomen. - Likely breakthrough seizures in the setting of metabolic abnormalities, EEG obtained and is suggestive of severe diffuse encephalopathy of nonspecific etiology without evidence of seizures or definite epileptiform discharges.    Recommendations: - Keppra level mildly elevated at 42.9. Most likely elevated due to high dose at home, which  has been reduced to renal dosing at 750 mg BID. Keppra also changed back from IV to PO. Obtain repeat Keppra level now (ordered).  - Lamotrigine level pending - Continue home antiepileptics: Keppra 750 twice daily given deranged renal function, Vimpat 200 mg twice daily- Will change IV to PO now that he is able to take a diet  - Can restart Lamictal since has been cleared for a  Dysphagia 1 (Puree) thin liquid diet   - Aggressive management of constipation. -Management of toxic/metabolic derangements including hyperammonemia, hypernatremia (elevated Na levels must be slowly corrected at no more than 0.5 meq/L per hour to avoid osmotic demyelination syndrome) and lactic acidosis including search for a cause for this.  Low suspicion for CNS infection at this time-do not need  antibiotic coverage for meningitic or encephalitic etiology. - Avoid cefepime as it can cause neurotoxicity. - Seizure precautions  Neurology will sign off,  please call for questions or concerns  Gevena Mart, NP  Electronically signed: Dr. Caryl Pina

## 2021-10-18 NOTE — Consult Note (Addendum)
Attending physician's note   I have taken an interval history, reviewed the chart and examined the patient. I agree with the Advanced Practitioner's note, impression, and recommendations as outlined.   35 year old male with medical history as outlined below to include history of cerebral palsy, quadriplegia, nonverbal, seizure disorder, admitted with AMS and multiple seizures.  Patient unable to provide any history and unfortunately mother also admitted to the ICU currently.  I tried calling his father, but no answer.  I left a voicemail with a callback number.  GI service was contacted for large volume bloody stools earlier today.  Constipation noted on admission CT and was treated with enema yesterday with good results, but then bloody stool today prior to repeat enemas.  Exam today with small hemorrhoids but no obvious anal fissure.  Small amount of blood in diaper; patient was just cleaned up prior to exam.  H/H continues to slightly downtrend from 14.6 --> 11.6 --> 10.4 earlier today--> 9.6 after bloody stool.  BUN: Creatinine ratio elevated at 30: 0.83, but also in the setting of resolving AKI (was 112/2.7 on 11/30).  Admission CT also with distal esophageal wall thickening and moderately distended stomach as well as large amount of stool throughout the colon.  1) Acute blood loss anemia 2) Hematochezia 3) Esophageal thickening on CT 4) Stool retention/constipation on CT - Plan for expedited EGD and flexible sigmoidoscopy today for diagnostic and therapeutic intent - Continue H/H trend with blood products as needed per protocol - Started on PPI - Have attempted to call patient's father to obtain consent.  No answer, voicemail left.  Will try again.  His mother is currently admitted to the ICU.    5) Pancreatitis on CT - Admission CT with questionable mild peripancreatic inflammation.  Lipase slightly elevated 186, but downtrending yesterday 117.  No abdominal tenderness on exam and  patient otherwise nonverbal - He is n.p.o. today for procedures  6) Cerebral Palsy 7) Quadriplegia 8) Nonverbal 9) AMS 10) Seizures - Seizure management per Neurology service - After procedures, plan for resuming p.o. intake per consulting Speech Pathology  EDIT: I was able to speak with the patient's father, Carlos [name], on the phone today.  Discussed Bud's current clinical situation.  We discussed the pros/cons of endoscopic evaluation to include the risks, benefits, alternatives of EGD and flexible sigmoidoscopy.  His father provides verbal consent over the phone to proceed with EGD and flexible sigmoidoscopy today for diagnostic and potentially therapeutic intent.  Of note, Carlos [name] relates that does have a history of constipation, but otherwise had actually been doing quite well with this at home with daily MiraLAX.  He will sometimes have to use a suppository, but that is only every 3-4 months.  Otherwise last time he has used an enema was more than a year ago.  [address], DO, FACG [phone] office         Consultation Referring MD; [facility] MD Primary Care Physician:  Pcp, No Primary Gastroenterologist: None   Reason for Consultation: Acute rectal bleeding  HPI: Carlos [name] is a 35 y.o. male with history of cerebral palsy, baseline quadriplegia, nonverbal, seizure disorder who was brought to the hospital 2 days ago with altered mental status and multiple seizures.  Frequent breakthrough seizures at home.  Patient's mother currently admitted to the hospital herself, and father had been out of town.  Patient had been apparently missing his home medications.  We are asked to evaluate today acute onset earlier  this morning with a large volume of grossly bloody stool per rectum.  Patient has been constipated and CT imaging on admission showed a large volume of stool in the colon.  He did receive an enema yesterday and had good results.  Bowel movement this morning  was before enemas were given.  Nurse reports grossly bloody and dark blood, no melena 1 episode thus far By review of the chart, patient has not had any prior GI issues, no previous procedures.  He did have CT of the abdomen pelvis done on admission which shows probable mild peripancreatic inflammation, distal esophageal wall thickening concerning for esophagitis, some distention of the stomach with air-fluid levels and large stool burden.  Labs today WBC 7.6/hemoglobin 10.4/hematocrit 34.5/platelets 95-hemoglobin down from 13.5 on admission, 11.6 yesterday Potassium 3.4/BUN 30/creatinine 0.8.  Patient dehydrated on admission.  Hyponatremia, hemoconcentration and AKI are resolving. Patient is not on any blood thinners, no NSAIDs at home.  Unable to offer any history, and family unavailable presently.  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 Authorizing Provider  cetirizine HCl (ZYRTEC) 5 MG/5ML SOLN Take 10 mLs (10 mg total) by mouth daily as needed for itching. 08/18/17  Yes Nicolette Bang, MD  Lacosamide 100 MG TABS Take 100-200 mg by mouth See admin instructions. 100 mg in the morning 200 mg at bedtime   Yes [provider]  lamoTRIgine (LAMICTAL) 200 MG tablet Take 200 mg by mouth 2 (two) times daily.   Yes [provider]  lamoTRIgine (LAMICTAL) 25 MG tablet Take 25 mg by mouth 2 (two) times daily.   Yes [provider]  levETIRAcetam (KEPPRA) 750 MG tablet Take 1,500 mg by mouth 2 (two) times daily.   Yes [provider]  polyethylene glycol (MIRALAX / GLYCOLAX) 17 g packet Take 17 g by mouth daily as needed for mild constipation.   Yes [provider]  sodium phosphate Pediatric (FLEET) 3.5-9.5 GM/59ML enema Place 1 enema rectally every three (3) days as needed for constipation.   Yes [provider]  triamcinolone cream (KENALOG) 0.1 % Apply small amount to rash twice per day Patient taking differently: Apply 1 application topically 2 (two) times daily. 06/30/18  Yes Goodpasture, Otila Kluver, NP  KEPPRA 750 MG tablet Take 2 tablets (1,500 mg total) by mouth 2 (two) times daily. Patient not taking: Reported on 10/16/2021 09/16/21   Rockwell Germany, NP  lactulose [facility]) 10 GM/15ML solution Give 72m by mouth twice per day Patient not taking: Reported on 10/16/2021 07/12/19   GRockwell Germany NP  LAMICTAL 200 MG tablet TAKE 1 TABLET BY MOUTH TWICE A DAY Patient not taking: Reported on 10/16/2021 09/16/21   GRockwell Germany NP  LAMICTAL 25 MG tablet TAKE 1 TABLET BY MOUTH TWICE A DAY Patient not taking: Reported on 10/16/2021 09/16/21   GRockwell Germany NP  VIMPAT 100 MG TABS TAKE 1 TABLET EVERY DAY IN THE MORNING TAKE 2 TABLETS IN THE EVENING Patient not taking: Reported on 10/16/2021 09/16/21   Rockwell Germany, NP    Current Facility-Administered Medications  Medication Dose Route Frequency Provider Last Rate Last Admin   0.45 % sodium chloride infusion   Intravenous Continuous Lovey Newcomer T, NP 100 mL/hr at 10/17/21 2243 New Bag at 10/17/21 2243   chlorhexidine gluconate (MEDLINE KIT) (PERIDEX) 0.12 % solution 15 mL  15 mL Mouth Rinse BID Wynetta Fines T, MD   15 mL at 10/18/21 4580   Chlorhexidine Gluconate Cloth 2 % PADS 6 each  6 each Topical Daily Wynetta Fines T, MD   6 each at 10/18/21 1012   insulin aspart (novoLOG) injection 0-9 Units  0-9 Units  Subcutaneous TID WC Wynetta Fines T, MD       lacosamide (VIMPAT) 200 mg in sodium chloride 0.9 % 25 mL IVPB  200 mg Intravenous Q12H Amie Portland, MD 90 mL/hr at 10/17/21 2244 200 mg at 10/17/21 2244   lactulose (CHRONULAC) enema 200 gm  300 mL Rectal Daily Wynetta Fines T, MD   300 mL at 10/17/21 1238   levETIRAcetam (KEPPRA) 750 mg in sodium chloride 0.9 % 100 mL IVPB  750 mg Intravenous Q12H Amie Portland, MD 430 mL/hr at 10/18/21 0756 750 mg at 10/18/21 0756   LORazepam (ATIVAN) injection 4 mg  4 mg Intravenous Q5 Min x 2 PRN Lequita Halt, MD       MEDLINE mouth rinse  15 mL Mouth Rinse 10 times per day Wynetta Fines T, MD   15 mL at 10/18/21 0629   ondansetron (ZOFRAN) tablet 4 mg  4 mg Oral Q6H PRN Lequita Halt, MD       Or   ondansetron [facility]) injection 4 mg  4 mg Intravenous Q6H PRN Wynetta Fines T, MD       pantoprazole (PROTONIX) injection 40 mg  40 mg Intravenous Q24H Wynetta Fines T, MD   40 mg at 10/17/21 1803    Allergies as of 10/16/2021   (No Known Allergies)    Family History  Problem Relation Age of Onset   Cancer Paternal Grandfather        Died at 62    Social History   Socioeconomic History   Marital status: Single    Spouse name: Not on file   Number of children: Not on file   Years of education: Not on file   Highest education level: Not on file  Occupational History   Not on file  Tobacco Use   Smoking status: Passive Smoke Exposure - Never Smoker   Smokeless tobacco: Never  Substance and Sexual Activity   Alcohol use: No    Alcohol/week: 0.0 standard drinks   Drug use: No   Sexual activity: Never  Other Topics Concern   Not on file  Social History Narrative   Suhan attends [employer].    He enjoys playing with toys and watching television.   Lives with his parents. He does not have any siblings.   Social Determinants of Health   Financial Resource Strain: Not on file  Food Insecurity: Not on file  Transportation Needs: Not on  file  Physical Activity: Not on file  Stress: Not on file  Social Connections: Not on file  Intimate Partner Violence: Not on file    Review of Systems: Pertinent positive and negative review of systems were noted  in the above HPI section.  All other review of systems was otherwise negative.   Physical Exam: Vital signs in last 24 hours: Temp:  [96.8 F (36 C)-99.1 F (37.3 C)] 97.6 F (36.4 C) (12/02 0921) Pulse Rate:  [70-107] 103 (12/02 0921) Resp:  [10-19] 17 (12/02 0921) BP: (91-117)/(60-86) 100/77 (12/02 0921) SpO2:  [97 %-100 %] 100 % (12/02 0921)   General:   Alert, chronically ill-very thin appearing small African-American male, opens eyes but nonverbal, mouth breathing Head:  Normocephalic and atraumatic. Eyes:  Sclera clear, no icterus.   Conjunctiva pink. Ears:  Normal auditory acuity. Nose:  No deformity, discharge,  or lesions. Mouth:  No deformity or lesions.   Neck:  Supple; no masses or thyromegaly. Lungs:  Clear throughout to auscultation.   No wheezes, crackles, or rhonchi.  Heart: tachy  Regular rate and rhythm; no murmurs, clicks, rubs,  or gallops. Abdomen:  Soft, no appreciable tenderness BS active,nonpalp mass or hsm.   Rectal: No external lesions noted, small amount of dark red blood  the perineum Msk: Lower extremities contractures.  Quadriplegia Pulses:  Normal pulses noted. Extremities:  Without clubbing or edema. Neurologic: Minimally responsive Skin: Bullous appearing lesions on both knees and lower extremities Psych: Nonverbal  Intake/Output from previous day: 12/01 0701 - 12/02 0700 In: 4056.1 [I.V.:3624.6; IV Piggyback:431.4] Out: 2300 [Urine:2300] Intake/Output this shift: No intake/output data recorded.  Lab Results: Recent Labs    10/16/21 1456 10/16/21 1504 10/17/21 0548 10/18/21 0414 10/18/21 1025  WBC 12.8*  --  11.2* 7.6  --   HGB 13.5   < > 11.6* 10.4* 9.6*  HCT 48.1   < > 39.7 34.5* 30.8*  PLT 100*  --  61* 95*  --     < > = values in this interval not displayed.   BMET Recent Labs    10/17/21 1521 10/17/21 1825 10/18/21 0414  NA 152* 152* 152*  K 4.7 4.4 3.4*  CL 121* 122* 124*  CO2 23 22 23   GLUCOSE 89 88 85  BUN 46* 40* 30*  CREATININE 0.75 0.72 0.83  CALCIUM 7.5* 7.4* 7.7*   LFT Recent Labs    10/17/21 0548  PROT 5.5*  ALBUMIN 2.2*  AST 29  ALT 10  ALKPHOS 40  BILITOT 0.5   PT/INR Recent Labs    10/16/21 1611  LABPROT 16.2*  INR 1.3*      IMPRESSION:  #63 35 year old African-American male with cerebral palsy, quadriplegia, nonverbal at baseline admitted with altered mental status and multiple seizures  #2 acute onset today of grossly bloody stool x1 .  Patient has been constipated, received enema yesterday. Hemoglobin down to 0.8 g since yesterday  Suspect anorectal source of bleeding, possible stercoral ulceration, proctitis, cannot rule out diverticular or other occult lesion.  Doubt upper GI source  #3 abnormal esophagus on CT with thickening in the distal esophagus #4 mild peripancreatic inflammation on imaging on admission, no appreciable abdominal tenderness, he did have elevated lipase, probably has had some mild pancreatitis  #5 malnutrition    PLAN: Keep n.p.o. Serial hemoglobins and transfuse for hemoglobin less than 8 Continue IV PPI, currently on once daily Discontinue enemas for now Patient will be scheduled for flexible sigmoidoscopy and upper endoscopy with Dr. Bryan Lemma later today.  Will need to discuss with patient's father, and have him sign permit. Further recommendations pending results of above.    Amy Esterwood PA-C 10/18/2021, 10:52 AM

## 2021-10-18 NOTE — Transfer of Care (Signed)
Immediate Anesthesia Transfer of Care Note  Patient: Carlos [name]  Procedure(s) Performed: ESOPHAGOGASTRODUODENOSCOPY (EGD) WITH PROPOFOL FLEXIBLE SIGMOIDOSCOPY  Patient Location: Endoscopy Unit  Anesthesia Type:MAC  Level of Consciousness: awake and drowsy  Airway & Oxygen Therapy: Patient Spontanous Breathing  Post-op Assessment: Report given to RN and Post -op Vital signs reviewed and stable  Post vital signs: Reviewed and stable  Last Vitals:  Vitals Value Taken Time  BP 104/61 10/18/21 1411  Temp    Pulse 89 10/18/21 1411  Resp 15 10/18/21 1411  SpO2 100 % 10/18/21 1411  Vitals shown include unvalidated device data.  Last Pain:  Vitals:   10/18/21 1253  TempSrc: Other (Comment)  PainSc:          Complications: No notable events documented.

## 2021-10-18 NOTE — Progress Notes (Signed)
RN called to bedside by tech, Pt found having large liquid BM. BM appeared bright red and maroon with clots. MD notified and aware. See new orders.

## 2021-10-18 NOTE — Op Note (Signed)
[facility] Patient Name: Carlos [name] Procedure Date : 10/18/2021 MRN: [id number] Attending MD: Doristine Locks , MD Date of Birth: Jul 11, 1986 CSN: [id number] Age: 35 Admit Type: Inpatient Procedure:                Flexible Sigmoidoscopy Indications:              Hematochezia, Acute blood loss anemia Providers:                Doristine Locks, MD, Janae Sauce. Steele Berg, RN, Alyce Pagan Technician, Technician, Harrington Challenger, Pensions consultant Referring MD:              Medicines:                Monitored Anesthesia Care Complications:            No immediate complications. Estimated Blood Loss:     Estimated blood loss: none. Procedure:                Pre-Anesthesia Assessment:                           - Prior to the procedure, a History and Physical                            was performed, and patient medications and                            allergies were reviewed. The patient's tolerance of                            previous anesthesia was also reviewed. The risks                            and benefits of the procedure and the sedation                            options and risks were discussed with the patient.                            All questions were answered, and informed consent                            was obtained. Prior Anticoagulants: The patient has                            taken no previous anticoagulant or antiplatelet                            agents. ASA Grade Assessment: III - A patient with                            severe systemic disease. After reviewing the risks  and benefits, the patient was deemed in                            satisfactory condition to undergo the procedure.                           After obtaining informed consent, the scope was                            passed under direct vision. The [id number]-HQ190L                            ([id number]) Olympus colonoscope was introduced                             through the anus and advanced to the the sigmoid                            colon. The flexible sigmoidoscopy was technically                            difficult and complex due to poor bowel prep. The                            quality of the bowel preparation was poor. Scope In: 1:51:19 PM Scope Out: 2:10:38 PM Total Procedure Duration: 0 hours 19 minutes 18 seconds  Findings:      The perianal and digital rectal examinations were normal.      Clotted blood and dark blood was found scattered throughout the       visualized rectum, recto-sigmoid colon and in the sigmoid colon. There       was no active bleeding or fresh blood noted, albeit on significantly       limited views.      Large amounts of stool mixed with blood was found in the rectum,       recto-sigmoid, and in the visualized sigmoid colon, interfering with       visualization. Lavage of the area was performed using copious amounts of       tap water, resulting in incomplete clearance with fair visualization.       Retroflexed views were significantly limited by stool. Impression:               - Preparation of the colon was poor.                           - Blood mixed in with stool noted throughout                            visualized rectum through mid sigmoid colon. The                            blood appeared old and no active bleeding was                            noted, albeit on significantly reduced  visualization due to stool burden.                           - No specimens collected. Recommendation:           - Return patient to hospital ward for ongoing care.                           - Advance diet as tolerated and per Speech                            Pathology recommendations.                           - Continue trending CBCs.                           - If acute rebleeding event, recommend CTA (if                            brisk bleed) or tagged RBC  scan.                           - Patient unlikely to tolerate bowel preparation.                            If rebleeding occurs with need for colonoscopy,                            will likely need NGT placement with bowel                            preparation slowly through NGT. Procedure Code(s):        --- Professional ---                           [phone], Sigmoidoscopy, flexible; diagnostic,                            including collection of specimen(s) by brushing or                            washing, when performed (separate procedure) Diagnosis Code(s):        --- Professional ---                           K62.5, Hemorrhage of anus and rectum                           K92.2, Gastrointestinal hemorrhage, unspecified                           K92.1, Melena (includes Hematochezia) CPT copyright 2019 American Medical Association. All rights reserved. The codes documented in this report are preliminary and upon coder review may  be revised to meet current compliance requirements. Doristine Locks, MD 10/18/2021 2:18:48 PM Number of Addenda: 0

## 2021-10-18 NOTE — Anesthesia Postprocedure Evaluation (Signed)
Anesthesia Post Note  Patient: Carlos [name]  Procedure(s) Performed: ESOPHAGOGASTRODUODENOSCOPY (EGD) WITH PROPOFOL FLEXIBLE SIGMOIDOSCOPY     Patient location during evaluation: PACU Anesthesia Type: MAC Post-procedure mental status: neuro at baseline. Pain management: pain level controlled Vital Signs Assessment: post-procedure vital signs reviewed and stable Respiratory status: spontaneous breathing, nonlabored ventilation, respiratory function stable and patient connected to nasal cannula oxygen Cardiovascular status: stable and blood pressure returned to baseline Postop Assessment: no apparent nausea or vomiting Anesthetic complications: no   No notable events documented.  Last Vitals:  Vitals:   10/18/21 1430 10/18/21 1435  BP: 106/60   Pulse: 90 81  Resp: 11 12  Temp:    SpO2: 96% 100%    Last Pain:  Vitals:   10/18/21 1430  TempSrc:   PainSc: 0-No pain                 Catalina Gravel

## 2021-10-18 NOTE — Op Note (Signed)
[facility] Patient Name: Carlos [name] Procedure Date : 10/18/2021 MRN: [id number] Attending MD: Doristine Locks , MD Date of Birth: [birth year] CSN: [id number] Age: 35 Admit Type: Inpatient Procedure:                Upper GI endoscopy Indications:              Acute post hemorrhagic anemia, Hematochezia Providers:                Doristine Locks, MD, Janae Sauce. Steele Berg, RN, Alyce Pagan Technician, Technician, Harrington Challenger, Pensions consultant Referring MD:              Medicines:                Monitored Anesthesia Care Complications:            No immediate complications. Estimated Blood Loss:     Estimated blood loss was minimal. Procedure:                Pre-Anesthesia Assessment:                           - Prior to the procedure, a History and Physical                            was performed, and patient medications and                            allergies were reviewed. The patient's tolerance of                            previous anesthesia was also reviewed. The risks                            and benefits of the procedure and the sedation                            options and risks were discussed with the patient.                            All questions were answered, and informed consent                            was obtained. Prior Anticoagulants: The patient has                            taken no previous anticoagulant or antiplatelet                            agents. ASA Grade Assessment: III - A patient with                            severe systemic disease. After reviewing the risks  and benefits, the patient was deemed in                            satisfactory condition to undergo the procedure.                           After obtaining informed consent, the endoscope was                            passed under direct vision. Throughout the                            procedure, the patient's blood  pressure, pulse, and                            oxygen saturations were monitored continuously. The                            [id number]-H190 ([id number] Olympus endoscope was introduced                            through the mouth, and advanced to the second part                            of duodenum. The upper GI endoscopy was                            accomplished without difficulty. The patient                            tolerated the procedure well. Scope In: Scope Out: Findings:      LA Grade A esophagitis with no bleeding was found in the lower third of       the esophagus. Biopsies were taken with a cold forceps for histology.       Estimated blood loss was minimal.      Diffuse moderate inflammation characterized by congestion (edema) and       erythema was found in the gastric fundus, in the gastric body and in the       gastric antrum. No gastric ulcers or stigmata of bleeding was noted.       Biopsies were taken with a cold forceps for histology. Estimated blood       loss was minimal.      The examined duodenum was normal. Impression:               - Esophagitis with no bleeding. Biopsied.                           - Moderately severe, non-ulcer gastritis. Biopsied.                           - Normal examined duodenum. Recommendation:           - Await pathology results.                           -  Use Protonix (pantoprazole) 40 mg PO BID for 6                            weeks.                           - Perform a flexible sigmoidoscopy today.                           - See flexible sigmoidoscopy notes for additional                            recommendations for ongoing inpatient managment. Procedure Code(s):        --- Professional ---                           [phone], Esophagogastroduodenoscopy, flexible,                            transoral; with biopsy, single or multiple Diagnosis Code(s):        --- Professional ---                           K20.90, Esophagitis,  unspecified without bleeding                           K29.70, Gastritis, unspecified, without bleeding                           D62, Acute posthemorrhagic anemia                           K92.1, Melena (includes Hematochezia) CPT copyright 2019 American Medical Association. All rights reserved. The codes documented in this report are preliminary and upon coder review may  be revised to meet current compliance requirements. Doristine Locks, MD 10/18/2021 2:27:38 PM Number of Addenda: 0

## 2021-10-18 NOTE — Anesthesia Preprocedure Evaluation (Signed)
Anesthesia Evaluation  Patient identified by MRN, date of birth, ID band Patient unresponsive    Reviewed: Allergy & Precautions, NPO status , Patient's Chart, lab work & pertinent test results  Airway Mallampati: II  TM Distance: >3 FB Neck ROM: Full    Dental  (+) Dental Advisory Given, Poor Dentition   Pulmonary neg pulmonary ROS,    Pulmonary exam normal breath sounds clear to auscultation       Cardiovascular negative cardio ROS Normal cardiovascular exam Rhythm:Regular Rate:Normal     Neuro/Psych Seizures -,  CP    GI/Hepatic Neg liver ROS, rectal bleeding, abnormal CT   Endo/Other  negative endocrine ROS  Renal/GU Renal disease     Musculoskeletal negative musculoskeletal ROS (+)   Abdominal   Peds  Hematology  (+) Blood dyscrasia (Thrombocytopenia), anemia ,   Anesthesia Other Findings Day of surgery medications reviewed with the patient.  Reproductive/Obstetrics                             Anesthesia Physical Anesthesia Plan  ASA: 3  Anesthesia Plan: MAC   Post-op Pain Management:    Induction: Intravenous  PONV Risk Score and Plan: 1 and Propofol infusion and Treatment may vary due to age or medical condition  Airway Management Planned: Nasal Cannula and Natural Airway  Additional Equipment:   Intra-op Plan:   Post-operative Plan:   Informed Consent: I have reviewed the patients History and Physical, chart, labs and discussed the procedure including the risks, benefits and alternatives for the proposed anesthesia with the patient or authorized representative who has indicated his/her understanding and acceptance.     Dental advisory given and Consent reviewed with POA  Plan Discussed with: CRNA and Anesthesiologist  Anesthesia Plan Comments: (Phone consent with patient's father.)        Anesthesia Quick Evaluation

## 2021-10-18 NOTE — H&P (View-Only) (Signed)
Attending physician's note   I have taken an interval history, reviewed the chart and examined the patient. I agree with the Advanced Practitioner's note, impression, and recommendations as outlined.   35 year old male with medical history as outlined below to include history of cerebral palsy, quadriplegia, nonverbal, seizure disorder, admitted with AMS and multiple seizures.  Patient unable to provide any history and unfortunately mother also admitted to the ICU currently.  I tried calling his father, but no answer.  I left a voicemail with a callback number.  GI service was contacted for large volume bloody stools earlier today.  Constipation noted on admission CT and was treated with enema yesterday with good results, but then bloody stool today prior to repeat enemas.  Exam today with small hemorrhoids but no obvious anal fissure.  Small amount of blood in diaper; patient was just cleaned up prior to exam.  H/H continues to slightly downtrend from 14.6 --> 11.6 --> 10.4 earlier today--> 9.6 after bloody stool.  BUN: Creatinine ratio elevated at 30: 0.83, but also in the setting of resolving AKI (was 112/2.7 on 11/30).  Admission CT also with distal esophageal wall thickening and moderately distended stomach as well as large amount of stool throughout the colon.  1) Acute blood loss anemia 2) Hematochezia 3) Esophageal thickening on CT 4) Stool retention/constipation on CT - Plan for expedited EGD and flexible sigmoidoscopy today for diagnostic and therapeutic intent - Continue H/H trend with blood products as needed per protocol - Started on PPI - Have attempted to call patient's father to obtain consent.  No answer, voicemail left.  Will try again.  His mother is currently admitted to the ICU.    5) Pancreatitis on CT - Admission CT with questionable mild peripancreatic inflammation.  Lipase slightly elevated 186, but downtrending yesterday 117.  No abdominal tenderness on exam and  patient otherwise nonverbal - He is n.p.o. today for procedures  6) Cerebral Palsy 7) Quadriplegia 8) Nonverbal 9) AMS 10) Seizures - Seizure management per Neurology service - After procedures, plan for resuming p.o. intake per consulting Speech Pathology  EDIT: I was able to speak with the patient's father, Carlos [name], on the phone today.  Discussed Carlos [name]'s current clinical situation.  We discussed the pros/cons of endoscopic evaluation to include the risks, benefits, alternatives of EGD and flexible sigmoidoscopy.  His father provides verbal consent over the phone to proceed with EGD and flexible sigmoidoscopy today for diagnostic and potentially therapeutic intent.  Of note, Carlos [name] relates that does have a history of constipation, but otherwise had actually been doing quite well with this at home with daily MiraLAX.  He will sometimes have to use a suppository, but that is only every 3-4 months.  Otherwise last time he has used an enema was more than a year ago.  [address], DO, FACG [phone] office         Consultation Referring MD; [facility] MD Primary Care Physician:  Pcp, No Primary Gastroenterologist: None   Reason for Consultation: Acute rectal bleeding  HPI: Carlos [name] is a 35 y.o. male with history of cerebral palsy, baseline quadriplegia, nonverbal, seizure disorder who was brought to the hospital 2 days ago with altered mental status and multiple seizures.  Frequent breakthrough seizures at home.  Patient's mother currently admitted to the hospital herself, and father had been out of town.  Patient had been apparently missing his home medications.  We are asked to evaluate today acute onset earlier  this morning with a large volume of grossly bloody stool per rectum.  Patient has been constipated and CT imaging on admission showed a large volume of stool in the colon.  He did receive an enema yesterday and had good results.  Bowel movement this morning  was before enemas were given.  Nurse reports grossly bloody and dark blood, no melena 1 episode thus far By review of the chart, patient has not had any prior GI issues, no previous procedures.  He did have CT of the abdomen pelvis done on admission which shows probable mild peripancreatic inflammation, distal esophageal wall thickening concerning for esophagitis, some distention of the stomach with air-fluid levels and large stool burden.  Labs today WBC 7.6/hemoglobin 10.4/hematocrit 34.5/platelets 95-hemoglobin down from 13.5 on admission, 11.6 yesterday Potassium 3.4/BUN 30/creatinine 0.8.  Patient dehydrated on admission.  Hyponatremia, hemoconcentration and AKI are resolving. Patient is not on any blood thinners, no NSAIDs at home.  Unable to offer any history, and family unavailable presently.  As above his mother is currently in the intensive care unit   Past Medical History:  Diagnosis Date  . 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 Cerebral palsy ([place])   . Folliculitis barbae 1/60/7371  . Groin abscess 03/02/2008  . Left hip pain 02/05/2016  . Pneumonia   . Seizures ([place])   . Status epilepticus ([place])   . Swelling of joint of pelvic region or thigh 02/05/2016  . 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 STRABISMUS SURGERY     in [place]    Prior to Admission medications   Medication Sig Start Date End Date Taking? Authorizing Provider  cetirizine HCl (ZYRTEC) 5 MG/5ML SOLN Take 10 mLs (10 mg total) by mouth daily as needed for itching. 08/18/17  Yes Nicolette Bang, MD  Lacosamide 100 MG TABS Take 100-200 mg by mouth See admin instructions. 100 mg in the morning 200 mg at bedtime   Yes [provider]  lamoTRIgine (LAMICTAL) 200 MG tablet Take 200 mg by mouth 2 (two) times daily.   Yes [provider]  lamoTRIgine (LAMICTAL) 25 MG tablet Take 25 mg by mouth 2 (two) times daily.   Yes  [provider]  levETIRAcetam (KEPPRA) 750 MG tablet Take 1,500 mg by mouth 2 (two) times daily.   Yes [provider]  polyethylene glycol (MIRALAX / GLYCOLAX) 17 g packet Take 17 g by mouth daily as needed for mild constipation.   Yes [provider]  sodium phosphate Pediatric (FLEET) 3.5-9.5 GM/59ML enema Place 1 enema rectally every three (3) days as needed for constipation.   Yes [provider]  triamcinolone cream (KENALOG) 0.1 % Apply small amount to rash twice per day Patient taking differently: Apply 1 application topically 2 (two) times daily. 06/30/18  Yes Goodpasture, Otila Kluver, NP  KEPPRA 750 MG tablet Take 2 tablets (1,500 mg total) by mouth 2 (two) times daily. Patient not taking: Reported on 10/16/2021 09/16/21   Rockwell Germany, NP  lactulose [facility]) 10 GM/15ML solution Give 73m by mouth twice per day Patient not taking: Reported on 10/16/2021 07/12/19   GRockwell Germany NP  LAMICTAL 200 MG tablet TAKE 1 TABLET BY MOUTH TWICE A DAY Patient not taking: Reported on 10/16/2021 09/16/21   GRockwell Germany NP  LAMICTAL 25 MG tablet TAKE 1 TABLET BY MOUTH TWICE A DAY Patient not taking: Reported on 10/16/2021 09/16/21   GRockwell Germany NP  VIMPAT 100 MG TABS TAKE 1 TABLET EVERY DAY IN THE MORNING TAKE 2 TABLETS IN THE EVENING Patient not taking: Reported on 10/16/2021 09/16/21   Rockwell Germany, NP    Current Facility-Administered Medications  Medication Dose Route Frequency Provider Last Rate Last Admin  . 0.45 % sodium chloride infusion   Intravenous Continuous Lovey Newcomer T, NP 100 mL/hr at 10/17/21 2243 New Bag at 10/17/21 2243  . chlorhexidine gluconate (MEDLINE KIT) (PERIDEX) 0.12 % solution 15 mL  15 mL Mouth Rinse BID Wynetta Fines T, MD   15 mL at 10/18/21 7494  . Chlorhexidine Gluconate Cloth 2 % PADS 6 each  6 each Topical Daily Lequita Halt, MD   6 each at 10/18/21 1012  . insulin aspart (novoLOG) injection 0-9 Units  0-9  Units Subcutaneous TID WC Wynetta Fines T, MD      . lacosamide (VIMPAT) 200 mg in sodium chloride 0.9 % 25 mL IVPB  200 mg Intravenous Q12H Amie Portland, MD 90 mL/hr at 10/17/21 2244 200 mg at 10/17/21 2244  . lactulose (CHRONULAC) enema 200 gm  300 mL Rectal Daily Wynetta Fines T, MD   300 mL at 10/17/21 1238  . levETIRAcetam (KEPPRA) 750 mg in sodium chloride 0.9 % 100 mL IVPB  750 mg Intravenous Q12H Amie Portland, MD 430 mL/hr at 10/18/21 0756 750 mg at 10/18/21 0756  . LORazepam (ATIVAN) injection 4 mg  4 mg Intravenous Q5 Min x 2 PRN Wynetta Fines T, MD      . MEDLINE mouth rinse  15 mL Mouth Rinse 10 times per day Wynetta Fines T, MD   15 mL at 10/18/21 0629  . ondansetron (ZOFRAN) tablet 4 mg  4 mg Oral Q6H PRN Wynetta Fines T, MD       Or  . ondansetron [facility]) injection 4 mg  4 mg Intravenous Q6H PRN Wynetta Fines T, MD      . pantoprazole (PROTONIX) injection 40 mg  40 mg Intravenous Q24H Wynetta Fines T, MD   40 mg at 10/17/21 1803    Allergies as of 10/16/2021  . (No Known Allergies)    Family History  Problem Relation Age of Onset  . Cancer Paternal Grandfather        Died at 16    Social History   Socioeconomic History  . Marital status: Single    Spouse name: Not on file  . Number of children: Not on file  . Years of education: Not on file  . Highest education level: Not on file  Occupational History  . Not on file  Tobacco Use  . Smoking status: Passive Smoke Exposure - Never Smoker  . Smokeless tobacco: Never  Substance and Sexual Activity  . Alcohol use: No    Alcohol/week: 0.0 standard drinks  . Drug use: No  . Sexual activity: Never  Other Topics Concern  . Not on file  Social History Narrative   [place] attends [employer] day program.    He enjoys playing with toys and watching television.   Lives with his parents. He does not have any siblings.   Social Determinants of Health   Financial Resource Strain: Not on file  Food Insecurity: Not on file   Transportation Needs: Not on file  Physical Activity: Not on file  Stress: Not on file  Social Connections: Not on file  Intimate Partner Violence: Not on file    Review of Systems: Pertinent positive and negative review of systems were noted  in the above HPI section.  All other review of systems was otherwise negative.   Physical Exam: Vital signs in last 24 hours: Temp:  [96.8 F (36 C)-99.1 F (37.3 C)] 97.6 F (36.4 C) (12/02 0921) Pulse Rate:  [70-107] 103 (12/02 0921) Resp:  [10-19] 17 (12/02 0921) BP: (91-117)/(60-86) 100/77 (12/02 0921) SpO2:  [97 %-100 %] 100 % (12/02 0921)   General:   Alert, chronically ill-very thin appearing small African-American male, opens eyes but nonverbal, mouth breathing Head:  Normocephalic and atraumatic. Eyes:  Sclera clear, no icterus.   Conjunctiva pink. Ears:  Normal auditory acuity. Nose:  No deformity, discharge,  or lesions. Mouth:  No deformity or lesions.   Neck:  Supple; no masses or thyromegaly. Lungs:  Clear throughout to auscultation.   No wheezes, crackles, or rhonchi.  Heart: tachy  Regular rate and rhythm; no murmurs, clicks, rubs,  or gallops. Abdomen:  Soft, no appreciable tenderness BS active,nonpalp mass or hsm.   Rectal: No external lesions noted, small amount of dark red blood  the perineum Msk: Lower extremities contractures.  Quadriplegia Pulses:  Normal pulses noted. Extremities:  Without clubbing or edema. Neurologic: Minimally responsive Skin: Bullous appearing lesions on both knees and lower extremities Psych: Nonverbal  Intake/Output from previous day: 12/01 0701 - 12/02 0700 In: 4056.1 [I.V.:3624.6; IV Piggyback:431.4] Out: 2300 [Urine:2300] Intake/Output this shift: No intake/output data recorded.  Lab Results: Recent Labs    10/16/21 1456 10/16/21 1504 10/17/21 0548 10/18/21 0414 10/18/21 1025  WBC 12.8*  --  11.2* 7.6  --   HGB 13.5   < > 11.6* 10.4* 9.6*  HCT 48.1   < > 39.7 34.5* 30.8*   PLT 100*  --  61* 95*  --    < > = values in this interval not displayed.   BMET Recent Labs    10/17/21 1521 10/17/21 1825 10/18/21 0414  NA 152* 152* 152*  K 4.7 4.4 3.4*  CL 121* 122* 124*  CO2 23 22 23   GLUCOSE 89 88 85  BUN 46* 40* 30*  CREATININE 0.75 0.72 0.83  CALCIUM 7.5* 7.4* 7.7*   LFT Recent Labs    10/17/21 0548  PROT 5.5*  ALBUMIN 2.2*  AST 29  ALT 10  ALKPHOS 40  BILITOT 0.5   PT/INR Recent Labs    10/16/21 1611  LABPROT 16.2*  INR 1.3*      IMPRESSION:  #64 35 year old African-American male with cerebral palsy, quadriplegia, nonverbal at baseline admitted with altered mental status and multiple seizures  #2 acute onset today of grossly bloody stool x1 .  Patient has been constipated, received enema yesterday. Hemoglobin down to 0.8 g since yesterday  Suspect anorectal source of bleeding, possible stercoral ulceration, proctitis, cannot rule out diverticular or other occult lesion.  Doubt upper GI source  #3 abnormal esophagus on CT with thickening in the distal esophagus #4 mild peripancreatic inflammation on imaging on admission, no appreciable abdominal tenderness, he did have elevated lipase, probably has had some mild pancreatitis  #5 malnutrition    PLAN: Keep n.p.o. Serial hemoglobins and transfuse for hemoglobin less than 8 Continue IV PPI, currently on once daily Discontinue enemas for now Patient will be scheduled for flexible sigmoidoscopy and upper endoscopy with Dr. Bryan Lemma later today.  Will need to discuss with patient's father, and have him sign permit. Further recommendations pending results of above.    Amy Esterwood PA-C 10/18/2021, 10:52 AM

## 2021-10-18 NOTE — Interval H&P Note (Signed)
History and Physical Interval Note:  10/18/2021 1:09 PM  ELKIN BELFIELD  has presented today for surgery, with the diagnosis of rectal bleeding, abnormal CT.  The various methods of treatment have been discussed with the patient's father by phone. After consideration of risks, benefits and other options for treatment, the patient's father has verbally consented to  Procedure(s): ESOPHAGOGASTRODUODENOSCOPY (EGD) WITH PROPOFOL (N/A) FLEXIBLE SIGMOIDOSCOPY (N/A) as a surgical intervention.  The patient's history has been reviewed, patient examined, no change in status, stable for surgery.  I have reviewed the patient's chart and labs.  Questions were answered to the family members satisfaction.     Verlin Dike Barbaraann Avans

## 2021-10-18 NOTE — Progress Notes (Signed)
PROGRESS NOTE    Carlos [name]  WCH:[id number] DOB: Aug 21, 1986 DOA: 10/16/2021 PCP: Pcp, No   Brief Narrative:  Carlos [name] is a 35 y.o. 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  Patient apparently has been having breakthrough seizures per discussion at admission per mother who is now been admitted to the hospital herself, concern the patient had been missing home medications due to her illness at home while patient's father has been out of town.   Assessment & Plan:   Acute anemia likely secondary to blood loss, rule out GI bleed -Patient unfortunately noted to have very large bright red bloody bowel movement this morning, GI consulted, emergent upper endoscopy planned later today -Discontinue DVT prophylaxis with heparin but otherwise no clear indications or etiology for acute bleed -Repeat H&H downtrending (9 from baseline of 14) but still within normal limits, consider transfusion should repeat H&H this afternoon continue to downtrend  Acute metabolic encephalopathy on known baseline cerebral palsy, POA -Likely in the setting of acute breakthrough seizure as below -Rule out polypharmacy -Unlikely infectious process, discontinue antibiotics given patient remains without fever; leukocytosis minimal likely in the setting of hemoconcentration given below -Hyperammonemia, mild, continue lactulose rectally if unable to take p.o. appropriately or safely (enema currently on hold due to GI bleed).   Acute breakthrough seizure, concern for medication noncompliance/mis-administration -IV Keppra and IV Vimpat for now -Neurology following, appreciate insight and recommendations -Keppra level minimally elevated, Lamictal level within normal limits -As needed lorazepam   Hypovolemic hypernatremia, severe Hemoconcentration in the setting of profound dehydration and poor p.o.  intake Concurrent lactic acidosis -Hyponatremia, lactic acidosis downtrending with IV fluids  -Continue half-normal saline, follow sodium -currently downtrending appropriately    AKI, Resolved -Continue aggressive IV fluids as above   Questionable acute pancreatitis -Based on imaging, physical exam somewhat limited given patient's baseline mental status with acute metabolic encephalopathy overlying  -Imaging unremarkable for acute findings to explain pancreatitis with normal-appearing gallbladder and common bile duct within normal limits  -NPO, IV fluid ongoing -Lipase downtrending appropriately   Reported history of diabetes, well controlled A1c 6.1 Continue sliding scale close monitoring while n.p.o., hypoglycemic protocol ongoing   Chronic severe protein calorie malnutrition -NPO until able to take p.o. safely, speech to follow -Feeding tube discussed at intake   DVT prophylaxis: Lovenox Code Status: Full code Family Communication: None present, mother has been admitted to the hospital in the interim; unable to reach father at this time  Status is: Inpatient  Dispo: The patient is from: Home              Anticipated d/c is to: To be determined              Anticipated d/c date is: 48 to 72 hours              Patient currently not medically stable for discharge  Consultants:  Neurology, GI  Procedures:  None  Antimicrobials:  Discontinued 10/17/2021  Subjective: Large bright red bloody bowel movement this morning otherwise no acute issues or events overnight, review of systems limited given patient's mental status  Objective: Vitals:   10/18/21 0718 10/18/21 0921 10/18/21 1132 10/18/21 1253  BP: 96/77 100/77 104/66 98/64  Pulse: (!) 102 (!) 103 91 (!) 109  Resp: _0 Temp: 98 F (36.7 C) 97.6 F (36.4 C) 97.6 F (36.4 C) 97.7 F (  36.5 C)  TempSrc: Oral Axillary Axillary Other (Comment)  SpO2: 97% 100% 100% 100%    Intake/Output Summary (Last 24  hours) at 10/18/2021 1412 Last data filed at 10/18/2021 1404 Gross per 24 hour  Intake 4356.07 ml  Output 1600 ml  Net 2756.07 ml   There were no vitals filed for this visit.  Examination:  General: Cachectic appearing gentleman, pleasantly resting in bed, No acute distress.  Minimally responsive to tactile stimuli but protecting airway HEENT:  Normocephalic atraumatic.  Sclerae nonicteric, noninjected.  Extraocular movements intact bilaterally. Neck:  Without mass or deformity.  Trachea is midline. Lungs:  Clear to auscultate bilaterally without rhonchi, wheeze, or rales. Heart: Tachycardic, regular rhythm without murmurs rubs or gallops Abdomen:  Soft, nontender, nondistended.  Without guarding or rebound. Extremities: Without overt edema, cyanosis or clubbing. Vascular:  Dorsalis pedis and posterior tibial pulses palpable bilaterally. Skin:  Warm and dry, no erythema, multiple fluid-filled large bullae over patient's wrist knees ankles, see nursing documentation   Data Reviewed: I have personally reviewed following labs and imaging studies  CBC: Recent Labs  Lab 10/16/21 1456 10/16/21 1504 10/17/21 0548 10/18/21 0414 10/18/21 1025  WBC 12.8*  --  11.2* 7.6  --   NEUTROABS 10.3*  --   --   --   --   HGB 13.5 14.6  14.6 11.6* 10.4* 9.6*  HCT 48.1 43.0  43.0 39.7 34.5* 30.8*  MCV 87.8  --  85.7 81.9  --   PLT 100*  --  61* 95*  --    Basic Metabolic Panel: Recent Labs  Lab 10/17/21 0548 10/17/21 1112 10/17/21 1521 10/17/21 1825 10/18/21 0414  NA 152* 155* 152* 152* 152*  K 4.0 3.4* 4.7 4.4 3.4*  CL 122* 122* 121* 122* 124*  CO2 _0 GLUCOSE 108* 104* 89 88 85  BUN 68* 53* 46* 40* 30*  CREATININE 1.14 0.85 0.75 0.72 0.83  CALCIUM 7.4* 7.3* 7.5* 7.4* 7.7*   GFR: CrCl cannot be calculated (Unknown ideal weight.). Liver Function Tests: Recent Labs  Lab 10/16/21 1456 10/17/21 0548  AST 36 29  ALT 13 10  ALKPHOS 44 40  BILITOT 0.6 0.5  PROT 7.3  5.5*  ALBUMIN 3.0* 2.2*   Recent Labs  Lab 10/16/21 1611 10/17/21 0548  LIPASE 186* 117*   Recent Labs  Lab 10/16/21 2046  AMMONIA 46*   Coagulation Profile: Recent Labs  Lab 10/16/21 1611  INR 1.3*   Cardiac Enzymes: No results for input(s): CKTOTAL, CKMB, CKMBINDEX, TROPONINI in the last 168 hours. BNP (last 3 results) No results for input(s): PROBNP in the last 8760 hours. HbA1C: Recent Labs    10/17/21 0548  HGBA1C 6.1*   CBG: Recent Labs  Lab 10/17/21 1124 10/17/21 1603 10/17/21 2145 10/18/21 0759 10/18/21 1131  GLUCAP 72 87 70 102* 88   Lipid Profile: Recent Labs    10/17/21 0548  CHOL 141  HDL 14*  LDLCALC 87  TRIG 201*  CHOLHDL 10.1   Thyroid Function Tests: No results for input(s): TSH, T4TOTAL, FREET4, T3FREE, THYROIDAB in the last 72 hours. Anemia Panel: No results for input(s): VITAMINB12, FOLATE, FERRITIN, TIBC, IRON, RETICCTPCT in the last 72 hours. Sepsis Labs: Recent Labs  Lab 10/16/21 1611 10/16/21 2046  LATICACIDVEN 5.1* 4.1*    Recent Results (from the past 240 hour(s))  Resp Panel by RT-PCR (Flu A&B, Covid) Nasopharyngeal Swab     Status: None   Collection Time: 10/16/21  2:54  PM   Specimen: Nasopharyngeal Swab; Nasopharyngeal(NP) swabs in vial transport medium  Result Value Ref Range Status   SARS Coronavirus 2 by RT PCR NEGATIVE NEGATIVE Final    Comment: (NOTE) SARS-CoV-2 target nucleic acids are NOT DETECTED.  The SARS-CoV-2 RNA is generally detectable in upper respiratory specimens during the acute phase of infection. The lowest concentration of SARS-CoV-2 viral copies this assay can detect is 138 copies/mL. A negative result does not preclude SARS-Cov-2 infection and should not be used as the sole basis for treatment or other patient management decisions. A negative result may occur with  improper specimen collection/handling, submission of specimen other than nasopharyngeal swab, presence of viral mutation(s)  within the areas targeted by this assay, and inadequate number of viral copies(<138 copies/mL). A negative result must be combined with clinical observations, patient history, and epidemiological information. The expected result is Negative.  Fact Sheet for Patients:  EntrepreneurPulse.com.au  Fact Sheet for Healthcare Providers:  IncredibleEmployment.be  This test is no t yet approved or cleared by the [place] FDA and  has been authorized for detection and/or diagnosis of SARS-CoV-2 by FDA under an Emergency Use Authorization (EUA). This EUA will remain  in effect (meaning this test can be used) for the duration of the COVID-19 declaration under Section 564(b)(1) of the Act, 21 U.S.C.section 360bbb-3(b)(1), unless the authorization is terminated  or revoked sooner.       Influenza A by PCR NEGATIVE NEGATIVE Final   Influenza B by PCR NEGATIVE NEGATIVE Final    Comment: (NOTE) The Xpert Xpress SARS-CoV-2/FLU/RSV plus assay is intended as an aid in the diagnosis of influenza from Nasopharyngeal swab specimens and should not be used as a sole basis for treatment. Nasal washings and aspirates are unacceptable for Xpert Xpress SARS-CoV-2/FLU/RSV testing.  Fact Sheet for Patients: EntrepreneurPulse.com.au  Fact Sheet for Healthcare Providers: IncredibleEmployment.be  This test is not yet approved or cleared by the [place] FDA and has been authorized for detection and/or diagnosis of SARS-CoV-2 by FDA under an Emergency Use Authorization (EUA). This EUA will remain in effect (meaning this test can be used) for the duration of the COVID-19 declaration under Section 564(b)(1) of the Act, 21 U.S.C. section 360bbb-3(b)(1), unless the authorization is terminated or revoked.  Performed at [facility], [place] [address], [place], [place] [address]   Blood Culture (routine x 2)     Status: None  (Preliminary result)   Collection Time: 10/16/21  4:14 PM   Specimen: BLOOD  Result Value Ref Range Status   Specimen Description BLOOD RIGHT ANTECUBITAL  Final   Special Requests   Final    BOTTLES DRAWN AEROBIC ONLY Blood Culture adequate volume   Culture   Final    NO GROWTH 2 DAYS Performed at [facility], [place] [address] [place], [place] [address]    Report Status PENDING  Incomplete  Urine Culture     Status: None   Collection Time: 10/17/21  3:20 AM   Specimen: In/Out Cath Urine  Result Value Ref Range Status   Specimen Description IN/OUT CATH URINE  Final   Special Requests NONE  Final   Culture   Final    NO GROWTH Performed at [facility], [place] [address], [place], [place] [address]    Report Status 10/18/2021 FINAL  Final  MRSA Next Gen by PCR, Nasal     Status: None   Collection Time: 10/17/21  9:01 AM   Specimen: Nasal  Mucosa; Nasal Swab  Result Value Ref Range Status   MRSA by PCR Next Gen NOT DETECTED NOT DETECTED Final    Comment: (NOTE) The GeneXpert MRSA Assay (FDA approved for NASAL specimens only), is one component of a comprehensive MRSA colonization surveillance program. It is not intended to diagnose MRSA infection nor to guide or monitor treatment for MRSA infections. Test performance is not FDA approved in patients less than 10 years old. Performed at [facility], [place] [address] [place], [place] [address]          Radiology Studies: CT ABDOMEN PELVIS WO CONTRAST  Result Date: 10/16/2021 CLINICAL DATA:  Acute pancreatitis.  Mental status change. EXAM: CT ABDOMEN AND PELVIS WITHOUT CONTRAST TECHNIQUE: Multidetector CT imaging of the abdomen and pelvis was performed following the standard protocol without IV contrast. COMPARISON:  CT abdomen and pelvis 07/07/2020. FINDINGS: Lower chest: No acute abnormality. 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 The largest is in the left  lobe measuring 3.3 cm. These appear unchanged. No definite new liver lesions are identified. Gallbladder and bile ducts are within normal limits. Pancreas: There is questionable mild peripancreatic inflammation. Pancreas is otherwise grossly within normal limits, but not well delineated on this noncontrast study. Spleen: Normal in size without focal abnormality. Adrenals/Urinary Tract: Adrenal glands are unremarkable. Kidneys are normal, without renal calculi, focal lesion, or hydronephrosis. Bladder is unremarkable. Stomach/Bowel: There is distal esophageal wall thickening. Stomach moderately distended with air-fluid level. Limits. Appendix is not visualized. No evidence of bowel wall thickening, distention, or inflammatory changes. There is a large amount of stool throughout the colon. Vascular/Lymphatic: No significant vascular findings are present. No enlarged abdominal or pelvic lymph nodes. Reproductive: Prostate is unremarkable. Other: No abdominal wall hernia or abnormality. No focal fluid collections. No abdominopelvic ascites. Musculoskeletal: There are chronic changes of the left hip similar to the prior study. No acute fracture. IMPRESSION: 1. Can not exclude mild diffuse peripancreatic inflammation. No obvious fluid collections. 2. Distal esophageal wall thickening concerning for esophagitis. 3. Stomach is distended with air-fluid level. 4. Large stool burden. Electronically Signed   By: Ronney Asters M.D.   On: 10/16/2021 18:17   CT Head Wo Contrast  Result Date: 10/16/2021 CLINICAL DATA:  Altered mental status EXAM: CT HEAD WITHOUT CONTRAST TECHNIQUE: Contiguous axial images were obtained from the base of the skull through the vertex without intravenous contrast. COMPARISON:  Head CT dated 04/24/2016. 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 Similar appearance of the fourth ventricular size. No  acute intracranial hemorrhage. No midline shift. Vascular: No hyperdense vessel or unexpected calcification. Skull: Normal. Negative for fracture or focal lesion. Sinuses/Orbits: No acute finding. Other: None IMPRESSION: 1. No acute intracranial pathology. 2. 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 Electronically Signed   By: Anner Crete M.D.   On: 10/16/2021 18:15   DG Chest Portable 1 View  Result Date: 10/16/2021 CLINICAL DATA:  Altered mental status. EXAM: PORTABLE CHEST 1 VIEW COMPARISON:  04/24/2016. FINDINGS: Trachea is midline. Heart size normal. Lungs are clear. No pleural fluid. Nipple shadow projects over the lower left hemithorax. Stool is seen in the distal ascending, transverse and proximal descending colon. Gaseous distension of the stomach. Dextroconvex scoliosis. IMPRESSION: 1. No acute findings in the chest. 2. Stool in the visualized portions of the colon is indicative of constipation. Electronically Signed   By: Rip Harbour  Blietz M.D.   On: 10/16/2021 15:22   EEG adult  Result Date: 10/16/2021 Lora Havens, MD     10/17/2021  8:25 AM Patient Name: Carlos [name] MRN: [id number] Epilepsy Attending: Lora Havens Referring Physician/Provider: Dr Amie [place] Date: 10/16/2021 Duration: 31.01 mins Patient history:  35 y.o. 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 EEG to evaluate for seizure. Level of alertness:  lethargic AEDs during EEG study: LEV, LCM Technical aspects: This EEG study was done with scalp electrodes positioned according to the 10-20 International system of electrode placement. Electrical activity was acquired at a sampling rate of _0  and reviewed with a high frequency filter of _1  and a low frequency filter of _2 . EEG data were recorded continuously and digitally stored. Description: EEG showed continuous  generalized low amplitude , at times sharply contoured, 3 to 6 Hz theta-delta slowing. Hyperventilation and photic stimulation were not performed.   ABNORMALITY - Continuous slow, generalized IMPRESSION: This study is suggestive of severe diffuse encephalopathy, nonspecific etiology. No seizures or definite epileptiform discharges were seen throughout the recording. Dr Rory Percy was notified. Priyanka Barbra Sarks     Scheduled Meds:  [MAR Hold] chlorhexidine gluconate (MEDLINE KIT)  15 mL Mouth Rinse BID   [MAR Hold] Chlorhexidine Gluconate Cloth  6 each Topical Daily   [MAR Hold] insulin aspart  0-9 Units Subcutaneous TID WC   [MAR Hold] lacosamide  200 mg Oral BID   lactulose  300 mL Rectal Daily   [MAR Hold] lamoTRIgine  225 mg Oral BID   [MAR Hold] levETIRAcetam  750 mg Oral BID   [MAR Hold] mouth rinse  15 mL Mouth Rinse 10 times per day   [MAR Hold] pantoprazole (PROTONIX) IV  40 mg Intravenous Q24H   Continuous Infusions:  sodium chloride 100 mL/hr at 10/17/21 2243   [MAR Hold] levETIRAcetam 750 mg (10/18/21 0756)     LOS: 2 days   Little Ishikawa, DO [facility] Pager: Secure chat  If 7PM-7AM, please contact night-coverage [email or web address]  10/18/2021, 2:12 PM

## 2021-10-19 DIAGNOSIS — E860 Dehydration: Secondary | ICD-10-CM

## 2021-10-19 LAB — GLUCOSE, CAPILLARY
Glucose-Capillary: 75 mg/dL (ref 70–99)
Glucose-Capillary: 79 mg/dL (ref 70–99)
Glucose-Capillary: 82 mg/dL (ref 70–99)
Glucose-Capillary: 84 mg/dL (ref 70–99)
Glucose-Capillary: 84 mg/dL (ref 70–99)
Glucose-Capillary: 85 mg/dL (ref 70–99)
Glucose-Capillary: 93 mg/dL (ref 70–99)

## 2021-10-19 LAB — BASIC METABOLIC PANEL
Anion gap: 8 (ref 5–15)
BUN: 14 mg/dL (ref 6–20)
CO2: 23 mmol/L (ref 22–32)
Calcium: 7.9 mg/dL — ABNORMAL LOW (ref 8.9–10.3)
Chloride: 114 mmol/L — ABNORMAL HIGH (ref 98–111)
Creatinine, Ser: 0.62 mg/dL (ref 0.61–1.24)
GFR, Estimated: >60 mL/min (ref >60)
Glucose, Bld: 90 mg/dL (ref 70–99)
Potassium: 3.2 mmol/L — ABNORMAL LOW (ref 3.5–5.1)
Sodium: 145 mmol/L (ref 135–145)

## 2021-10-19 LAB — CBC
HCT: 29.4 % — ABNORMAL LOW (ref 39.0–52.0)
Hemoglobin: 9 g/dL — ABNORMAL LOW (ref 13.0–17.0)
MCH: 24.3 pg — ABNORMAL LOW (ref 26.0–34.0)
MCHC: 30.6 g/dL (ref 30.0–36.0)
MCV: 79.2 fL — ABNORMAL LOW (ref 80.0–100.0)
Platelets: 104 10*3/uL — ABNORMAL LOW (ref 150–400)
RBC: 3.71 MIL/uL — ABNORMAL LOW (ref 4.22–5.81)
RDW: 13.1 % (ref 11.5–15.5)
WBC: 8.2 10*3/uL (ref 4.0–10.5)
nRBC: 0.2 % (ref 0.0–0.2)

## 2021-10-19 LAB — HEMOGLOBIN AND HEMATOCRIT, BLOOD
HCT: 26.3 % — ABNORMAL LOW (ref 39.0–52.0)
HCT: 27.7 % — ABNORMAL LOW (ref 39.0–52.0)
Hemoglobin: 8.6 g/dL — ABNORMAL LOW (ref 13.0–17.0)
Hemoglobin: 8.8 g/dL — ABNORMAL LOW (ref 13.0–17.0)

## 2021-10-19 MED ORDER — POTASSIUM CHLORIDE 10 MEQ/100ML IV SOLN
10.0000 meq | INTRAVENOUS | Status: AC
  Administered 2021-10-19 (×2): 10 meq via INTRAVENOUS
  Filled 2021-10-19 (×2): qty 100

## 2021-10-19 NOTE — Progress Notes (Signed)
Speech Language Pathology Treatment: Dysphagia  Patient Details Name: Carlos [name] MRN: [id number] DOB: 08-Jan-1986 Today's Date: 10/19/2021 Time: 1100-1115 SLP Time Calculation (min) (ACUTE ONLY): 15 min  Assessment / Plan / Recommendation Clinical Impression  ST follow up for skilled therapy to address swallowing goals.  Per RN patient has been taking in small amounts of thin liquids via spoon and pureed material.  Lungs per RN are clear.  Per RN diet to be initiated once family can provide information on best methods for feeding him.  Therapeutic diet tolerance was completed with assistance of RN using thin liquids via spoon and pureed material.  Patient with little to no lip closure during intake of material and often while orally manipulating material. Mild anterior loss noted.  Oral residue noted given pureed material requiring liquid wash to assist with clearance that was not always 100% successful.  Swallow trigger was appreciated to palpation with no overt s/s of aspiration seen.  Suggest he continue on a pureed diet with thin liquids.  ST will continue to follow.     HPI HPI: Carlos [name] is a 35 y.o. 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  Pt has had poor PO intake for at least 3 days PTA .  15 lb weight loss this year.  CXR and Head CT 11/30 with no acute findings.  Pt with concerns for GIB following bloody BM morning of 12/2.      SLP Plan         Recommendations for follow up therapy are one component of a multi-disciplinary discharge planning process, led by the attending physician.  Recommendations may be updated based on patient status, additional functional criteria and insurance authorization.    Recommendations  Diet recommendations: Thin liquid;Dysphagia 1 (puree) Liquids provided via: Teaspoon Medication Administration:  Crushed with puree Supervision: Trained caregiver to feed patient Compensations: Slow rate;Small sips/bites;Follow solids with liquid Postural Changes and/or Swallow Maneuvers: Seated upright 90 degrees;Upright 30-60 min after meal                Oral Care Recommendations: Oral care QID Follow Up Recommendations: Other (comment) (TBD) Assistance recommended at discharge: Frequent or constant Supervision/Assistance SLP Visit Diagnosis: Dysphagia, oropharyngeal phase (R13.12)       GO               Dimas Aguas, MA, CCC-SLP Acute Rehab SLP [phone]  [name]  10/19/2021, 11:34 AM

## 2021-10-19 NOTE — Progress Notes (Signed)
This RN witnessed patients father feed patient very successfully. Patients chin was held up with white towel/washcloth and given a cup of water to drink, patients father held cup against patients mouth just like any other person would drink their water without a spoon and the patient began to swallow his water effectively. Patient head has to upright, no tilt when drinking fluids and with meals the chin must be grasped as if you are mechanically making the patient chew which will spur the patients chew and swallow triggers. Father states patient does well with mashed potatoes and normal diet at home is chopped however as of right now he should be placed on a puree diet because his tongue/ throat is dry. This RN witnessed the father feed the patient apple sauce effectively this way. This RN WCTM.   Mikki Harbor, RN

## 2021-10-19 NOTE — Progress Notes (Signed)
PROGRESS NOTE    Carlos [name]  NAT:[id number] DOB: 31-May-1986 DOA: 10/16/2021 PCP: Pcp, No   Brief Narrative:  Carlos [name] is a 35 y.o. 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  Patient apparently has been having breakthrough seizures per discussion at admission per mother who is now been admitted to the hospital herself, concern the patient had been missing home medications due to her illness at home while patient's father has been out of town.   Assessment & Plan:   Acute anemia likely secondary to blood loss, rule out GI bleed -GI following - upper endoscopy remarkable for esophagitis, severe non-ulcerated gastritis without overt bleeding. Flex sig without overt findings. Continue PPI. -Discontinue DVT prophylaxis with heparin but otherwise no clear indications or etiology for acute bleed -H/H previously downtrending - now normalizing (Baseline around 14) - currently 9.  Acute metabolic encephalopathy on known baseline cerebral palsy, POA, resolving -Likely in the setting of acute breakthrough seizure as below -Rule out polypharmacy -Unlikely infectious process, discontinue antibiotics given patient remains without fever; leukocytosis minimal likely in the setting of hemoconcentration given below -Hyperammonemia, mild, continue lactulose rectally if unable to take p.o. appropriately or safely (enema currently on hold due to GI bleed).   Acute breakthrough seizure, concern for medication noncompliance/mis-administration -IV Keppra and IV Vimpat for now -Neurology following, appreciate insight and recommendations -Keppra level minimally elevated, Lamictal level within normal limits -As needed lorazepam   Hypovolemic hypernatremia, severe Hemoconcentration in the setting of profound dehydration and poor p.o. intake Concurrent lactic acidosis -Hyponatremia, lactic  acidosis downtrending with IV fluids  -Continue half-normal saline, follow sodium -currently downtrending appropriately    AKI, Resolved -Continue aggressive IV fluids as above   Questionable acute pancreatitis -Based on imaging, physical exam somewhat limited given patient's baseline mental status with acute metabolic encephalopathy overlying  -Imaging unremarkable for acute findings to explain pancreatitis with normal-appearing gallbladder and common bile duct within normal limits  -NPO, IV fluid ongoing -Lipase downtrending appropriately   Reported history of diabetes, well controlled A1c 6.1 Continue sliding scale close monitoring while n.p.o., hypoglycemic protocol ongoing   Chronic severe protein calorie malnutrition -NPO until able to take p.o. safely, speech to follow -Feeding tube discussed at intake   DVT prophylaxis: Lovenox Code Status: Full code Family Communication: None present, mother has been admitted to the hospital in the interim; unable to reach father at this time  Status is: Inpatient  Dispo: The patient is from: Home              Anticipated d/c is to: To be determined              Anticipated d/c date is: 48 to 72 hours              Patient currently not medically stable for discharge  Consultants:  Neurology, GI  Procedures:  None  Antimicrobials:  Discontinued 10/17/2021  Subjective: No acute issues or events overnight, patient much more awake and alert, appears to be approaching baseline no further episodes of GI bleed reported per staff.  Review of systems limited given patient's baseline mental status.  Objective: Vitals:   10/18/21 1435 10/18/21 2022 10/18/21 2311 10/19/21 0310  BP:  109/84 100/67 106/67  Pulse: 81 (!) 113 (!) 114 98  Resp: 12 17 20 13   Temp:  98.3 F (36.8 C) 98.8 F (37.1 C) 98.1 F (36.7 C)  TempSrc:    Oral  SpO2: 100% 97% 98% 97%    Intake/Output Summary (Last 24 hours) at 10/19/2021 0758 Last data filed at  10/18/2021 2300 Gross per 24 hour  Intake 902.49 ml  Output 850 ml  Net 52.49 ml    There were no vitals filed for this visit.  Examination:  General: Cachectic appearing gentleman, pleasantly resting in bed, No acute distress.  Awake alert, orientation unable to be obtained HEENT:  Normocephalic atraumatic.  Sclerae nonicteric, noninjected.  Extraocular movements intact bilaterally. Neck:  Without mass or deformity.  Trachea is midline. Lungs:  Clear to auscultate bilaterally without rhonchi, wheeze, or rales. Heart: Tachycardic, regular rhythm without murmurs rubs or gallops Abdomen:  Soft, nontender, nondistended.  Without guarding or rebound. Extremities: Without overt edema, cyanosis or clubbing. Vascular:  Dorsalis pedis and posterior tibial pulses palpable bilaterally. Skin:  Warm and dry, no erythema, multiple fluid-filled large bullae over patient's wrist knees ankles, see nursing documentation  Data Reviewed: I have personally reviewed following labs and imaging studies  CBC: Recent Labs  Lab 10/16/21 1456 10/16/21 1504 10/17/21 0548 10/18/21 0414 10/18/21 1025 10/18/21 1841 10/19/21 0511  WBC 12.8*  --  11.2* 7.6  --   --  8.2  NEUTROABS 10.3*  --   --   --   --   --   --   HGB 13.5   < > 11.6* 10.4* 9.6* 8.8* 9.0*  HCT 48.1   < > 39.7 34.5* 30.8* 29.0* 29.4*  MCV 87.8  --  85.7 81.9  --   --  79.2*  PLT 100*  --  61* 95*  --   --  104*   < > = values in this interval not displayed.    Basic Metabolic Panel: Recent Labs  Lab 10/17/21 1112 10/17/21 1521 10/17/21 1825 10/18/21 0414 10/19/21 0511  NA 155* 152* 152* 152* 145  K 3.4* 4.7 4.4 3.4* 3.2*  CL 122* 121* 122* 124* 114*  CO2 27 23 22 23 23   GLUCOSE 104* 89 88 85 90  BUN 53* 46* 40* 30* 14  CREATININE 0.85 0.75 0.72 0.83 0.62  CALCIUM 7.3* 7.5* 7.4* 7.7* 7.9*    GFR: CrCl cannot be calculated (Unknown ideal weight.). Liver Function Tests: Recent Labs  Lab 10/16/21 1456 10/17/21 0548   AST 36 29  ALT 13 10  ALKPHOS 44 40  BILITOT 0.6 0.5  PROT 7.3 5.5*  ALBUMIN 3.0* 2.2*    Recent Labs  Lab 10/16/21 1611 10/17/21 0548  LIPASE 186* 117*    Recent Labs  Lab 10/16/21 2046  AMMONIA 46*    Coagulation Profile: Recent Labs  Lab 10/16/21 1611  INR 1.3*    Cardiac Enzymes: No results for input(s): CKTOTAL, CKMB, CKMBINDEX, TROPONINI in the last 168 hours. BNP (last 3 results) No results for input(s): PROBNP in the last 8760 hours. HbA1C: Recent Labs    10/17/21 0548  HGBA1C 6.1*    CBG: Recent Labs  Lab 10/18/21 1131 10/18/21 1528 10/18/21 2020 10/19/21 0008 10/19/21 0316  GLUCAP 88 78 91 75 84    Lipid Profile: Recent Labs    10/17/21 0548  CHOL 141  HDL 14*  LDLCALC 87  TRIG 201*  CHOLHDL 10.1    Thyroid Function Tests: No results for input(s): TSH, T4TOTAL, FREET4, T3FREE, THYROIDAB in the last 72 hours. Anemia Panel: No results for input(s): VITAMINB12, FOLATE, FERRITIN, TIBC, IRON, RETICCTPCT in the last 72 hours. Sepsis Labs: Recent Labs  Lab 10/16/21 1611 10/16/21 2046  LATICACIDVEN 5.1* 4.1*     Recent Results (from the past 240 hour(s))  Resp Panel by RT-PCR (Flu A&B, Covid) Nasopharyngeal Swab     Status: None   Collection Time: 10/16/21  2:54 PM   Specimen: Nasopharyngeal Swab; Nasopharyngeal(NP) swabs in vial transport medium  Result Value Ref Range Status   SARS Coronavirus 2 by RT PCR NEGATIVE NEGATIVE Final    Comment: (NOTE) SARS-CoV-2 target nucleic acids are NOT DETECTED.  The SARS-CoV-2 RNA is generally detectable in upper respiratory specimens during the acute phase of infection. The lowest concentration of SARS-CoV-2 viral copies this assay can detect is 138 copies/mL. A negative result does not preclude SARS-Cov-2 infection and should not be used as the sole basis for treatment or other patient management decisions. A negative result may occur with  improper specimen collection/handling,  submission of specimen other than nasopharyngeal swab, presence of viral mutation(s) within the areas targeted by this assay, and inadequate number of viral copies(<138 copies/mL). A negative result must be combined with clinical observations, patient history, and epidemiological information. The expected result is Negative.  Fact Sheet for Patients:  EntrepreneurPulse.com.au  Fact Sheet for Healthcare Providers:  IncredibleEmployment.be  This test is no t yet approved or cleared by the [place] FDA and  has been authorized for detection and/or diagnosis of SARS-CoV-2 by FDA under an Emergency Use Authorization (EUA). This EUA will remain  in effect (meaning this test can be used) for the duration of the COVID-19 declaration under Section 564(b)(1) of the Act, 21 U.S.C.section 360bbb-3(b)(1), unless the authorization is terminated  or revoked sooner.       Influenza A by PCR NEGATIVE NEGATIVE Final   Influenza B by PCR NEGATIVE NEGATIVE Final    Comment: (NOTE) The Xpert Xpress SARS-CoV-2/FLU/RSV plus assay is intended as an aid in the diagnosis of influenza from Nasopharyngeal swab specimens and should not be used as a sole basis for treatment. Nasal washings and aspirates are unacceptable for Xpert Xpress SARS-CoV-2/FLU/RSV testing.  Fact Sheet for Patients: EntrepreneurPulse.com.au  Fact Sheet for Healthcare Providers: IncredibleEmployment.be  This test is not yet approved or cleared by the [place] FDA and has been authorized for detection and/or diagnosis of SARS-CoV-2 by FDA under an Emergency Use Authorization (EUA). This EUA will remain in effect (meaning this test can be used) for the duration of the COVID-19 declaration under Section 564(b)(1) of the Act, 21 U.S.C. section 360bbb-3(b)(1), unless the authorization is terminated or revoked.  Performed at [facility], [place] [address] [place], [place] [address]   Blood Culture (routine x 2)     Status: None (Preliminary result)   Collection Time: 10/16/21  4:14 PM   Specimen: BLOOD  Result Value Ref Range Status   Specimen Description BLOOD RIGHT ANTECUBITAL  Final   Special Requests   Final    BOTTLES DRAWN AEROBIC ONLY Blood Culture adequate volume   Culture   Final    NO GROWTH 2 DAYS Performed at [facility], [place] [address] [place], [place] [address]    Report Status PENDING  Incomplete  Urine Culture     Status: None   Collection Time: 10/17/21  3:20 AM   Specimen: In/Out Cath Urine  Result Value Ref Range Status   Specimen Description IN/OUT CATH URINE  Final   Special Requests NONE  Final   Culture   Final    NO GROWTH Performed at [facility], [place] [address], [place], [place] [address]    Report Status 10/18/2021 FINAL  Final  MRSA Next Gen by PCR, Nasal     Status: None   Collection Time: 10/17/21  9:01 AM   Specimen: Nasal Mucosa; Nasal Swab  Result Value Ref Range Status   MRSA by PCR Next Gen NOT DETECTED NOT DETECTED Final    Comment: (NOTE) The GeneXpert MRSA Assay (FDA approved for NASAL specimens only), is one component of a comprehensive MRSA colonization surveillance program. It is not intended to diagnose MRSA infection nor to guide or monitor treatment for MRSA infections. Test performance is not FDA approved in patients less than 68 years old. Performed at [facility], [place] [address], [place], [place] [address]      Radiology Studies: No results found.   Scheduled Meds:  chlorhexidine gluconate (MEDLINE KIT)  15 mL Mouth Rinse BID   Chlorhexidine Gluconate Cloth  6 each Topical Daily   insulin aspart  0-9 Units Subcutaneous TID WC   lacosamide  200 mg Oral BID   lactulose  300 mL Rectal Daily   lamoTRIgine  225 mg Oral BID   levETIRAcetam  750 mg Oral BID   mouth rinse  15 mL Mouth Rinse 10 times per day   pantoprazole (PROTONIX) IV  40 mg  Intravenous Q12H   polyethylene glycol  17 g Oral Daily     LOS: 3 days   Little Ishikawa, DO [facility] Pager: Secure chat  If 7PM-7AM, please contact night-coverage [email or web address]  10/19/2021, 7:58 AM

## 2021-10-19 NOTE — Progress Notes (Signed)
PROGRESS NOTE FOR [facility]  Subjective: No acute events.  Objective: Vital signs in last 24 hours: Temp:  [97.6 F (36.4 C)-99.1 F (37.3 C)] 98 F (36.7 C) (12/03 0711) Pulse Rate:  [81-114] 97 (12/03 0711) Resp:  [11-20] 17 (12/03 0711) BP: (98-110)/(59-84) 107/78 (12/03 0711) SpO2:  [94 %-100 %] 94 % (12/03 0711)    Intake/Output from previous day: 12/02 0701 - 12/03 0700 In: 902.5 [I.V.:902.5] Out: 850 [Urine:850] Intake/Output this shift: No intake/output data recorded.  General appearance: moves to touch GI: soft, non-tender; bowel sounds normal; no masses,  no organomegaly  Lab Results: Recent Labs    10/17/21 0548 10/18/21 0414 10/18/21 1025 10/18/21 1841 10/19/21 0511  WBC 11.2* 7.6  --   --  8.2  HGB 11.6* 10.4* 9.6* 8.8* 9.0*  HCT 39.7 34.5* 30.8* 29.0* 29.4*  PLT 61* 95*  --   --  104*   BMET Recent Labs    10/17/21 1825 10/18/21 0414 10/19/21 0511  NA 152* 152* 145  K 4.4 3.4* 3.2*  CL 122* 124* 114*  CO2 _0 GLUCOSE 88 85 90  BUN 40* 30* 14  CREATININE 0.72 0.83 0.62  CALCIUM 7.4* 7.7* 7.9*   LFT Recent Labs    10/17/21 0548  PROT 5.5*  ALBUMIN 2.2*  AST 29  ALT 10  ALKPHOS 40  BILITOT 0.5   PT/INR Recent Labs    10/16/21 1611  LABPROT 16.2*  INR 1.3*   Hepatitis Panel No results for input(s): HEPBSAG, HCVAB, HEPAIGM, HEPBIGM in the last 72 hours. C-Diff No results for input(s): CDIFFTOX in the last 72 hours. Fecal Lactopherrin No results for input(s): FECLLACTOFRN in the last 72 hours.  Studies/Results: No results found.  Medications: Scheduled:  chlorhexidine gluconate (MEDLINE KIT)  15 mL Mouth Rinse BID   Chlorhexidine Gluconate Cloth  6 each Topical Daily   insulin aspart  0-9 Units Subcutaneous TID WC   lacosamide  200 mg Oral BID   lactulose  300 mL Rectal Daily   lamoTRIgine  225 mg Oral BID   levETIRAcetam  750 mg Oral BID   mouth rinse  15 mL Mouth Rinse 10 times per day   pantoprazole  (PROTONIX) IV  40 mg Intravenous Q12H   polyethylene glycol  17 g Oral Daily   Continuous:  potassium chloride      Assessment/Plan: 1) Anemia - stable. 2) Seizure disorder. 3) Cerebral Palsy.   Nursing did not report any bleeding.  His HGB is relatively stable.  Plan: 1) Continue to monitor HGB and transfuse as needed. 2) If rebleeding occurs imaging will be required, CTA versus RBC scan.    LOS: 3 days   Carlos [name] 10/19/2021, 8:32 AM

## 2021-10-20 ENCOUNTER — Encounter (HOSPITAL_COMMUNITY): Payer: Self-pay | Admitting: Gastroenterology

## 2021-10-20 LAB — CBC
HCT: 30.5 % — ABNORMAL LOW (ref 39.0–52.0)
Hemoglobin: 9.8 g/dL — ABNORMAL LOW (ref 13.0–17.0)
MCH: 24.9 pg — ABNORMAL LOW (ref 26.0–34.0)
MCHC: 32.1 g/dL (ref 30.0–36.0)
MCV: 77.6 fL — ABNORMAL LOW (ref 80.0–100.0)
Platelets: 142 10*3/uL — ABNORMAL LOW (ref 150–400)
RBC: 3.93 MIL/uL — ABNORMAL LOW (ref 4.22–5.81)
RDW: 12.7 % (ref 11.5–15.5)
WBC: 7.8 10*3/uL (ref 4.0–10.5)
nRBC: 0 % (ref 0.0–0.2)

## 2021-10-20 LAB — GLUCOSE, CAPILLARY
Glucose-Capillary: 96 mg/dL (ref 70–99)
Glucose-Capillary: 101 mg/dL — ABNORMAL HIGH (ref 70–99)
Glucose-Capillary: 112 mg/dL — ABNORMAL HIGH (ref 70–99)
Glucose-Capillary: 112 mg/dL — ABNORMAL HIGH (ref 70–99)
Glucose-Capillary: 80 mg/dL (ref 70–99)

## 2021-10-20 LAB — BASIC METABOLIC PANEL
Anion gap: 9 (ref 5–15)
BUN: 10 mg/dL (ref 6–20)
CO2: 23 mmol/L (ref 22–32)
Calcium: 8.2 mg/dL — ABNORMAL LOW (ref 8.9–10.3)
Chloride: 110 mmol/L (ref 98–111)
Creatinine, Ser: 0.58 mg/dL — ABNORMAL LOW (ref 0.61–1.24)
GFR, Estimated: >60 mL/min (ref >60)
Glucose, Bld: 133 mg/dL — ABNORMAL HIGH (ref 70–99)
Potassium: 2.8 mmol/L — ABNORMAL LOW (ref 3.5–5.1)
Sodium: 142 mmol/L (ref 135–145)

## 2021-10-20 LAB — HEMOGLOBIN AND HEMATOCRIT, BLOOD
HCT: 28.4 % — ABNORMAL LOW (ref 39.0–52.0)
HCT: 27.4 % — ABNORMAL LOW (ref 39.0–52.0)
Hemoglobin: 9 g/dL — ABNORMAL LOW (ref 13.0–17.0)
Hemoglobin: 8.7 g/dL — ABNORMAL LOW (ref 13.0–17.0)

## 2021-10-20 MED ORDER — POTASSIUM CHLORIDE 10 MEQ/100ML IV SOLN
10.0000 meq | INTRAVENOUS | Status: AC
  Administered 2021-10-20 (×4): 10 meq via INTRAVENOUS
  Filled 2021-10-20 (×4): qty 100

## 2021-10-20 NOTE — Progress Notes (Signed)
PROGRESS NOTE    Carlos [name]  YJE:[id number] DOB: July 25, 1986 DOA: 10/16/2021 PCP: Pcp, No   Brief Narrative:  Carlos [name] is a 35 y.o. 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  Patient apparently has been having breakthrough seizures per discussion at admission per mother who is now been admitted to the hospital herself, concern the patient had been missing home medications due to her illness at home while patient's father has been out of town.   Assessment & Plan:   Acute anemia likely secondary to blood loss, rule out GI bleed, stabilizing -GI following - upper endoscopy remarkable for esophagitis, severe non-ulcerated gastritis without overt bleeding. Flex sig without overt findings. Continue PPI. -Discontinue DVT prophylaxis with heparin but otherwise no clear indications or etiology for acute bleed -H/H previously downtrending - now normalizing (Baseline around 14) - currently 9.8  Acute metabolic encephalopathy on known baseline cerebral palsy, POA, resolving -Likely in the setting of acute breakthrough seizure as below - back to baseline per father -Rule out polypharmacy -Unlikely infectious process, discontinue antibiotics given patient remains without fever; leukocytosis minimal likely in the setting of hemoconcentration given below -Hyperammonemia, mild, continue lactulose rectally if unable to take p.o. appropriately or safely (enema currently on hold due to GI bleed).   Acute breakthrough seizure, concern for medication noncompliance/mis-administration -IV Keppra and IV Vimpat for now - transition back to PO once safely able to transition -Neurology following, appreciate insight and recommendations -Keppra level minimally elevated, Lamictal level within normal limits -As needed lorazepam   Hypovolemic hypernatremia, severe Hemoconcentration in the setting  of profound dehydration and poor p.o. intake Concurrent lactic acidosis Hypokalemia, ongoing -Hyponatremia, lactic acidosis downtrending with IV fluids  -Continue half-normal saline, follow sodium -currently downtrending appropriately  -Continue potassium supplementation as indicated -Likely secondary to poor PO intake over the past week   AKI, Resolved -Continue aggressive IV fluids as above   Questionable acute pancreatitis -Based on imaging, physical exam somewhat limited given patient's baseline mental status with acute metabolic encephalopathy overlying  -Imaging unremarkable for acute findings to explain pancreatitis with normal-appearing gallbladder and common bile duct within normal limits  -Advance diet as tolerated, IV fluid ongoing until PO intake picks up appropriately -Lipase downtrending appropriately   Reported history of diabetes, well controlled A1c 6.1 Continue sliding scale close monitoring while n.p.o., hypoglycemic protocol ongoing   Chronic severe protein calorie malnutrition -NPO until able to take p.o. safely, speech to follow -Feeding tube discussed at intake   DVT prophylaxis: Lovenox Code Status: Full code Family Communication: None present, mother has been admitted to the hospital in the interim; unable to reach father at this time  Status is: Inpatient  Dispo: The patient is from: Home              Anticipated d/c is to: To be determined              Anticipated d/c date is: 24-48h              Patient currently not medically stable for discharge  Consultants:  Neurology, GI  Procedures:  None  Antimicrobials:  Discontinued 10/17/2021  Subjective: No acute issues or events overnight, patient much more awake and alert, appears to be approaching baseline no further episodes of GI bleed reported per staff.  Review of systems limited given patient's baseline mental status.  Objective: Vitals:   10/19/21 1447 10/19/21  1928 10/19/21 2328  10/20/21 0332  BP: 107/64 114/71 94/67 103/62  Pulse: 94 (!) 101 95 (!) 107  Resp: 18 15 17 20   Temp: 98.4 F (36.9 C) 98.4 F (36.9 C) 98.1 F (36.7 C) 97.7 F (36.5 C)  TempSrc: Oral Axillary Axillary Axillary  SpO2: 99%  98% 98%    Intake/Output Summary (Last 24 hours) at 10/20/2021 9678 Last data filed at 10/20/2021 9381 Gross per 24 hour  Intake 120 ml  Output 750 ml  Net -630 ml    There were no vitals filed for this visit.  Examination:  General: Cachectic appearing gentleman, pleasantly resting in bed, No acute distress.  Awake alert, orientation unable to be obtained HEENT:  Normocephalic atraumatic.  Sclerae nonicteric, noninjected.  Extraocular movements intact bilaterally. Neck:  Without mass or deformity.  Trachea is midline. Lungs:  Clear to auscultate bilaterally without rhonchi, wheeze, or rales. Heart: Tachycardic, regular rhythm without murmurs rubs or gallops Abdomen:  Soft, nontender, nondistended.  Without guarding or rebound. Extremities: Without overt edema, cyanosis or clubbing. Vascular:  Dorsalis pedis and posterior tibial pulses palpable bilaterally. Skin:  Warm and dry, no erythema, multiple fluid-filled large bullae over patient's wrist knees ankles, see nursing documentation  Data Reviewed: I have personally reviewed following labs and imaging studies  CBC: Recent Labs  Lab 10/16/21 1456 10/16/21 1504 10/17/21 0548 10/18/21 0414 10/18/21 1025 10/18/21 1841 10/19/21 0511 10/19/21 1042 10/19/21 1822 10/20/21 0605  WBC 12.8*  --  11.2* 7.6  --   --  8.2  --   --  7.8  NEUTROABS 10.3*  --   --   --   --   --   --   --   --   --   HGB 13.5   < > 11.6* 10.4*   < > 8.8* 9.0* 8.6* 8.8* 9.8*  HCT 48.1   < > 39.7 34.5*   < > 29.0* 29.4* 26.3* 27.7* 30.5*  MCV 87.8  --  85.7 81.9  --   --  79.2*  --   --  77.6*  PLT 100*  --  61* 95*  --   --  104*  --   --  142*   < > = values in this interval not displayed.    Basic Metabolic  Panel: Recent Labs  Lab 10/17/21 1521 10/17/21 1825 10/18/21 0414 10/19/21 0511 10/20/21 0605  NA 152* 152* 152* 145 142  K 4.7 4.4 3.4* 3.2* 2.8*  CL 121* 122* 124* 114* 110  CO2 23 22 23 23 23   GLUCOSE 89 88 85 90 133*  BUN 46* 40* 30* 14 10  CREATININE 0.75 0.72 0.83 0.62 0.58*  CALCIUM 7.5* 7.4* 7.7* 7.9* 8.2*    GFR: CrCl cannot be calculated (Unknown ideal weight.). Liver Function Tests: Recent Labs  Lab 10/16/21 1456 10/17/21 0548  AST 36 29  ALT 13 10  ALKPHOS 44 40  BILITOT 0.6 0.5  PROT 7.3 5.5*  ALBUMIN 3.0* 2.2*    Recent Labs  Lab 10/16/21 1611 10/17/21 0548  LIPASE 186* 117*    Recent Labs  Lab 10/16/21 2046  AMMONIA 46*    Coagulation Profile: Recent Labs  Lab 10/16/21 1611  INR 1.3*    Cardiac Enzymes: No results for input(s): CKTOTAL, CKMB, CKMBINDEX, TROPONINI in the last 168 hours. BNP (last 3 results) No results for input(s): PROBNP in the last 8760 hours. HbA1C: No results for input(s): HGBA1C in the last 72 hours.  CBG: Recent Labs  Lab 10/19/21 1217 10/19/21 1613 10/19/21 1935 10/19/21 2327 10/20/21 0333  GLUCAP 79 84 85 82 96    Lipid Profile: No results for input(s): CHOL, HDL, LDLCALC, TRIG, CHOLHDL, LDLDIRECT in the last 72 hours.  Thyroid Function Tests: No results for input(s): TSH, T4TOTAL, FREET4, T3FREE, THYROIDAB in the last 72 hours. Anemia Panel: No results for input(s): VITAMINB12, FOLATE, FERRITIN, TIBC, IRON, RETICCTPCT in the last 72 hours. Sepsis Labs: Recent Labs  Lab 10/16/21 1611 10/16/21 2046  LATICACIDVEN 5.1* 4.1*     Recent Results (from the past 240 hour(s))  Resp Panel by RT-PCR (Flu A&B, Covid) Nasopharyngeal Swab     Status: None   Collection Time: 10/16/21  2:54 PM   Specimen: Nasopharyngeal Swab; Nasopharyngeal(NP) swabs in vial transport medium  Result Value Ref Range Status   SARS Coronavirus 2 by RT PCR NEGATIVE NEGATIVE Final    Comment: (NOTE) SARS-CoV-2 target  nucleic acids are NOT DETECTED.  The SARS-CoV-2 RNA is generally detectable in upper respiratory specimens during the acute phase of infection. The lowest concentration of SARS-CoV-2 viral copies this assay can detect is 138 copies/mL. A negative result does not preclude SARS-Cov-2 infection and should not be used as the sole basis for treatment or other patient management decisions. A negative result may occur with  improper specimen collection/handling, submission of specimen other than nasopharyngeal swab, presence of viral mutation(s) within the areas targeted by this assay, and inadequate number of viral copies(<138 copies/mL). A negative result must be combined with clinical observations, patient history, and epidemiological information. The expected result is Negative.  Fact Sheet for Patients:  EntrepreneurPulse.com.au  Fact Sheet for Healthcare Providers:  IncredibleEmployment.be  This test is no t yet approved or cleared by the [place] FDA and  has been authorized for detection and/or diagnosis of SARS-CoV-2 by FDA under an Emergency Use Authorization (EUA). This EUA will remain  in effect (meaning this test can be used) for the duration of the COVID-19 declaration under Section 564(b)(1) of the Act, 21 U.S.C.section 360bbb-3(b)(1), unless the authorization is terminated  or revoked sooner.       Influenza A by PCR NEGATIVE NEGATIVE Final   Influenza B by PCR NEGATIVE NEGATIVE Final    Comment: (NOTE) The Xpert Xpress SARS-CoV-2/FLU/RSV plus assay is intended as an aid in the diagnosis of influenza from Nasopharyngeal swab specimens and should not be used as a sole basis for treatment. Nasal washings and aspirates are unacceptable for Xpert Xpress SARS-CoV-2/FLU/RSV testing.  Fact Sheet for Patients: EntrepreneurPulse.com.au  Fact Sheet for Healthcare  Providers: IncredibleEmployment.be  This test is not yet approved or cleared by the [place] FDA and has been authorized for detection and/or diagnosis of SARS-CoV-2 by FDA under an Emergency Use Authorization (EUA). This EUA will remain in effect (meaning this test can be used) for the duration of the COVID-19 declaration under Section 564(b)(1) of the Act, 21 U.S.C. section 360bbb-3(b)(1), unless the authorization is terminated or revoked.  Performed at [facility], [place] [address], [place], [place] [address]   Blood Culture (routine x 2)     Status: None (Preliminary result)   Collection Time: 10/16/21  4:14 PM   Specimen: BLOOD  Result Value Ref Range Status   Specimen Description BLOOD RIGHT ANTECUBITAL  Final   Special Requests   Final    BOTTLES DRAWN AEROBIC ONLY Blood Culture adequate volume   Culture   Final    NO GROWTH 3  DAYS Performed at [facility], [place] [address] [place], [place] [address]    Report Status PENDING  Incomplete  Urine Culture     Status: None   Collection Time: 10/17/21  3:20 AM   Specimen: In/Out Cath Urine  Result Value Ref Range Status   Specimen Description IN/OUT CATH URINE  Final   Special Requests NONE  Final   Culture   Final    NO GROWTH Performed at [facility], [place] [address] [place], [place] [address]    Report Status 10/18/2021 FINAL  Final  MRSA Next Gen by PCR, Nasal     Status: None   Collection Time: 10/17/21  9:01 AM   Specimen: Nasal Mucosa; Nasal Swab  Result Value Ref Range Status   MRSA by PCR Next Gen NOT DETECTED NOT DETECTED Final    Comment: (NOTE) The GeneXpert MRSA Assay (FDA approved for NASAL specimens only), is one component of a comprehensive MRSA colonization surveillance program. It is not intended to diagnose MRSA infection nor to guide or monitor treatment for MRSA infections. Test performance is not FDA approved in patients less than 52 years old. Performed  at [facility], [place] [address], [place], [place] [address]      Radiology Studies: No results found.   Scheduled Meds:  chlorhexidine gluconate (MEDLINE KIT)  15 mL Mouth Rinse BID   Chlorhexidine Gluconate Cloth  6 each Topical Daily   insulin aspart  0-9 Units Subcutaneous TID WC   lacosamide  200 mg Oral BID   lamoTRIgine  225 mg Oral BID   levETIRAcetam  750 mg Oral BID   mouth rinse  15 mL Mouth Rinse 10 times per day   pantoprazole (PROTONIX) IV  40 mg Intravenous Q12H   polyethylene glycol  17 g Oral Daily     LOS: 4 days   Little Ishikawa, DO [facility] Pager: Secure chat  If 7PM-7AM, please contact night-coverage [email or web address]  10/20/2021, 8:07 AM

## 2021-10-20 NOTE — Progress Notes (Signed)
Subjective: No acute events.  Objective: Vital signs in last 24 hours: Temp:  [97.7 F (36.5 C)-98.4 F (36.9 C)] 97.7 F (36.5 C) (12/04 0332) Pulse Rate:  [94-108] 107 (12/04 0332) Resp:  [15-20] 20 (12/04 0332) BP: (94-118)/(62-85) 103/62 (12/04 0332) SpO2:  [98 %-100 %] 98 % (12/04 0332)    Intake/Output from previous day: 12/03 0701 - 12/04 0700 In: 120 [P.O.:120] Out: 750 [Urine:750] Intake/Output this shift: No intake/output data recorded.  General appearance: sleeping GI: soft, non-tender; bowel sounds normal; no masses,  no organomegaly  Lab Results: Recent Labs    10/18/21 0414 10/18/21 1025 10/19/21 0511 10/19/21 1042 10/19/21 1822 10/20/21 0605  WBC 7.6  --  8.2  --   --  7.8  HGB 10.4*   < > 9.0* 8.6* 8.8* 9.8*  HCT 34.5*   < > 29.4* 26.3* 27.7* 30.5*  PLT 95*  --  104*  --   --  142*   < > = values in this interval not displayed.   BMET Recent Labs    10/18/21 0414 10/19/21 0511 10/20/21 0605  NA 152* 145 142  K 3.4* 3.2* 2.8*  CL 124* 114* 110  CO2 _0 GLUCOSE 85 90 133*  BUN 30* 14 10  CREATININE 0.83 0.62 0.58*  CALCIUM 7.7* 7.9* 8.2*   LFT No results for input(s): PROT, ALBUMIN, AST, ALT, ALKPHOS, BILITOT, BILIDIR, IBILI in the last 72 hours. PT/INR No results for input(s): LABPROT, INR in the last 72 hours. Hepatitis Panel No results for input(s): HEPBSAG, HCVAB, HEPAIGM, HEPBIGM in the last 72 hours. C-Diff No results for input(s): CDIFFTOX in the last 72 hours. Fecal Lactopherrin No results for input(s): FECLLACTOFRN in the last 72 hours.  Studies/Results: No results found.  Medications: Scheduled:  chlorhexidine gluconate (MEDLINE KIT)  15 mL Mouth Rinse BID   Chlorhexidine Gluconate Cloth  6 each Topical Daily   insulin aspart  0-9 Units Subcutaneous TID WC   lacosamide  200 mg Oral BID   lamoTRIgine  225 mg Oral BID   levETIRAcetam  750 mg Oral BID   mouth rinse  15 mL Mouth Rinse 10 times per day    pantoprazole (PROTONIX) IV  40 mg Intravenous Q12H   polyethylene glycol  17 g Oral Daily   Continuous:  Assessment/Plan: 1) GI bleed. 2) Anemia. 3) CP.   The patient is stable.  His HGB is at 9.8 g/dL.  Nursing did not report any bleeding overnight.  Plan: 1) Monitor HGB and transfuse if necessary. 2) Signing off.  LOS: 4 days   Tymira Horkey D 10/20/2021, 8:03 AM

## 2021-10-21 LAB — GLUCOSE, CAPILLARY
Glucose-Capillary: 107 mg/dL — ABNORMAL HIGH (ref 70–99)
Glucose-Capillary: 107 mg/dL — ABNORMAL HIGH (ref 70–99)
Glucose-Capillary: 125 mg/dL — ABNORMAL HIGH (ref 70–99)
Glucose-Capillary: 84 mg/dL (ref 70–99)
Glucose-Capillary: 93 mg/dL (ref 70–99)

## 2021-10-21 LAB — CBC
HCT: 27.2 % — ABNORMAL LOW (ref 39.0–52.0)
Hemoglobin: 8.7 g/dL — ABNORMAL LOW (ref 13.0–17.0)
MCH: 24.8 pg — ABNORMAL LOW (ref 26.0–34.0)
MCHC: 32 g/dL (ref 30.0–36.0)
MCV: 77.5 fL — ABNORMAL LOW (ref 80.0–100.0)
Platelets: 186 10*3/uL (ref 150–400)
RBC: 3.51 MIL/uL — ABNORMAL LOW (ref 4.22–5.81)
RDW: 13 % (ref 11.5–15.5)
WBC: 8.7 10*3/uL (ref 4.0–10.5)
nRBC: 0 % (ref 0.0–0.2)

## 2021-10-21 LAB — CULTURE, BLOOD (ROUTINE X 2)
Culture: NO GROWTH
Special Requests: ADEQUATE

## 2021-10-21 LAB — BASIC METABOLIC PANEL
Anion gap: 6 (ref 5–15)
BUN: 7 mg/dL (ref 6–20)
CO2: 25 mmol/L (ref 22–32)
Calcium: 8 mg/dL — ABNORMAL LOW (ref 8.9–10.3)
Chloride: 111 mmol/L (ref 98–111)
Creatinine, Ser: 0.5 mg/dL — ABNORMAL LOW (ref 0.61–1.24)
GFR, Estimated: >60 mL/min (ref >60)
Glucose, Bld: 110 mg/dL — ABNORMAL HIGH (ref 70–99)
Potassium: 3 mmol/L — ABNORMAL LOW (ref 3.5–5.1)
Sodium: 142 mmol/L (ref 135–145)

## 2021-10-21 LAB — HEMOGLOBIN AND HEMATOCRIT, BLOOD
HCT: 28.3 % — ABNORMAL LOW (ref 39.0–52.0)
Hemoglobin: 8.7 g/dL — ABNORMAL LOW (ref 13.0–17.0)

## 2021-10-21 MED ORDER — PANTOPRAZOLE 2 MG/ML SUSPENSION
40.0000 mg | Freq: Two times a day (BID) | ORAL | Status: DC
  Administered 2021-10-21 – 2021-10-31 (×20): 40 mg via ORAL
  Filled 2021-10-21 (×20): qty 20

## 2021-10-21 MED ORDER — POTASSIUM CHLORIDE 20 MEQ PO PACK
40.0000 meq | PACK | ORAL | Status: AC
  Filled 2021-10-21: qty 2

## 2021-10-21 NOTE — Social Work (Signed)
MD brought to CSW's attention that there have been some concerns noted and an APS report was requested. MD reports pt has had a massive weight loss and does have malnutrition. Pt noted to have odd blisters on his hands ankles and knees, MD not sure what they are.  Pt lives with his mother and father and has Cerebral Palsy, IDD, and quadriplegia at baseline. Father states pt hasn't eaten well the last few days, he was able to appropriately show staff how to feed pt, but stated to MD that " they kinda just put stuff in and it spills everywhere but some of it 'gets down' ". Pt's mother had been his primary caretaker the last few days while father was out of town, now mother is also hospitalized (ETOH). Per nursing and MD, father is difficult to get in contact with, when reached he sounds slow as if under the influence. Nursing did not note any bruises or suspicious injuries. It was reported that upon admission nursing noted that pt looked unkempt, but most concern is from pt's weight loss and inability to contract for safety in the home.  APS report was made to [place] at [facility]. She was advised that per MD note, pt would be ready to DC in 24 to 48 hours.

## 2021-10-21 NOTE — Progress Notes (Signed)
PROGRESS NOTE    JC VERON  ZTI:[id number] DOB: [birth year] DOA: 10/16/2021 PCP: Pcp, No   Brief Narrative:  Carlos [name] is a 35 y.o. 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  Patient apparently has been having breakthrough seizures per discussion at admission per mother who is now been admitted to the hospital herself, concern the patient had been missing home medications due to her illness at home while patient's father has been out of town.   Assessment & Plan:   Acute on chronic failure to thrive, profound oropharyngeal dysfunction  Concern for medications management -Attempted to contact patient's family multiple times, finally able to make contact with father -mother appears to be admitted to the ICU, father was out of town while mother was ill and patient subsequently missed multiple medications likely the cause for his acute breakthrough seizure - Patient's weight classifies him as severe protein caloric malnutrition  - 2019 weight 110lbs - 2021 91lbs; current weight pending -[facility] has been contacted to evaluate the home/living condition. Father has been asked to work with speech and show them how he has been feeding the patient at home.  Acute anemia likely secondary to blood loss, rule out GI bleed, stabilizing - GI following - upper endoscopy remarkable for esophagitis, severe non-ulcerated gastritis without overt bleeding. Flex sig without overt findings. Continue PPI. - Discontinue DVT prophylaxis with heparin but otherwise no clear indications or etiology for acute bleed. - H/H previously downtrending - now normalizing (Baseline around 14) - currently 8.7  Acute metabolic encephalopathy on known baseline cerebral palsy, POA, resolving -Likely in the setting of acute breakthrough seizure as below - back to baseline per father -Rule out polypharmacy -Unlikely  infectious process, discontinue antibiotics given patient remains without fever; leukocytosis minimal likely in the setting of hemoconcentration given below -Hyperammonemia, mild, continue lactulose rectally if unable to take p.o. appropriately or safely (enema currently on hold due to GI bleed).   Acute breakthrough seizure, concern for medication noncompliance/mis-administration -IV Keppra and IV Vimpat for now - transition back to PO once safely able to transition -Neurology following, appreciate insight and recommendations -Keppra level minimally elevated, Lamictal level within normal limits -As needed lorazepam   Hypovolemic hypernatremia, severe Hemoconcentration in the setting of profound dehydration and poor p.o. intake Concurrent lactic acidosis Hypokalemia, ongoing -Hyponatremia, lactic acidosis downtrending with IV fluids  -Continue half-normal saline, follow sodium -currently downtrending appropriately  -Continue potassium supplementation as indicated -Likely secondary to poor PO intake over the past week   AKI, Resolved -Continue aggressive IV fluids as above   Questionable acute pancreatitis -Based on imaging, physical exam somewhat limited given patient's baseline mental status with acute metabolic encephalopathy overlying  -Imaging unremarkable for acute findings to explain pancreatitis with normal-appearing gallbladder and common bile duct within normal limits  -Advance diet as tolerated, IV fluid ongoing until PO intake picks up appropriately -Lipase downtrending appropriately   Reported history of diabetes, well controlled A1c 6.1 Continue sliding scale close monitoring while n.p.o., hypoglycemic protocol ongoing   Chronic severe protein calorie malnutrition -Advancing diet as approved by speech - liquid diet at this time -Feeding tube not offered given patient's baseline - high risk for removal and limited benefit in quality of life with increased risk of  injury/infection.   DVT prophylaxis: Lovenox Code Status: Full code Family Communication: None present, mother has been admitted to the hospital in the interim; father  contacted over the phone but stopped responding(of note the phone did not disconnect) - he did not answer repeat call back  Status is: Inpatient  Dispo: The patient is from: Home              Anticipated d/c is to: To be determined              Anticipated d/c date is: 24-48h              Patient currently not medically stable for discharge  Consultants:  Neurology, GI  Procedures:  None  Antimicrobials:  Discontinued 10/17/2021  Subjective: No acute issues or events overnight -ROS limited 2/2 patient baseline  Objective: Vitals:   10/20/21 1433 10/20/21 1919 10/20/21 2311 10/21/21 0324  BP:  101/66 117/69 121/80  Pulse: (!) 103 (!) 103 (!) 107 98  Resp:  18 15 13   Temp: 99.8 F (37.7 C) 98 F (36.7 C) 98.2 F (36.8 C) 98 F (36.7 C)  TempSrc: Axillary Axillary Axillary Axillary  SpO2: 98%  99% 100%    Intake/Output Summary (Last 24 hours) at 10/21/2021 0747 Last data filed at 10/21/2021 0032 Gross per 24 hour  Intake 207.22 ml  Output 750 ml  Net -542.78 ml    There were no vitals filed for this visit.  Examination:  General: Cachectic appearing gentleman, resting in bed but moving head back and forth appearing agitated/anxious. Awake and alert but minimally reactive HEENT:  Normocephalic atraumatic.  Sclerae nonicteric, noninjected.  Extraocular movements intact bilaterally. Neck:  Without mass or deformity.  Trachea is midline. Lungs:  Clear to auscultate bilaterally without rhonchi, wheeze, or rales. Heart: Tachycardic, regular rhythm without murmurs rubs or gallops Abdomen:  Soft, nontender, nondistended.  Without guarding or rebound. Extremities: Without overt edema, cyanosis or clubbing. Vascular:  Dorsalis pedis and posterior tibial pulses palpable bilaterally. Skin:  Warm and dry, no  erythema, multiple fluid-filled large bullae over patient's wrist knees ankles, see nursing documentation  Data Reviewed: I have personally reviewed following labs and imaging studies  CBC: Recent Labs  Lab 10/16/21 1456 10/16/21 1504 10/17/21 0548 10/18/21 0414 10/18/21 1025 10/19/21 0511 10/19/21 1042 10/19/21 1822 10/20/21 0605 10/20/21 1023 10/20/21 1841 10/21/21 0426  WBC 12.8*  --  11.2* 7.6  --  8.2  --   --  7.8  --   --  8.7  NEUTROABS 10.3*  --   --   --   --   --   --   --   --   --   --   --   HGB 13.5   < > 11.6* 10.4*   < > 9.0*   < > 8.8* 9.8* 9.0* 8.7* 8.7*  HCT 48.1   < > 39.7 34.5*   < > 29.4*   < > 27.7* 30.5* 28.4* 27.4* 27.2*  MCV 87.8  --  85.7 81.9  --  79.2*  --   --  77.6*  --   --  77.5*  PLT 100*  --  61* 95*  --  104*  --   --  142*  --   --  186   < > = values in this interval not displayed.    Basic Metabolic Panel: Recent Labs  Lab 10/17/21 1825 10/18/21 0414 10/19/21 0511 10/20/21 0605 10/21/21 0426  NA 152* 152* 145 142 142  K 4.4 3.4* 3.2* 2.8* 3.0*  CL 122* 124* 114* 110 111  CO2 22 23 23 23  25  GLUCOSE 88 85 90 133* 110*  BUN 40* 30* 14 10 7   CREATININE 0.72 0.83 0.62 0.58* 0.50*  CALCIUM 7.4* 7.7* 7.9* 8.2* 8.0*    GFR: CrCl cannot be calculated (Unknown ideal weight.). Liver Function Tests: Recent Labs  Lab 10/16/21 1456 10/17/21 0548  AST 36 29  ALT 13 10  ALKPHOS 44 40  BILITOT 0.6 0.5  PROT 7.3 5.5*  ALBUMIN 3.0* 2.2*    Recent Labs  Lab 10/16/21 1611 10/17/21 0548  LIPASE 186* 117*    Recent Labs  Lab 10/16/21 2046  AMMONIA 46*    Coagulation Profile: Recent Labs  Lab 10/16/21 1611  INR 1.3*    Cardiac Enzymes: No results for input(s): CKTOTAL, CKMB, CKMBINDEX, TROPONINI in the last 168 hours. BNP (last 3 results) No results for input(s): PROBNP in the last 8760 hours. HbA1C: No results for input(s): HGBA1C in the last 72 hours.  CBG: Recent Labs  Lab 10/20/21 1121 10/20/21 1457  10/20/21 2118 10/21/21 0017 10/21/21 0655  GLUCAP 112* 112* 80 93 107*    Lipid Profile: No results for input(s): CHOL, HDL, LDLCALC, TRIG, CHOLHDL, LDLDIRECT in the last 72 hours.  Thyroid Function Tests: No results for input(s): TSH, T4TOTAL, FREET4, T3FREE, THYROIDAB in the last 72 hours. Anemia Panel: No results for input(s): VITAMINB12, FOLATE, FERRITIN, TIBC, IRON, RETICCTPCT in the last 72 hours. Sepsis Labs: Recent Labs  Lab 10/16/21 1611 10/16/21 2046  LATICACIDVEN 5.1* 4.1*     Recent Results (from the past 240 hour(s))  Resp Panel by RT-PCR (Flu A&B, Covid) Nasopharyngeal Swab     Status: None   Collection Time: 10/16/21  2:54 PM   Specimen: Nasopharyngeal Swab; Nasopharyngeal(NP) swabs in vial transport medium  Result Value Ref Range Status   SARS Coronavirus 2 by RT PCR NEGATIVE NEGATIVE Final    Comment: (NOTE) SARS-CoV-2 target nucleic acids are NOT DETECTED.  The SARS-CoV-2 RNA is generally detectable in upper respiratory specimens during the acute phase of infection. The lowest concentration of SARS-CoV-2 viral copies this assay can detect is 138 copies/mL. A negative result does not preclude SARS-Cov-2 infection and should not be used as the sole basis for treatment or other patient management decisions. A negative result may occur with  improper specimen collection/handling, submission of specimen other than nasopharyngeal swab, presence of viral mutation(s) within the areas targeted by this assay, and inadequate number of viral copies(<138 copies/mL). A negative result must be combined with clinical observations, patient history, and epidemiological information. The expected result is Negative.  Fact Sheet for Patients:  EntrepreneurPulse.com.au  Fact Sheet for Healthcare Providers:  IncredibleEmployment.be  This test is no t yet approved or cleared by the [place] FDA and  has been authorized for  detection and/or diagnosis of SARS-CoV-2 by FDA under an Emergency Use Authorization (EUA). This EUA will remain  in effect (meaning this test can be used) for the duration of the COVID-19 declaration under Section 564(b)(1) of the Act, 21 U.S.C.section 360bbb-3(b)(1), unless the authorization is terminated  or revoked sooner.       Influenza A by PCR NEGATIVE NEGATIVE Final   Influenza B by PCR NEGATIVE NEGATIVE Final    Comment: (NOTE) The Xpert Xpress SARS-CoV-2/FLU/RSV plus assay is intended as an aid in the diagnosis of influenza from Nasopharyngeal swab specimens and should not be used as a sole basis for treatment. Nasal washings and aspirates are unacceptable for Xpert Xpress SARS-CoV-2/FLU/RSV testing.  Fact Sheet for Patients: EntrepreneurPulse.com.au  Fact Sheet  for Healthcare Providers: IncredibleEmployment.be  This test is not yet approved or cleared by the [place] and has been authorized for detection and/or diagnosis of SARS-CoV-2 by FDA under an Emergency Use Authorization (EUA). This EUA will remain in effect (meaning this test can be used) for the duration of the COVID-19 declaration under Section 564(b)(1) of the Act, 21 U.S.C. section 360bbb-3(b)(1), unless the authorization is terminated or revoked.  Performed at [facility], [place] [address], [place], [place] [address]   Blood Culture (routine x 2)     Status: None (Preliminary result)   Collection Time: 10/16/21  4:14 PM   Specimen: BLOOD  Result Value Ref Range Status   Specimen Description BLOOD RIGHT ANTECUBITAL  Final   Special Requests   Final    BOTTLES DRAWN AEROBIC ONLY Blood Culture adequate volume   Culture   Final    NO GROWTH 4 DAYS Performed at [facility], [place] [address], [place], [place] [address]    Report Status PENDING  Incomplete  Urine Culture     Status: None   Collection Time: 10/17/21  3:20 AM   Specimen: In/Out Cath  Urine  Result Value Ref Range Status   Specimen Description IN/OUT CATH URINE  Final   Special Requests NONE  Final   Culture   Final    NO GROWTH Performed at [facility], [place] [address], [place], [place] [address]    Report Status 10/18/2021 FINAL  Final  MRSA Next Gen by PCR, Nasal     Status: None   Collection Time: 10/17/21  9:01 AM   Specimen: Nasal Mucosa; Nasal Swab  Result Value Ref Range Status   MRSA by PCR Next Gen NOT DETECTED NOT DETECTED Final    Comment: (NOTE) The GeneXpert MRSA Assay (FDA approved for NASAL specimens only), is one component of a comprehensive MRSA colonization surveillance program. It is not intended to diagnose MRSA infection nor to guide or monitor treatment for MRSA infections. Test performance is not FDA approved in patients less than 64 years old. Performed at [facility], [place] [address], [place],  [address]      Radiology Studies: No results found.   Scheduled Meds:  chlorhexidine gluconate (MEDLINE KIT)  15 mL Mouth Rinse BID   Chlorhexidine Gluconate Cloth  6 each Topical Daily   insulin aspart  0-9 Units Subcutaneous TID WC   lacosamide  200 mg Oral BID   lamoTRIgine  225 mg Oral BID   levETIRAcetam  750 mg Oral BID   mouth rinse  15 mL Mouth Rinse 10 times per day   pantoprazole (PROTONIX) IV  40 mg Intravenous Q12H   polyethylene glycol  17 g Oral Daily     LOS: 5 days   Little Ishikawa, DO [facility] Pager: Secure chat  If 7PM-7AM, please contact night-coverage [email or web address]  10/21/2021, 7:47 AM

## 2021-10-21 NOTE — Progress Notes (Signed)
Speech Language Pathology Treatment: Dysphagia  Patient Details Name: Carlos [name] MRN: [id number] DOB: February 11, 1986 Today's Date: 10/21/2021 Time: 7253-6644 SLP Time Calculation (min) (ACUTE ONLY): 14 min  Assessment / Plan / Recommendation Clinical Impression  Pt alert, clearly hungry and eager for PO. He has an open mouth posture at baseline. MD reported to SLP that the pt is typically fed by his parents and consumes a finely chopped diet and thin liquids. SLP found that pt could not achieve oral closure with any trials. He is unable to close his mouth on a spoon or a straw. SLP can provided total assist to close pts mouth, but pt groping behavior reopens his mouth and total assist strategy does not work. In order to introduce PO SLP had to pour liquids into his anterior oral cavity and pt tilted head back to allow for posterior spillage and multiple reflexive swallows. There was no contact between the lingual body and the palate making oral transit of pudding or puree impossible. When pudding placed on lingual surface pt was never able to propel it posteriorly despite total assist; SLP had to rinse pudding to the pharynx with liquids.   This cannot be pts baseline swallowing ability if he was being fed chopped solids at home. In fact, all of his prior notes report improved function than what was seen today. Question if pts ataxia has progressed significantly making his ability to eat and drink inconsistent. He is at high risk of failure to thrive and even aspiration, though he did not appear to aspirate today despite very unsafe method of intake. Will initiate a full liquid diet so staff and visiting family can attempt to feed that pt and document success when possible. Perhaps pt is worse today because he has not been offered PO in a while. Will continue efforts. Attempted to contact pts father x3.     HPI HPI: Carlos [name] is a 35 y.o. male with medical history significant of cerebral palsy  (agenesis of corpus callosum) with baseline quadriplegia and nonverbal, seizure disorder, came with AMS and multiple seizures.  Pt has had poor PO intake for at least 3 days PTA .  15 lb weight loss this year.  CXR and Head CT 11/30 with no acute findings.  Pt with concerns for GIB following bloody BM morning of 12/2.      SLP Plan         Recommendations for follow up therapy are one component of a multi-disciplinary discharge planning process, led by the attending physician.  Recommendations may be updated based on patient status, additional functional criteria and insurance authorization.    Recommendations  Diet recommendations: Thin liquid Liquids provided via: Cup Medication Administration: Crushed with puree Supervision: Trained caregiver to feed patient Compensations: Slow rate;Small sips/bites;Follow solids with liquid Postural Changes and/or Swallow Maneuvers: Seated upright 90 degrees;Upright 30-60 min after meal                Oral Care Recommendations: Oral care BID Follow Up Recommendations: Skilled nursing-short term rehab (<3 hours/day) Assistance recommended at discharge: Frequent or constant Supervision/Assistance SLP Visit Diagnosis: Dysphagia, oropharyngeal phase (R13.12)       GO               Harlon Ditty, MA CCC-SLP  Acute Rehabilitation Services Office [phone]  Claudine Mouton  10/21/2021, 11:25 AM

## 2021-10-22 LAB — GLUCOSE, CAPILLARY
Glucose-Capillary: 119 mg/dL — ABNORMAL HIGH (ref 70–99)
Glucose-Capillary: 132 mg/dL — ABNORMAL HIGH (ref 70–99)
Glucose-Capillary: 83 mg/dL (ref 70–99)
Glucose-Capillary: 97 mg/dL (ref 70–99)

## 2021-10-22 LAB — CBC
HCT: 28.7 % — ABNORMAL LOW (ref 39.0–52.0)
Hemoglobin: 8.9 g/dL — ABNORMAL LOW (ref 13.0–17.0)
MCH: 24.5 pg — ABNORMAL LOW (ref 26.0–34.0)
MCHC: 31 g/dL (ref 30.0–36.0)
MCV: 78.8 fL — ABNORMAL LOW (ref 80.0–100.0)
Platelets: 235 10*3/uL (ref 150–400)
RBC: 3.64 MIL/uL — ABNORMAL LOW (ref 4.22–5.81)
RDW: 13.2 % (ref 11.5–15.5)
WBC: 8.9 10*3/uL (ref 4.0–10.5)
nRBC: 0.2 % (ref 0.0–0.2)

## 2021-10-22 LAB — SURGICAL PATHOLOGY

## 2021-10-22 LAB — BASIC METABOLIC PANEL
Anion gap: 9 (ref 5–15)
BUN: 7 mg/dL (ref 6–20)
CO2: 25 mmol/L (ref 22–32)
Calcium: 8.2 mg/dL — ABNORMAL LOW (ref 8.9–10.3)
Chloride: 110 mmol/L (ref 98–111)
Creatinine, Ser: 0.54 mg/dL — ABNORMAL LOW (ref 0.61–1.24)
GFR, Estimated: >60 mL/min (ref >60)
Glucose, Bld: 94 mg/dL (ref 70–99)
Potassium: 3 mmol/L — ABNORMAL LOW (ref 3.5–5.1)
Sodium: 144 mmol/L (ref 135–145)

## 2021-10-22 MED ORDER — POTASSIUM CHLORIDE 10 MEQ/100ML IV SOLN
10.0000 meq | INTRAVENOUS | Status: AC
  Administered 2021-10-22 (×4): 10 meq via INTRAVENOUS
  Filled 2021-10-22 (×4): qty 100

## 2021-10-22 MED ORDER — ADULT MULTIVITAMIN W/MINERALS CH
1.0000 | ORAL_TABLET | Freq: Every day | ORAL | Status: DC
  Administered 2021-10-23 – 2021-10-31 (×9): 1 via ORAL
  Filled 2021-10-22 (×9): qty 1

## 2021-10-22 MED ORDER — ENSURE ENLIVE PO LIQD
237.0000 mL | Freq: Three times a day (TID) | ORAL | Status: DC
  Administered 2021-10-22 – 2021-10-31 (×33): 237 mL via ORAL

## 2021-10-22 NOTE — Progress Notes (Signed)
PROGRESS NOTE    Carlos [name]  VPX:[id number] DOB: 02-28-1986 DOA: 10/16/2021 PCP: Pcp, No   Brief Narrative:  Carlos [name] is a 35 y.o. 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  Patient apparently has been having breakthrough seizures per discussion at admission per mother who is now been admitted to the hospital herself, concern the patient had been missing home medications due to her illness at home while patient's father has been out of town.   Assessment & Plan:   Acute on chronic failure to thrive, profound oropharyngeal dysfunction  Concern for medications management -Attempted to contact patient's family multiple times, finally able to make contact with father -Mother appears to be admitted to the ICU for alcohol withdrawal and pneumonia, father was out of town during this timeframe and patient subsequently missed multiple medications and was cared for poorly (limited feedings) likely the cause for his acute breakthrough seizure and current mental status - Patient's weight classifies him as severe protein caloric malnutrition  - 2019 weight 110lbs - 2021 91lbs; most recent weight 97lbs -APS has been contacted to evaluate the home/living condition. Father has been asked to work with speech and show them how he has been feeding the patient at home to ensure safe feeding. -Have asked father to be present for speech evaluation to show staff how they have been feeding the patient at home given SLP eval that he cannot take PO safely - currently limited to liquids with high risk for aspiration.  -Patient may benefit from feeding tube evaluation but goals of care will need to be discussed first - father unavailable by phone again today despite multiple calls  Goals of care -Attempting to follow up with father about goals of care, no answer despite multiple calls -Patient is  bed bound, total assist, poorly interactive, high risk for aspiration but a poor candidate for PEG/similar tube placement given mental status and high risk for self-injury and infection -If agreeable will have palliative follow for planning, even if distant, given patient's CP diagnosis and current clinical status - consult placement awaiting discussion with family who have been unavailable.  Acute anemia likely secondary to blood loss, rule out GI bleed, stabilizing - GI following - upper endoscopy remarkable for esophagitis, severe non-ulcerated gastritis without overt bleeding. Flex sig without overt findings. Continue PPI. - Discontinue DVT prophylaxis with heparin but otherwise no clear indications or etiology for acute bleed. - H/H previously downtrending - now normalizing (Baseline around 14) - currently 8.7  Acute metabolic encephalopathy on known baseline cerebral palsy, POA, resolving -Likely in the setting of acute breakthrough seizure as below - back to baseline per father -Rule out polypharmacy -Unlikely infectious process, discontinue antibiotics given patient remains without fever; leukocytosis minimal likely in the setting of hemoconcentration given below -Hyperammonemia, mild, continue lactulose rectally if unable to take p.o. appropriately or safely (enema currently on hold due to GI bleed).   Acute breakthrough seizure, concern for medication noncompliance/mis-administration -IV Keppra and IV Vimpat for now - transition back to PO once safely able to transition -Neurology following, appreciate insight and recommendations -Keppra level minimally elevated, Lamictal level within normal limits -As needed lorazepam   Hypovolemic hypernatremia, severe Hemoconcentration in the setting of profound dehydration and poor p.o. intake Concurrent lactic acidosis Hypokalemia, ongoing -Hyponatremia, lactic acidosis downtrending with IV fluids  -Continue half-normal saline, follow sodium  -currently downtrending appropriately  -Continue potassium supplementation  as indicated -Likely secondary to poor PO intake over the past week   AKI, Resolved -Continue aggressive IV fluids as above   Questionable acute pancreatitis -Based on imaging, physical exam somewhat limited given patient's baseline mental status with acute metabolic encephalopathy overlying  -Imaging unremarkable for acute findings to explain pancreatitis with normal-appearing gallbladder and common bile duct within normal limits  -Advance diet as tolerated, IV fluid ongoing until PO intake picks up appropriately -Lipase downtrending appropriately   Reported history of diabetes, well controlled A1c 6.1 Continue sliding scale close monitoring while n.p.o., hypoglycemic protocol ongoing   Chronic severe protein calorie malnutrition -Advancing diet as approved by speech - liquid diet at this time -Feeding tube not offered given patient's baseline - high risk for removal and limited benefit in quality of life with increased risk of injury/infection.   DVT prophylaxis: Lovenox Code Status: Full code Family Communication: None present, mother has been admitted to the hospital in the interim; father contacted over the phone again today and the call disconnected immediately.  Status is: Inpatient  Dispo: The patient is from: Home              Anticipated d/c is to: To be determined              Anticipated d/c date is: 24-48h              Patient currently not medically stable for discharge  Consultants:  Neurology, GI  Procedures:  Upper endoscopy  Antimicrobials:  Discontinued 10/17/2021  Subjective: No acute issues or events overnight -ROS limited 2/2 patient baseline  Objective: Vitals:   10/21/21 2347 10/22/21 0012 10/22/21 0405 10/22/21 0416  BP: 112/70 121/66 122/69   Pulse: (!) 111 (!) 109 (!) 107   Resp: 12 19 18    Temp: 98.9 F (37.2 C) 98.9 F (37.2 C) 98.8 F (37.1 C)   TempSrc:  Axillary Axillary Axillary   SpO2: 96% 100% 99%   Weight:    44.6 kg    Intake/Output Summary (Last 24 hours) at 10/22/2021 0754 Last data filed at 10/22/2021 0653 Gross per 24 hour  Intake 0 ml  Output 1250 ml  Net -1250 ml    Filed Weights   10/21/21 1627 10/22/21 0416  Weight: 44.4 kg 44.6 kg    Examination:  General: Cachectic appearing gentleman, resting in bed but moving head back and forth appearing agitated/anxious. Awake and alert but minimally reactive HEENT:  Normocephalic atraumatic.  Sclerae nonicteric, noninjected.  Extraocular movements intact bilaterally. Neck:  Without mass or deformity.  Trachea is midline. Lungs:  Clear to auscultate bilaterally without rhonchi, wheeze, or rales. Heart: Tachycardic, regular rhythm without murmurs rubs or gallops Abdomen:  Soft, nontender, nondistended.  Without guarding or rebound. Extremities: Without overt edema, cyanosis or clubbing. Vascular:  Dorsalis pedis and posterior tibial pulses palpable bilaterally. Skin:  Warm and dry, no erythema, multiple fluid-filled large bullae over patient's wrist knees ankles, see nursing documentation  Data Reviewed: I have personally reviewed following labs and imaging studies  CBC: Recent Labs  Lab 10/16/21 1456 10/16/21 1504 10/18/21 0414 10/18/21 1025 10/19/21 0511 10/19/21 1042 10/20/21 0605 10/20/21 1023 10/20/21 1841 10/21/21 0426 10/21/21 1126 10/22/21 0246  WBC 12.8*   < > 7.6  --  8.2  --  7.8  --   --  8.7  --  8.9  NEUTROABS 10.3*  --   --   --   --   --   --   --   --   --   --   --  HGB 13.5   < > 10.4*   < > 9.0*   < > 9.8* 9.0* 8.7* 8.7* 8.7* 8.9*  HCT 48.1   < > 34.5*   < > 29.4*   < > 30.5* 28.4* 27.4* 27.2* 28.3* 28.7*  MCV 87.8   < > 81.9  --  79.2*  --  77.6*  --   --  77.5*  --  78.8*  PLT 100*   < > 95*  --  104*  --  142*  --   --  186  --  235   < > = values in this interval not displayed.    Basic Metabolic Panel: Recent Labs  Lab 10/18/21 0414  10/19/21 0511 10/20/21 0605 10/21/21 0426 10/22/21 0246  NA 152* 145 142 142 144  K 3.4* 3.2* 2.8* 3.0* 3.0*  CL 124* 114* 110 111 110  CO2 23 23 23 25 25   GLUCOSE 85 90 133* 110* 94  BUN 30* 14 10 7 7   CREATININE 0.83 0.62 0.58* 0.50* 0.54*  CALCIUM 7.7* 7.9* 8.2* 8.0* 8.2*    GFR: CrCl cannot be calculated (Unknown ideal weight.). Liver Function Tests: Recent Labs  Lab 10/16/21 1456 10/17/21 0548  AST 36 29  ALT 13 10  ALKPHOS 44 40  BILITOT 0.6 0.5  PROT 7.3 5.5*  ALBUMIN 3.0* 2.2*    Recent Labs  Lab 10/16/21 1611 10/17/21 0548  LIPASE 186* 117*    Recent Labs  Lab 10/16/21 2046  AMMONIA 46*    Coagulation Profile: Recent Labs  Lab 10/16/21 1611  INR 1.3*    Cardiac Enzymes: No results for input(s): CKTOTAL, CKMB, CKMBINDEX, TROPONINI in the last 168 hours. BNP (last 3 results) No results for input(s): PROBNP in the last 8760 hours. HbA1C: No results for input(s): HGBA1C in the last 72 hours.  CBG: Recent Labs  Lab 10/21/21 0017 10/21/21 0655 10/21/21 1119 10/21/21 1657 10/21/21 2309  GLUCAP 93 107* 125* 84 107*    Lipid Profile: No results for input(s): CHOL, HDL, LDLCALC, TRIG, CHOLHDL, LDLDIRECT in the last 72 hours.  Thyroid Function Tests: No results for input(s): TSH, T4TOTAL, FREET4, T3FREE, THYROIDAB in the last 72 hours. Anemia Panel: No results for input(s): VITAMINB12, FOLATE, FERRITIN, TIBC, IRON, RETICCTPCT in the last 72 hours. Sepsis Labs: Recent Labs  Lab 10/16/21 1611 10/16/21 2046  LATICACIDVEN 5.1* 4.1*     Recent Results (from the past 240 hour(s))  Resp Panel by RT-PCR (Flu A&B, Covid) Nasopharyngeal Swab     Status: None   Collection Time: 10/16/21  2:54 PM   Specimen: Nasopharyngeal Swab; Nasopharyngeal(NP) swabs in vial transport medium  Result Value Ref Range Status   SARS Coronavirus 2 by RT PCR NEGATIVE NEGATIVE Final    Comment: (NOTE) SARS-CoV-2 target nucleic acids are NOT DETECTED.  The  SARS-CoV-2 RNA is generally detectable in upper respiratory specimens during the acute phase of infection. The lowest concentration of SARS-CoV-2 viral copies this assay can detect is 138 copies/mL. A negative result does not preclude SARS-Cov-2 infection and should not be used as the sole basis for treatment or other patient management decisions. A negative result may occur with  improper specimen collection/handling, submission of specimen other than nasopharyngeal swab, presence of viral mutation(s) within the areas targeted by this assay, and inadequate number of viral copies(<138 copies/mL). A negative result must be combined with clinical observations, patient history, and epidemiological information. The expected result is Negative.  Fact Sheet for Patients:  EntrepreneurPulse.com.au  Fact Sheet for Healthcare Providers:  IncredibleEmployment.be  This test is no t yet approved or cleared by the [place] FDA and  has been authorized for detection and/or diagnosis of SARS-CoV-2 by FDA under an Emergency Use Authorization (EUA). This EUA will remain  in effect (meaning this test can be used) for the duration of the COVID-19 declaration under Section 564(b)(1) of the Act, 21 U.S.C.section 360bbb-3(b)(1), unless the authorization is terminated  or revoked sooner.       Influenza A by PCR NEGATIVE NEGATIVE Final   Influenza B by PCR NEGATIVE NEGATIVE Final    Comment: (NOTE) The Xpert Xpress SARS-CoV-2/FLU/RSV plus assay is intended as an aid in the diagnosis of influenza from Nasopharyngeal swab specimens and should not be used as a sole basis for treatment. Nasal washings and aspirates are unacceptable for Xpert Xpress SARS-CoV-2/FLU/RSV testing.  Fact Sheet for Patients: EntrepreneurPulse.com.au  Fact Sheet for Healthcare Providers: IncredibleEmployment.be  This test is not yet approved or  cleared by the [place] FDA and has been authorized for detection and/or diagnosis of SARS-CoV-2 by FDA under an Emergency Use Authorization (EUA). This EUA will remain in effect (meaning this test can be used) for the duration of the COVID-19 declaration under Section 564(b)(1) of the Act, 21 U.S.C. section 360bbb-3(b)(1), unless the authorization is terminated or revoked.  Performed at [facility], [place] [address], [place], [place] [address]   Blood Culture (routine x 2)     Status: None   Collection Time: 10/16/21  4:14 PM   Specimen: BLOOD  Result Value Ref Range Status   Specimen Description BLOOD RIGHT ANTECUBITAL  Final   Special Requests   Final    BOTTLES DRAWN AEROBIC ONLY Blood Culture adequate volume   Culture   Final    NO GROWTH 5 DAYS Performed at [facility], [place] [address] [place], [place] [address]    Report Status 10/21/2021 FINAL  Final  Urine Culture     Status: None   Collection Time: 10/17/21  3:20 AM   Specimen: In/Out Cath Urine  Result Value Ref Range Status   Specimen Description IN/OUT CATH URINE  Final   Special Requests NONE  Final   Culture   Final    NO GROWTH Performed at [facility], [place] [address] [place], [place] [address]    Report Status 10/18/2021 FINAL  Final  MRSA Next Gen by PCR, Nasal     Status: None   Collection Time: 10/17/21  9:01 AM   Specimen: Nasal Mucosa; Nasal Swab  Result Value Ref Range Status   MRSA by PCR Next Gen NOT DETECTED NOT DETECTED Final    Comment: (NOTE) The GeneXpert MRSA Assay (FDA approved for NASAL specimens only), is one component of a comprehensive MRSA colonization surveillance program. It is not intended to diagnose MRSA infection nor to guide or monitor treatment for MRSA infections. Test performance is not FDA approved in patients less than 53 years old. Performed at [facility], [place] [address], [place], [place] [address]      Radiology Studies: No results  found.   Scheduled Meds:  chlorhexidine gluconate (MEDLINE KIT)  15 mL Mouth Rinse BID   Chlorhexidine Gluconate Cloth  6 each Topical Daily   insulin aspart  0-9 Units Subcutaneous TID WC   lacosamide  200 mg Oral BID   lamoTRIgine  225 mg Oral BID   levETIRAcetam  750 mg Oral  BID   mouth rinse  15 mL Mouth Rinse 10 times per day   pantoprazole sodium  40 mg Oral BID   polyethylene glycol  17 g Oral Daily     LOS: 6 days   Little Ishikawa, DO [facility] Pager: Secure chat  If 7PM-7AM, please contact night-coverage [email or web address]  10/22/2021, 7:54 AM

## 2021-10-22 NOTE — Progress Notes (Signed)
Initial Nutrition Assessment  DOCUMENTATION CODES:   Underweight, suspect severe malnutrition  INTERVENTION:   - Ensure Enlive po QID with meals and at bedtime, each supplement provides 350 kcal and 20 grams of protein  - Magic Cup TID with meals, each supplement provides 290 kcal and 9 grams of protein  - Vital Cuisine Shake TID with meals, each supplement provides 520 kcal and 22 grams of protein  - MVI with minerals daily  - Feeding assistance TID with meals  - Question pt's ability to meet nutritional needs via PO intake alone. If enteral access is pursued after GOC discussions, recommend Osmolite 1.2 @ 55 ml/hr with ProSource TF 45 ml daily to provide 1624 kcal and 84 grams of protein daily.  NUTRITION DIAGNOSIS:   Inadequate oral intake related to dysphagia as evidenced by per patient/family report.  GOAL:   Patient will meet greater than or equal to 90% of their needs  MONITOR:   PO intake, Supplement acceptance, Diet advancement, Labs, Weight trends, Skin, I & O's  REASON FOR ASSESSMENT:   Consult Assessment of nutrition requirement/status  ASSESSMENT:   35 year old male who presented to the ED on 11/30 with seizures. 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 Pt admitted with AMS, multiple seizures, AKI, hypovolemic hypernatremia, questionable acute pancreatitis.  12/02 - s/p EGD showing esophagitis and severe non-ulcerated gastritis without overt bleeding, s/p flexible sigmoidoscopy without overt findings 12/05 - full liquid diet  Discussed pt with RN. RN reports pt able to eat and drink without issue using the tactics that pt's father demonstrated to RN. Please seen RN note from 12/03 for further details. RN reports pt consumed 100% of grits from breakfast meal tray this  morning and consumed at least 2 cups of water as well as 50% of an Ensure supplement as well. Meal completion for breakfast this morning documented as 75% which equates to 352 kcal and 9 grams of protein. RD will order multiple different types of oral nutrition supplements in an attempt to maximize PO intake.  Unable to obtain diet and weight history at this time. Unable to reach pt's family via phone call. Per review of notes, pt with poor oral intake for 3 days PTA. It is also documented that pt's mother reported pt spitting up food and water during this time. Notes also indicate pt's father reported pt having a 15 lb weight loss over the last year.  Reviewed weight history in chart which is limited. Last available weight PTA is from November 2021. Overall, pt with a 4.4 kg weight loss since 07/07/20. This is a 9% weight loss in 16 months which is not clinically significant for timeframe but is concerning given reports that weight loss may have occurred more acutely.  Suspect pt with malnutrition given severe subcutaneous fat depletions but unable to confirm without a more detailed diet and/or weight history. RD unable to use muscle depletions to diagnose malnutrition given quadriplegia.  Discussed pt with MD who is recommending a Palliative Care consult to discuss goals of care. MD not offering PEG tube at this time due to high risk for removal/injury. Per MD, also not offering NG tube/Cortrak at this time unless PEG tube is offered. RD will follow for outcome of GOC discussions. Will leave tube feeding recommendations.  Medications reviewed and include: SSI, lamictal, protonix, miralax, IV KCl 10 mEq x 4 runs  Labs reviewed: potassium  3.0, creatinine 0.54, hemoglobin 8.9 CBG's: 84-132 x 24 hours  UOP: 1250 ml x 24 hours I/O's: -614 ml since admit  NUTRITION - FOCUSED PHYSICAL EXAM:  Flowsheet Row Most Recent Value  Orbital Region Moderate depletion  Upper Arm Region Severe depletion   Thoracic and Lumbar Region Severe depletion  Buccal Region Moderate depletion  Temple Region Severe depletion  Clavicle Bone Region Severe depletion  Clavicle and Acromion Bone Region Severe depletion  Scapular Bone Region Severe depletion  Dorsal Hand Severe depletion  Patellar Region Severe depletion  Anterior Thigh Region Severe depletion  Posterior Calf Region Severe depletion  Edema (RD Assessment) None  Hair Reviewed  Eyes Reviewed  Mouth Reviewed  Skin Reviewed  Nails Reviewed       Diet Order:   Diet Order             Diet full liquid Room service appropriate? Yes; Fluid consistency: Thin  Diet effective now                   EDUCATION NEEDS:   Not appropriate for education at this time  Skin:  Skin Assessment: Skin Integrity Issues: Other: skin tears to bilateral knees, penis, and L elbow  Last BM:  10/18/21  Height:   Ht Readings from Last 1 Encounters:  07/07/20 5\' 6"  (1.676 m)    Weight:   Wt Readings from Last 1 Encounters:  10/22/21 44.6 kg    BMI:  Body mass index is 15.87 kg/m.  Estimated Nutritional Needs:   Kcal:  1550-1750  Protein:  75-90 grams  Fluid:  1.5-1.7 L    14/06/22, MS, RD, LDN Inpatient Clinical Dietitian Please see AMiON for contact information.

## 2021-10-22 NOTE — Progress Notes (Signed)
SLP Note  Patient Details Name: NIKKO GOLDWIRE MRN: [id number] DOB: 1986/07/17   Called and Left a VM on [employer] mobile with my personal number. I will be available in the hospital today until 11:30 and tomorrow between 8 and 2pm. Also left VM yesterday. Home number did not connect.  Harlon Ditty, MA CCC-SLP  [facility] Mobile [phone] Office [phone]   Claudine Mouton 10/22/2021, 9:34 AM

## 2021-10-23 LAB — GLUCOSE, CAPILLARY
Glucose-Capillary: 95 mg/dL (ref 70–99)
Glucose-Capillary: 124 mg/dL — ABNORMAL HIGH (ref 70–99)
Glucose-Capillary: 97 mg/dL (ref 70–99)
Glucose-Capillary: 102 mg/dL — ABNORMAL HIGH (ref 70–99)
Glucose-Capillary: 112 mg/dL — ABNORMAL HIGH (ref 70–99)

## 2021-10-23 MED ORDER — POTASSIUM CHLORIDE 10 MEQ/100ML IV SOLN
10.0000 meq | INTRAVENOUS | Status: AC
  Administered 2021-10-23 (×4): 10 meq via INTRAVENOUS
  Filled 2021-10-23 (×4): qty 100

## 2021-10-23 NOTE — Progress Notes (Addendum)
PROGRESS NOTE    Carlos [name]  OAC:[id number] DOB: May 29, 1986 DOA: 10/16/2021 PCP: Pcp, No   Brief Narrative:  Carlos [name] is a 35 y.o. 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  Patient apparently has been having breakthrough seizures per discussion at admission per mother who was bring admitted to the hospital herself, concern the patient had been missing home medications due to her illness at home while patient's father was out of town.   Assessment & Plan:   Acute on chronic failure to thrive,  Oropharyngeal dysphagia Severe protein calorie malnutrition -Situation complicated by mother's current hospitalization to ICU, patient's father difficult to reach  -My partner Dr. Avon Gully attempted multiple times this past week to communicate with patient's father, to discuss swallowing, goals of care, PEG tube etc.  -SLP following, per their assessment -patient is at high risk of aspiration -Currently tolerating thin liquids -Will request palliative care eval for goals of care, discuss CODE STATUS, PEG tube etc.  Goals of care -Patient is bed bound, nonverbal, total assist, poorly interactive, high risk for aspiration but a poor candidate for PEG/similar tube placement given mental status and high risk for self-injury and infection -Was able to reach patient's father after multiple attempts, recommended palliative care eval for goals of care, patient's mother and primary caregiver is currently hospitalized in 6M  Acute anemia likely secondary to blood loss, rule out GI bleed, stabilizing - GI following - upper endoscopy remarkable for esophagitis, severe non-ulcerated gastritis without overt bleeding. Flex sig without overt findings. Continue PPI. - hb stabilized -Check anemia panel  Acute metabolic encephalopathy on known baseline cerebral palsy, POA, resolving -Likely  in the setting of acute breakthrough seizure as below - back to baseline per father   Acute breakthrough seizure, concern for medication noncompliance/mis-administration -Continue Keppra and Vimpat -Seen by neurology earlier this admission -Stable from this standpoint   Hypovolemic hypernatremia, severe Hemoconcentration in the setting of profound dehydration and poor p.o. intake Concurrent lactic acidosis Hypokalemia, ongoing -resolved -replace K   AKI, Resolved -stop IVF  Urinary retention -Foley placed on admission 11/30 in the ED -Attempt to remove Foley this week   Questionable acute pancreatitis -Based on imaging, physical exam somewhat limited given patient's baseline mental status with acute metabolic encephalopathy overlying  -Imaging unremarkable for acute findings to explain pancreatitis with normal-appearing gallbladder and common bile duct within normal limits  -Lipase 185 on admission, repeat was 117 -He is tolerating diet, no vomiting, no epigastric tenderness   Reported history of diabetes, well controlled A1c 6.1 Continue sliding scale close monitoring while n.p.o., hypoglycemic protocol ongoing    DVT prophylaxis: Lovenox Code Status: Full code Family Communication: No family at bedside, was able to reach patient's father after multiple attempts  Status is: Inpatient  Dispo: The patient is from: Home              Anticipated d/c is to: To be determined              Anticipated d/c date is: To be determined              Patient currently not medically stable for discharge  Consultants:  Neurology, GI  Procedures:  Upper endoscopy  Antimicrobials:  Discontinued 10/17/2021  Subjective: -Per RN, no events overnight, reportedly tolerated p.o. intake when fed  Objective: Vitals:   10/23/21 0312 10/23/21 0500 10/23/21 0712 10/23/21 1116  BP: 105/61  93/66 123/62  Pulse: 100  (!) 101 (!) 110  Resp: 18  10 17   Temp: 98.7 F (37.1 C)  98.4 F (36.9  C) 98.2 F (36.8 C)  TempSrc: Axillary  Oral Oral  SpO2: 100%  100% 100%  Weight:  44.2 kg      Intake/Output Summary (Last 24 hours) at 10/23/2021 1233 Last data filed at 10/23/2021 0943 Gross per 24 hour  Intake 1078 ml  Output 600 ml  Net 478 ml   Filed Weights   10/21/21 1627 10/22/21 0416 10/23/21 0500  Weight: 44.4 kg 44.6 kg 44.2 kg    Examination:  General: Cachectic male sitting up in bed, awake alert, does not interact or respond, smiling HEENT: Positive pallor, no icterus CVS: S1-S2, regular rate rhythm Lungs: Clear bilaterally Abdomen: Soft, nontender, bowel sounds present Extremities: No edema, contracted, bent both lower extremities Skin: Multiple blisters, skin breakdown over wrist, ankles, knees   Data Reviewed: I have personally reviewed following labs and imaging studies  CBC: Recent Labs  Lab 10/16/21 1456 10/16/21 1504 10/18/21 0414 10/18/21 1025 10/19/21 0511 10/19/21 1042 10/20/21 0605 10/20/21 1023 10/20/21 1841 10/21/21 0426 10/21/21 1126 10/22/21 0246  WBC 12.8*   < > 7.6  --  8.2  --  7.8  --   --  8.7  --  8.9  NEUTROABS 10.3*  --   --   --   --   --   --   --   --   --   --   --   HGB 13.5   < > 10.4*   < > 9.0*   < > 9.8* 9.0* 8.7* 8.7* 8.7* 8.9*  HCT 48.1   < > 34.5*   < > 29.4*   < > 30.5* 28.4* 27.4* 27.2* 28.3* 28.7*  MCV 87.8   < > 81.9  --  79.2*  --  77.6*  --   --  77.5*  --  78.8*  PLT 100*   < > 95*  --  104*  --  142*  --   --  186  --  235   < > = values in this interval not displayed.   Basic Metabolic Panel: Recent Labs  Lab 10/18/21 0414 10/19/21 0511 10/20/21 0605 10/21/21 0426 10/22/21 0246  NA 152* 145 142 142 144  K 3.4* 3.2* 2.8* 3.0* 3.0*  CL 124* 114* 110 111 110  CO2 23 23 23 25 25   GLUCOSE 85 90 133* 110* 94  BUN 30* 14 10 7 7   CREATININE 0.83 0.62 0.58* 0.50* 0.54*  CALCIUM 7.7* 7.9* 8.2* 8.0* 8.2*   GFR: CrCl cannot be calculated (Unknown ideal weight.). Liver Function Tests: Recent Labs   Lab 10/16/21 1456 10/17/21 0548  AST 36 29  ALT 13 10  ALKPHOS 44 40  BILITOT 0.6 0.5  PROT 7.3 5.5*  ALBUMIN 3.0* 2.2*   Recent Labs  Lab 10/16/21 1611 10/17/21 0548  LIPASE 186* 117*   Recent Labs  Lab 10/16/21 2046  AMMONIA 46*   Coagulation Profile: Recent Labs  Lab 10/16/21 1611  INR 1.3*   Cardiac Enzymes: No results for input(s): CKTOTAL, CKMB, CKMBINDEX, TROPONINI in the last 168 hours. BNP (last 3 results) No results for input(s): PROBNP in the last 8760 hours. HbA1C: No results for input(s): HGBA1C in the last 72 hours.  CBG: Recent Labs  Lab 10/22/21 1111 10/22/21 1540 10/22/21 2332 10/23/21 0745 10/23/21 1114  GLUCAP 132*  83 119* 95 124*   Lipid Profile: No results for input(s): CHOL, HDL, LDLCALC, TRIG, CHOLHDL, LDLDIRECT in the last 72 hours.  Thyroid Function Tests: No results for input(s): TSH, T4TOTAL, FREET4, T3FREE, THYROIDAB in the last 72 hours. Anemia Panel: No results for input(s): VITAMINB12, FOLATE, FERRITIN, TIBC, IRON, RETICCTPCT in the last 72 hours. Sepsis Labs: Recent Labs  Lab 10/16/21 1611 10/16/21 2046  LATICACIDVEN 5.1* 4.1*    Recent Results (from the past 240 hour(s))  Resp Panel by RT-PCR (Flu A&B, Covid) Nasopharyngeal Swab     Status: None   Collection Time: 10/16/21  2:54 PM   Specimen: Nasopharyngeal Swab; Nasopharyngeal(NP) swabs in vial transport medium  Result Value Ref Range Status   SARS Coronavirus 2 by RT PCR NEGATIVE NEGATIVE Final    Comment: (NOTE) SARS-CoV-2 target nucleic acids are NOT DETECTED.  The SARS-CoV-2 RNA is generally detectable in upper respiratory specimens during the acute phase of infection. The lowest concentration of SARS-CoV-2 viral copies this assay can detect is 138 copies/mL. A negative result does not preclude SARS-Cov-2 infection and should not be used as the sole basis for treatment or other patient management decisions. A negative result may occur with  improper  specimen collection/handling, submission of specimen other than nasopharyngeal swab, presence of viral mutation(s) within the areas targeted by this assay, and inadequate number of viral copies(<138 copies/mL). A negative result must be combined with clinical observations, patient history, and epidemiological information. The expected result is Negative.  Fact Sheet for Patients:  EntrepreneurPulse.com.au  Fact Sheet for Healthcare Providers:  IncredibleEmployment.be  This test is no t yet approved or cleared by the [place] FDA and  has been authorized for detection and/or diagnosis of SARS-CoV-2 by FDA under an Emergency Use Authorization (EUA). This EUA will remain  in effect (meaning this test can be used) for the duration of the COVID-19 declaration under Section 564(b)(1) of the Act, 21 U.S.C.section 360bbb-3(b)(1), unless the authorization is terminated  or revoked sooner.       Influenza A by PCR NEGATIVE NEGATIVE Final   Influenza B by PCR NEGATIVE NEGATIVE Final    Comment: (NOTE) The Xpert Xpress SARS-CoV-2/FLU/RSV plus assay is intended as an aid in the diagnosis of influenza from Nasopharyngeal swab specimens and should not be used as a sole basis for treatment. Nasal washings and aspirates are unacceptable for Xpert Xpress SARS-CoV-2/FLU/RSV testing.  Fact Sheet for Patients: EntrepreneurPulse.com.au  Fact Sheet for Healthcare Providers: IncredibleEmployment.be  This test is not yet approved or cleared by the [place] FDA and has been authorized for detection and/or diagnosis of SARS-CoV-2 by FDA under an Emergency Use Authorization (EUA). This EUA will remain in effect (meaning this test can be used) for the duration of the COVID-19 declaration under Section 564(b)(1) of the Act, 21 U.S.C. section 360bbb-3(b)(1), unless the authorization is terminated or revoked.  Performed at  [facility], [place] [address] [place], [place] [address]   Blood Culture (routine x 2)     Status: None   Collection Time: 10/16/21  4:14 PM   Specimen: BLOOD  Result Value Ref Range Status   Specimen Description BLOOD RIGHT ANTECUBITAL  Final   Special Requests   Final    BOTTLES DRAWN AEROBIC ONLY Blood Culture adequate volume   Culture   Final    NO GROWTH 5 DAYS Performed at [facility], [place] [address] [place], [place] [address]    Report Status 10/21/2021 FINAL  Final  Urine Culture     Status: None   Collection Time: 10/17/21  3:20 AM   Specimen: In/Out Cath Urine  Result Value Ref Range Status   Specimen Description IN/OUT CATH URINE  Final   Special Requests NONE  Final   Culture   Final    NO GROWTH Performed at [facility], [place] [address] [place], [place] [address]    Report Status 10/18/2021 FINAL  Final  MRSA Next Gen by PCR, Nasal     Status: None   Collection Time: 10/17/21  9:01 AM   Specimen: Nasal Mucosa; Nasal Swab  Result Value Ref Range Status   MRSA by PCR Next Gen NOT DETECTED NOT DETECTED Final    Comment: (NOTE) The GeneXpert MRSA Assay (FDA approved for NASAL specimens only), is one component of a comprehensive MRSA colonization surveillance program. It is not intended to diagnose MRSA infection nor to guide or monitor treatment for MRSA infections. Test performance is not FDA approved in patients less than 32 years old. Performed at [facility], [place] [address] [place], [place] [address]      Radiology Studies: No results found.   Scheduled Meds:  chlorhexidine gluconate (MEDLINE KIT)  15 mL Mouth Rinse BID   Chlorhexidine Gluconate Cloth  6 each Topical Daily   feeding supplement  237 mL Oral TID WC & HS   insulin aspart  0-9 Units Subcutaneous TID WC   lacosamide  200 mg Oral BID   lamoTRIgine  225 mg Oral BID   levETIRAcetam  750 mg Oral BID   mouth rinse  15 mL Mouth Rinse 10 times per day   multivitamin  with minerals  1 tablet Oral Daily   pantoprazole sodium  40 mg Oral BID   polyethylene glycol  17 g Oral Daily     LOS: 7 days   Domenic Polite, MD [facility]  If 7PM-7AM, please contact night-coverage [email or web address]  10/23/2021, 12:33 PM

## 2021-10-23 NOTE — Progress Notes (Signed)
Speech Language Pathology Treatment: Dysphagia  Patient Details Name: Carlos [name] MRN: [id number] DOB: August 30, 1986 Today's Date: 10/23/2021 Time: 1610-1630 SLP Time Calculation (min) (ACUTE ONLY): 20 min  Assessment / Plan / Recommendation Clinical Impression  Was contacted by Carlos [name], pt's RN, as Carlos [name]'s father was at bedside and SLP had been trying to connect with him the last several days.  Carlos [name] was able to provide insights into baseline swallowing/feeding.  He generally feeds Carlos [name] by holding him on his lap and supporting his head with his left shoulder - he uses a cup for liquids; spoon for solids, and uses gentle manual assist on the pt's lower jaw to elevate it for eating. He states that Carlos [name] does have oral spillage; at baseline he eats a chopped diet with occasional coughing but very little. He offered helpful cues to assist with feeding; stated that his son is not yet at baseline.  RN affirmed that pt ate quite well today.  Carlos [name] is hoping to avoid feeding tube placement.  Posted updated precautions sign over bed. SLP will continue to follow.    HPI HPI: Carlos [name] is a 35 y.o. 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  Pt has had poor PO intake for at least 3 days PTA .  15 lb weight loss this year.  CXR and Head CT 11/30 with no acute findings.  Pt with concerns for GIB following bloody BM morning of 12/2, cleared by GI for PO diet.      SLP Plan  Continue with current plan of care      Recommendations for follow up therapy are one component of a multi-disciplinary discharge planning process, led by the attending physician.  Recommendations may be updated based on patient status, additional functional criteria and insurance authorization.    Recommendations  Diet recommendations: Dysphagia 1 (puree);Thin liquid Liquids  provided via: Cup Medication Administration: Crushed with puree Supervision: Trained caregiver to feed patient Compensations: Slow rate;Small sips/bites Postural Changes and/or Swallow Maneuvers: Seated upright 90 degrees;Upright 30-60 min after meal                Oral Care Recommendations: Oral care before and after PO;Oral care BID Assistance recommended at discharge: Frequent or constant Supervision/Assistance SLP Visit Diagnosis: Dysphagia, oropharyngeal phase (R13.12) Plan: Continue with current plan of care       GO               Carlos Goga L. Samson Frederic, MA CCC/SLP Acute Rehabilitation Services Office number [phone] Pager [phone]  Carlos [name]  10/23/2021, 4:42 PM

## 2021-10-23 NOTE — Progress Notes (Signed)
Speech Language Pathology Treatment: Dysphagia  Patient Details Name: Carlos [name] MRN: [id number] DOB: [birth year] Today's Date: 10/23/2021 Time: 3546-5681 SLP Time Calculation (min) (ACUTE ONLY): 22 min  Assessment / Plan / Recommendation Clinical Impression  Pt seen for ongoing dysphagia management. RN administered medications on SLP arrival.  Assisted with POs, provided education regarding compensatory/feeding strategies. Today pt consumed thin liquids and puree with crushed medications.  There was anterior spillage, oral holding, oral residue.  Audible swallow noted.  With swallow there was intermittently some increase in anterior spillage/expectoration.  This appears to be swallow pressure expelling residue from oral cavity, not a projectile ejection from cough.  Pt appears to be protecting airway and has not had respiratory decline since initiating POs.  PO intake, however, remains effortful and family has not been reached to provide input.  Please provide oral care before and after POs to reduce risk of aspiration pneumonia.   Recommend continuing full liquid diet.  Pt may have purees from floor stock.  Administer medication crushed in puree, crushed with liquid as needed, or via alternate means.     HPI HPI: Carlos [name] is a 35 y.o. 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  Pt has had poor PO intake for at least 3 days PTA .  15 lb weight loss this year.  CXR and Head CT 11/30 with no acute findings.  Pt with concerns for GIB following bloody BM morning of 12/2, cleared by GI for PO diet.      SLP Plan  Continue with current plan of care      Recommendations for follow up therapy are one component of a multi-disciplinary discharge planning process, led by the attending physician.  Recommendations may be updated based on patient status,  additional functional criteria and insurance authorization.    Recommendations  Diet recommendations: Dysphagia 1 (puree);Thin liquid  Liquids provided via: Cup  Medication Administration: Crushed with puree  Supervision: Trained caregiver to feed patient  Compensations:  Slow rate; Small sips/bites; Follow solids with liquid; Bring head to neutral, midline position;  Place empty spoon in oral cavity to renew oral response to clear residue;  Have suction available;  Oral care following PO intake  Postural Changes and/or Swallow Maneuvers: Seated upright 90 degrees;Upright 30-60 min after meal                Oral Care Recommendations: Oral care before and after PO;Oral care BID Follow Up Recommendations: Skilled nursing-short term rehab (<3 hours/day) Assistance recommended at discharge: Frequent or constant Supervision/Assistance SLP Visit Diagnosis: Dysphagia, oropharyngeal phase (R13.12) Plan: Continue with current plan of care       GO                Kerrie Pleasure, MA, CCC-SLP Acute Rehabilitation Services Office: [phone]   10/23/2021, 10:49 AM

## 2021-10-24 DIAGNOSIS — N179 Acute kidney failure, unspecified: Secondary | ICD-10-CM

## 2021-10-24 DIAGNOSIS — K859 Acute pancreatitis without necrosis or infection, unspecified: Secondary | ICD-10-CM

## 2021-10-24 LAB — IRON AND TIBC
Iron: 69 ug/dL (ref 45–182)
Saturation Ratios: 39 % (ref 17.9–39.5)
TIBC: 178 ug/dL — ABNORMAL LOW (ref 250–450)
UIBC: 109 ug/dL

## 2021-10-24 LAB — GLUCOSE, CAPILLARY
Glucose-Capillary: 94 mg/dL (ref 70–99)
Glucose-Capillary: 119 mg/dL — ABNORMAL HIGH (ref 70–99)
Glucose-Capillary: 112 mg/dL — ABNORMAL HIGH (ref 70–99)
Glucose-Capillary: 120 mg/dL — ABNORMAL HIGH (ref 70–99)

## 2021-10-24 LAB — FERRITIN: Ferritin: 286 ng/mL (ref 24–336)

## 2021-10-24 LAB — BASIC METABOLIC PANEL
Anion gap: 3 — ABNORMAL LOW (ref 5–15)
BUN: 7 mg/dL (ref 6–20)
CO2: 29 mmol/L (ref 22–32)
Calcium: 8.2 mg/dL — ABNORMAL LOW (ref 8.9–10.3)
Chloride: 107 mmol/L (ref 98–111)
Creatinine, Ser: 0.49 mg/dL — ABNORMAL LOW (ref 0.61–1.24)
GFR, Estimated: >60 mL/min (ref >60)
Glucose, Bld: 96 mg/dL (ref 70–99)
Potassium: 3.8 mmol/L (ref 3.5–5.1)
Sodium: 139 mmol/L (ref 135–145)

## 2021-10-24 LAB — CBC
HCT: 26.4 % — ABNORMAL LOW (ref 39.0–52.0)
Hemoglobin: 8.3 g/dL — ABNORMAL LOW (ref 13.0–17.0)
MCH: 25.1 pg — ABNORMAL LOW (ref 26.0–34.0)
MCHC: 31.4 g/dL (ref 30.0–36.0)
MCV: 79.8 fL — ABNORMAL LOW (ref 80.0–100.0)
Platelets: 244 10*3/uL (ref 150–400)
RBC: 3.31 MIL/uL — ABNORMAL LOW (ref 4.22–5.81)
RDW: 14.1 % (ref 11.5–15.5)
WBC: 8 10*3/uL (ref 4.0–10.5)
nRBC: 0 % (ref 0.0–0.2)

## 2021-10-24 LAB — RETICULOCYTES
Immature Retic Fract: 31.9 % — ABNORMAL HIGH (ref 2.3–15.9)
RBC.: 3.3 MIL/uL — ABNORMAL LOW (ref 4.22–5.81)
Retic Count, Absolute: 73.3 10*3/uL (ref 19.0–186.0)
Retic Ct Pct: 2.2 % (ref 0.4–3.1)

## 2021-10-24 LAB — FOLATE: Folate: 15.2 ng/mL (ref >5.9)

## 2021-10-24 LAB — VITAMIN B12: Vitamin B-12: 1081 pg/mL — ABNORMAL HIGH (ref 180–914)

## 2021-10-24 NOTE — Progress Notes (Signed)
Nutrition Follow-up  DOCUMENTATION CODES:   Underweight, suspect severe malnutrition  INTERVENTION:   - Continue Ensure Enlive po QID with meals and at bedtime, each supplement provides 350 kcal and 20 grams of protein   - Continue Magic Cup TID with meals, each supplement provides 290 kcal and 9 grams of protein   - Continue Vital Cuisine Shake TID with meals, each supplement provides 520 kcal and 22 grams of protein   - Continue MVI with minerals daily   - Continue feeding assistance TID with meals  NUTRITION DIAGNOSIS:   Inadequate oral intake related to dysphagia as evidenced by per patient/family report.  Progressing, meal completions have improved  GOAL:   Patient will meet greater than or equal to 90% of their needs  Progressing  MONITOR:   PO intake, Supplement acceptance, Diet advancement, Labs, Weight trends, Skin, I & O's  REASON FOR ASSESSMENT:   Consult Assessment of nutrition requirement/status  ASSESSMENT:   35-year-old male who presented to the ED on 11/30 with seizures. 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 Pt admitted with AMS, multiple seizures, AKI, hypovolemic hypernatremia, questionable acute pancreatitis.  12/02 - s/p EGD showing esophagitis and severe non-ulcerated gastritis without overt bleeding, s/p flexible sigmoidoscopy without overt findings 12/05 - full liquid diet 12/07 - diet advanced to dysphagia 1 with thin liquids  Per notes, pt's father would like to avoid a feeding tube. Diet advanced to dysphagia 1 yesterday after SLP met with pt's father. RD will continue to optimize pt's nutrition with oral nutrition supplements. Meal completions have improved greatly, averaging 75-100%.  Admit weight: 44.4 kg Current weight: 44.7 kg  Meal  Completion: 12/06: 75% for breakfast, 100% for lunch, no documentation for dinner 12/07: 90% for breakfast, 95% for lunch, no documentation for dinner 12/08: 90% for breakfast  Medications reviewed and include: Ensure Enlive QID, SSI, lamictal, MVI with minerals, protonix, miralax  Labs reviewed: hemoglobin 8.3 CBG's: 94-124 x 24 hours  Diet Order:   Diet Order             DIET - DYS 1 Room service appropriate? No; Fluid consistency: Thin  Diet effective now                   EDUCATION NEEDS:   Not appropriate for education at this time  Skin:  Skin Assessment: Skin Integrity Issues: Other: skin tears to bilateral knees, penis, L elbow, and perineum  Last BM:  10/24/21 large type 6  Height:   Ht Readings from Last 1 Encounters:  07/07/20 5' 6" (1.676 m)    Weight:   Wt Readings from Last 1 Encounters:  10/24/21 44.7 kg    BMI:  Body mass index is 15.91 kg/m.  Estimated Nutritional Needs:   Kcal:  1550-1750  Protein:  75-90 grams  Fluid:  1.5-1.7 L    Kate , MS, RD, LDN Inpatient Clinical Dietitian Please see AMiON for contact information.  

## 2021-10-24 NOTE — Progress Notes (Addendum)
PROGRESS NOTE    Carlos [name]  CXK:[id number] DOB: [birth year] DOA: 10/16/2021 PCP: Pcp, No   Brief Narrative:  Carlos [name] is a 35 y.o. 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  Patient apparently has been having breakthrough seizures per discussion at admission per mother who was bring admitted to the hospital herself, concern the patient had been missing home medications due to her illness at home while patient's father was out of town.   Assessment & Plan:   Acute on chronic failure to thrive,  Oropharyngeal dysphagia Severe protein calorie malnutrition -Situation complicated by mother's current hospitalization to ICU, patient's father has been difficult to reach to discuss swallowing, goals of care, PEG tube etc.  -SLP following, per their assessment -patient is at high risk of aspiration -Currently tolerating thin liquids without significant issues -will d/w SLP to determine severity of issue considering pts ability to tolerate current diet, urgency of PEG -also requested palliative care eval for goals of care, discuss CODE STATUS, PEG tube etc.  Goals of care -Patient is bed bound, nonverbal, total assist, poorly interactive, high risk for aspiration but a poor candidate for PEG/similar tube placement given mental status and high risk for self-injury and infection -Was able to reach patient's father after multiple attempts yesterday, recommended palliative care eval for goals of care, patient's mother and primary caregiver is currently hospitalized in 70M  Acute anemia likely secondary to blood loss, stabilizing -Seen by GI in consultation, had heme positive stools initially- upper endoscopy remarkable for esophagitis, severe non-ulcerated gastritis without overt bleeding. Flex sig without overt findings - Continue PPI. - hb stabilized - anemia panel consistent  with chronic disease  Acute metabolic encephalopathy on known baseline cerebral palsy, POA, resolving -Likely in the setting of acute breakthrough seizure as below - almost back to baseline per father   Acute breakthrough seizure, concern for medication noncompliance/mis-administration -Continue Keppra, lamictal and Vimpat -Seen by neurology earlier this admission -Stable now from this standpoint   Hypovolemic hypernatremia, severe Hypokalemia, ongoing -resolved -replaced K   AKI, Resolved -stop IVF  Urinary retention -Foley placed on admission 11/30 in the ED -removed 12/7   Questionable acute pancreatitis -Based on imaging, physical exam somewhat limited given patient's baseline mental status with acute metabolic encephalopathy overlying  -Imaging unremarkable for acute findings to explain pancreatitis with normal-appearing gallbladder and common bile duct within normal limits  -Lipase 185 on admission, repeat was 117 -He is tolerating diet, no vomiting, no epigastric tenderness   Reported history of diabetes, well controlled A1c 6.1 Continue sliding scale     DVT prophylaxis: Lovenox Code Status: Full code Family Communication: No family at bedside, was able to reach patient's father after multiple attempts 12/7  Status is: Inpatient  Dispo: The patient is from: Home              Anticipated d/c is to: To be determined, likely home              Anticipated d/c date is: To be determined              Patient currently not medically stable for discharge  Consultants:  Neurology, GI  Procedures:  Upper endoscopy  Antimicrobials:  Discontinued 10/17/2021  Subjective: -No events overnight, tolerating liquids  Objective: Vitals:   10/24/21 1111 10/24/21 1217 10/24/21 1225 10/24/21 1247  BP: 122/76 (!) 89/44 (!) 90/46 103/64  Pulse: Marland Kitchen)  101   (!) 120  Resp: 20 16  18   Temp: 98.3 F (36.8 C)     TempSrc: Oral     SpO2: 95%   100%  Weight:         Intake/Output Summary (Last 24 hours) at 10/24/2021 1404 Last data filed at 10/24/2021 0919 Gross per 24 hour  Intake 1091.05 ml  Output 500 ml  Net 591.05 ml   Filed Weights   10/22/21 0416 10/23/21 0500 10/24/21 0438  Weight: 44.6 kg 44.2 kg 44.7 kg    Examination:  General: Cachectic male sitting up in bed, awake alert, does not interact or follow commands, nonverbal  HEENT: Positive pallor, no icterus CVS: S1-S2, regular rate rhythm Lungs: Clear bilaterally Abdomen: Soft, nontender, bowel sounds present Extremities: No edema, contracted bilateral lower extremities  Skin: Multiple blisters, skin breakdown over wrist, ankles, knees   Data Reviewed: I have personally reviewed following labs and imaging studies  CBC: Recent Labs  Lab 10/19/21 0511 10/19/21 1042 10/20/21 0605 10/20/21 1023 10/20/21 1841 10/21/21 0426 10/21/21 1126 10/22/21 0246 10/24/21 0548  WBC 8.2  --  7.8  --   --  8.7  --  8.9 8.0  HGB 9.0*   < > 9.8*   < > 8.7* 8.7* 8.7* 8.9* 8.3*  HCT 29.4*   < > 30.5*   < > 27.4* 27.2* 28.3* 28.7* 26.4*  MCV 79.2*  --  77.6*  --   --  77.5*  --  78.8* 79.8*  PLT 104*  --  142*  --   --  186  --  235 244   < > = values in this interval not displayed.   Basic Metabolic Panel: Recent Labs  Lab 10/19/21 0511 10/20/21 0605 10/21/21 0426 10/22/21 0246 10/24/21 0548  NA 145 142 142 144 139  K 3.2* 2.8* 3.0* 3.0* 3.8  CL 114* 110 111 110 107  CO2 23 23 25 25 29   GLUCOSE 90 133* 110* 94 96  BUN 14 10 7 7 7   CREATININE 0.62 0.58* 0.50* 0.54* 0.49*  CALCIUM 7.9* 8.2* 8.0* 8.2* 8.2*   GFR: CrCl cannot be calculated (Unknown ideal weight.). Liver Function Tests: No results for input(s): AST, ALT, ALKPHOS, BILITOT, PROT, ALBUMIN in the last 168 hours.  No results for input(s): LIPASE, AMYLASE in the last 168 hours.  No results for input(s): AMMONIA in the last 168 hours.  Coagulation Profile: No results for input(s): INR, PROTIME in the last 168  hours.  Cardiac Enzymes: No results for input(s): CKTOTAL, CKMB, CKMBINDEX, TROPONINI in the last 168 hours. BNP (last 3 results) No results for input(s): PROBNP in the last 8760 hours. HbA1C: No results for input(s): HGBA1C in the last 72 hours.  CBG: Recent Labs  Lab 10/23/21 1546 10/23/21 2012 10/23/21 2322 10/24/21 0744 10/24/21 1121  GLUCAP 97 102* 112* 94 119*   Lipid Profile: No results for input(s): CHOL, HDL, LDLCALC, TRIG, CHOLHDL, LDLDIRECT in the last 72 hours.  Thyroid Function Tests: No results for input(s): TSH, T4TOTAL, FREET4, T3FREE, THYROIDAB in the last 72 hours. Anemia Panel: Recent Labs    10/24/21 0548  VITAMINB12 1,081*  FOLATE 15.2  FERRITIN 286  TIBC 178*  IRON 69  RETICCTPCT 2.2   Sepsis Labs: No results for input(s): PROCALCITON, LATICACIDVEN in the last 168 hours.   Recent Results (from the past 240 hour(s))  Resp Panel by RT-PCR (Flu A&B, Covid) Nasopharyngeal Swab     Status: None  Collection Time: 10/16/21  2:54 PM   Specimen: Nasopharyngeal Swab; Nasopharyngeal(NP) swabs in vial transport medium  Result Value Ref Range Status   SARS Coronavirus 2 by RT PCR NEGATIVE NEGATIVE Final    Comment: (NOTE) SARS-CoV-2 target nucleic acids are NOT DETECTED.  The SARS-CoV-2 RNA is generally detectable in upper respiratory specimens during the acute phase of infection. The lowest concentration of SARS-CoV-2 viral copies this assay can detect is 138 copies/mL. A negative result does not preclude SARS-Cov-2 infection and should not be used as the sole basis for treatment or other patient management decisions. A negative result may occur with  improper specimen collection/handling, submission of specimen other than nasopharyngeal swab, presence of viral mutation(s) within the areas targeted by this assay, and inadequate number of viral copies(<138 copies/mL). A negative result must be combined with clinical observations, patient history,  and epidemiological information. The expected result is Negative.  Fact Sheet for Patients:  EntrepreneurPulse.com.au  Fact Sheet for Healthcare Providers:  IncredibleEmployment.be  This test is no t yet approved or cleared by the [place] FDA and  has been authorized for detection and/or diagnosis of SARS-CoV-2 by FDA under an Emergency Use Authorization (EUA). This EUA will remain  in effect (meaning this test can be used) for the duration of the COVID-19 declaration under Section 564(b)(1) of the Act, 21 U.S.C.section 360bbb-3(b)(1), unless the authorization is terminated  or revoked sooner.       Influenza A by PCR NEGATIVE NEGATIVE Final   Influenza B by PCR NEGATIVE NEGATIVE Final    Comment: (NOTE) The Xpert Xpress SARS-CoV-2/FLU/RSV plus assay is intended as an aid in the diagnosis of influenza from Nasopharyngeal swab specimens and should not be used as a sole basis for treatment. Nasal washings and aspirates are unacceptable for Xpert Xpress SARS-CoV-2/FLU/RSV testing.  Fact Sheet for Patients: EntrepreneurPulse.com.au  Fact Sheet for Healthcare Providers: IncredibleEmployment.be  This test is not yet approved or cleared by the [place] FDA and has been authorized for detection and/or diagnosis of SARS-CoV-2 by FDA under an Emergency Use Authorization (EUA). This EUA will remain in effect (meaning this test can be used) for the duration of the COVID-19 declaration under Section 564(b)(1) of the Act, 21 U.S.C. section 360bbb-3(b)(1), unless the authorization is terminated or revoked.  Performed at [facility], [place] [address], [place], [place] [address]   Blood Culture (routine x 2)     Status: None   Collection Time: 10/16/21  4:14 PM   Specimen: BLOOD  Result Value Ref Range Status   Specimen Description BLOOD RIGHT ANTECUBITAL  Final   Special Requests   Final     BOTTLES DRAWN AEROBIC ONLY Blood Culture adequate volume   Culture   Final    NO GROWTH 5 DAYS Performed at [facility], [place] [address], [place], [place] [address]    Report Status 10/21/2021 FINAL  Final  Urine Culture     Status: None   Collection Time: 10/17/21  3:20 AM   Specimen: In/Out Cath Urine  Result Value Ref Range Status   Specimen Description IN/OUT CATH URINE  Final   Special Requests NONE  Final   Culture   Final    NO GROWTH Performed at [facility], [place] [address], [place], [place] [address]    Report Status 10/18/2021 FINAL  Final  MRSA Next Gen by PCR, Nasal     Status: None   Collection Time: 10/17/21  9:01 AM  Specimen: Nasal Mucosa; Nasal Swab  Result Value Ref Range Status   MRSA by PCR Next Gen NOT DETECTED NOT DETECTED Final    Comment: (NOTE) The GeneXpert MRSA Assay (FDA approved for NASAL specimens only), is one component of a comprehensive MRSA colonization surveillance program. It is not intended to diagnose MRSA infection nor to guide or monitor treatment for MRSA infections. Test performance is not FDA approved in patients less than 2 years old. Performed at [facility], [place] [address], [place], [place] [address]      Radiology Studies: No results found.   Scheduled Meds:  chlorhexidine gluconate (MEDLINE KIT)  15 mL Mouth Rinse BID   Chlorhexidine Gluconate Cloth  6 each Topical Daily   feeding supplement  237 mL Oral TID WC & HS   insulin aspart  0-9 Units Subcutaneous TID WC   lacosamide  200 mg Oral BID   lamoTRIgine  225 mg Oral BID   levETIRAcetam  750 mg Oral BID   mouth rinse  15 mL Mouth Rinse 10 times per day   multivitamin with minerals  1 tablet Oral Daily   pantoprazole sodium  40 mg Oral BID   polyethylene glycol  17 g Oral Daily     LOS: 8 days   Domenic Polite, MD [facility]  If 7PM-7AM, please contact night-coverage [email or web address]  10/24/2021, 2:04 PM

## 2021-10-25 LAB — GLUCOSE, CAPILLARY
Glucose-Capillary: 89 mg/dL (ref 70–99)
Glucose-Capillary: 139 mg/dL — ABNORMAL HIGH (ref 70–99)
Glucose-Capillary: 98 mg/dL (ref 70–99)
Glucose-Capillary: 91 mg/dL (ref 70–99)

## 2021-10-25 NOTE — Progress Notes (Addendum)
PROGRESS NOTE    Carlos [name]  ULA:[id number] DOB: 1986-01-25 DOA: 10/16/2021 PCP: Pcp, No   Brief Narrative:  Carlos [name] is a 35 y.o. 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  Patient apparently has been having breakthrough seizures per discussion at admission per mother who was bring admitted to the hospital herself, concern the patient had been missing home medications due to her illness at home while patient's father was out of town.   Assessment & Plan:   Acute metabolic encephalopathy on known baseline cerebral palsy, POA, resolving -Likely in the setting of acute breakthrough seizure as below -  -Clinically improved almost close to baseline   Acute breakthrough seizure, concern for medication noncompliance/mis-administration -Likely secondary to missed doses of AEDs from caregiver illness and metabolic abnormalities  -continue Keppra, lamictal and Vimpat -Seen by neurology earlier this admission, EEG suggested severe diffuse encephalopathy without seizures or definitive epileptiform discharges -Stable now from this standpoint on current AEDs  Acute on chronic failure to thrive,  Oropharyngeal dysphagia Severe protein calorie malnutrition -Situation complicated by mother's current hospitalization to ICU, patient's father has been difficult to reach to discuss swallowing, goals of care, PEG tube etc.  -SLP following, per their assessment -patient is at high risk of aspiration -Currently tolerating thin liquids without significant issues -Discussed with SLP, need to determine severity of current dysphagia and urgency of PEG -SLP to reassess today  Goals of care -Patient is bed bound, nonverbal, total assist, poorly interactive, high risk for aspiration, also a poor candidate for PEG/similar tube placement given mental status and high risk for self-injury and  infection  Acute anemia likely secondary to blood loss, stabilizing -Seen by GI in consultation, had heme positive stools initially- upper endoscopy remarkable for esophagitis, severe non-ulcerated gastritis without overt bleeding. Flex sig without overt findings - Continue PPI. - hb stabilized - anemia panel consistent with chronic disease  Hypovolemic hypernatremia, severe Hypokalemia, ongoing -resolved -replaced K   AKI, Resolved -stop IVF  Urinary retention -Foley placed on admission 11/30 in the ED -removed 12/7   Questionable acute pancreatitis -Based on imaging, physical exam somewhat limited given patient's baseline mental status with acute metabolic encephalopathy overlying  -Imaging unremarkable for acute findings to explain pancreatitis with normal-appearing gallbladder and common bile duct within normal limits  -Lipase 185 on admission, repeat was 117 -He is tolerating diet, no vomiting, no epigastric tenderness   Reported history of diabetes, well controlled A1c 6.1 Continue sliding scale     DVT prophylaxis: Lovenox Code Status: Full code Family Communication: No family at bedside, was able to reach patient's father after multiple attempts on 12/7, was unable unable to reach father today x2 attempts  Status is: Inpatient  Dispo: The patient is from: Home              Anticipated d/c is to: To be determined, likely home              Anticipated d/c date is: To be determined              Patient currently not medically stable for discharge  Consultants:  Neurology, GI  Procedures:  Upper endoscopy  Antimicrobials:  Discontinued 10/17/2021  Subjective: -no events overnight, tolerating liquids  Objective: Vitals:   10/25/21 0307 10/25/21 0311 10/25/21 0400 10/25/21 0821  BP: 101/79   110/62  Pulse: (!) 104  97 (!) 108  Resp: _0 Temp: 98.1 F (36.7 C)   98 F (36.7 C)  TempSrc: Oral   Oral  SpO2: 99%  99% 100%  Weight:  44.3 kg       Intake/Output Summary (Last 24 hours) at 10/25/2021 1050 Last data filed at 10/25/2021 0926 Gross per 24 hour  Intake 852 ml  Output 400 ml  Net 452 ml   Filed Weights   10/23/21 0500 10/24/21 0438 10/25/21 0311  Weight: 44.2 kg 44.7 kg 44.3 kg    Examination:  General: Cachectic chronically ill male sitting up in bed, awake alert, does not interact or follow commands, nonverbal  HEENT: No icterus, no JVD CVS: S1-S2, regular rhythm, tachycardic Lungs: Clear anteriorly Abdomen: Soft, nontender, bowel sounds present Extremities: No edema, contracted bilateral lower extremities  Skin: Multiple blisters, skin breakdown over wrist, ankles, knees   Data Reviewed: I have personally reviewed following labs and imaging studies  CBC: Recent Labs  Lab 10/19/21 0511 10/19/21 1042 10/20/21 0605 10/20/21 1023 10/20/21 1841 10/21/21 0426 10/21/21 1126 10/22/21 0246 10/24/21 0548  WBC 8.2  --  7.8  --   --  8.7  --  8.9 8.0  HGB 9.0*   < > 9.8*   < > 8.7* 8.7* 8.7* 8.9* 8.3*  HCT 29.4*   < > 30.5*   < > 27.4* 27.2* 28.3* 28.7* 26.4*  MCV 79.2*  --  77.6*  --   --  77.5*  --  78.8* 79.8*  PLT 104*  --  142*  --   --  186  --  235 244   < > = values in this interval not displayed.   Basic Metabolic Panel: Recent Labs  Lab 10/19/21 0511 10/20/21 0605 10/21/21 0426 10/22/21 0246 10/24/21 0548  NA 145 142 142 144 139  K 3.2* 2.8* 3.0* 3.0* 3.8  CL 114* 110 111 110 107  CO2 _1 GLUCOSE 90 133* 110* 94 96  BUN _2 CREATININE 0.62 0.58* 0.50* 0.54* 0.49*  CALCIUM 7.9* 8.2* 8.0* 8.2* 8.2*   GFR: CrCl cannot be calculated (Unknown ideal weight.). Liver Function Tests: No results for input(s): AST, ALT, ALKPHOS, BILITOT, PROT, ALBUMIN in the last 168 hours.  No results for input(s): LIPASE, AMYLASE in the last 168 hours.  No results for input(s): AMMONIA in the last 168 hours.  Coagulation Profile: No results for input(s): INR, PROTIME in the last  168 hours.  Cardiac Enzymes: No results for input(s): CKTOTAL, CKMB, CKMBINDEX, TROPONINI in the last 168 hours. BNP (last 3 results) No results for input(s): PROBNP in the last 8760 hours. HbA1C: No results for input(s): HGBA1C in the last 72 hours.  CBG: Recent Labs  Lab 10/24/21 0744 10/24/21 1121 10/24/21 1528 10/24/21 2059 10/25/21 0821  GLUCAP 94 119* 112* 120* 89   Lipid Profile: No results for input(s): CHOL, HDL, LDLCALC, TRIG, CHOLHDL, LDLDIRECT in the last 72 hours.  Thyroid Function Tests: No results for input(s): TSH, T4TOTAL, FREET4, T3FREE, THYROIDAB in the last 72 hours. Anemia Panel: Recent Labs    10/24/21 0548  VITAMINB12 1,081*  FOLATE 15.2  FERRITIN 286  TIBC 178*  IRON 69  RETICCTPCT 2.2   Sepsis Labs: No results for input(s): PROCALCITON, LATICACIDVEN in the last 168 hours.   Recent Results (from the past 240 hour(s))  Resp Panel by RT-PCR (Flu A&B, Covid) Nasopharyngeal Swab     Status: None  Collection Time: 10/16/21  2:54 PM   Specimen: Nasopharyngeal Swab; Nasopharyngeal(NP) swabs in vial transport medium  Result Value Ref Range Status   SARS Coronavirus 2 by RT PCR NEGATIVE NEGATIVE Final    Comment: (NOTE) SARS-CoV-2 target nucleic acids are NOT DETECTED.  The SARS-CoV-2 RNA is generally detectable in upper respiratory specimens during the acute phase of infection. The lowest concentration of SARS-CoV-2 viral copies this assay can detect is 138 copies/mL. A negative result does not preclude SARS-Cov-2 infection and should not be used as the sole basis for treatment or other patient management decisions. A negative result may occur with  improper specimen collection/handling, submission of specimen other than nasopharyngeal swab, presence of viral mutation(s) within the areas targeted by this assay, and inadequate number of viral copies(<138 copies/mL). A negative result must be combined with clinical observations, patient  history, and epidemiological information. The expected result is Negative.  Fact Sheet for Patients:  EntrepreneurPulse.com.au  Fact Sheet for Healthcare Providers:  IncredibleEmployment.be  This test is no t yet approved or cleared by the [place] FDA and  has been authorized for detection and/or diagnosis of SARS-CoV-2 by FDA under an Emergency Use Authorization (EUA). This EUA will remain  in effect (meaning this test can be used) for the duration of the COVID-19 declaration under Section 564(b)(1) of the Act, 21 U.S.C.section 360bbb-3(b)(1), unless the authorization is terminated  or revoked sooner.       Influenza A by PCR NEGATIVE NEGATIVE Final   Influenza B by PCR NEGATIVE NEGATIVE Final    Comment: (NOTE) The Xpert Xpress SARS-CoV-2/FLU/RSV plus assay is intended as an aid in the diagnosis of influenza from Nasopharyngeal swab specimens and should not be used as a sole basis for treatment. Nasal washings and aspirates are unacceptable for Xpert Xpress SARS-CoV-2/FLU/RSV testing.  Fact Sheet for Patients: EntrepreneurPulse.com.au  Fact Sheet for Healthcare Providers: IncredibleEmployment.be  This test is not yet approved or cleared by the [place] FDA and has been authorized for detection and/or diagnosis of SARS-CoV-2 by FDA under an Emergency Use Authorization (EUA). This EUA will remain in effect (meaning this test can be used) for the duration of the COVID-19 declaration under Section 564(b)(1) of the Act, 21 U.S.C. section 360bbb-3(b)(1), unless the authorization is terminated or revoked.  Performed at [facility], [place] [address] [place], [place] [address]   Blood Culture (routine x 2)     Status: None   Collection Time: 10/16/21  4:14 PM   Specimen: BLOOD  Result Value Ref Range Status   Specimen Description BLOOD RIGHT ANTECUBITAL  Final   Special Requests   Final     BOTTLES DRAWN AEROBIC ONLY Blood Culture adequate volume   Culture   Final    NO GROWTH 5 DAYS Performed at [facility], [place] [address] [place], [place] [address]    Report Status 10/21/2021 FINAL  Final  Urine Culture     Status: None   Collection Time: 10/17/21  3:20 AM   Specimen: In/Out Cath Urine  Result Value Ref Range Status   Specimen Description IN/OUT CATH URINE  Final   Special Requests NONE  Final   Culture   Final    NO GROWTH Performed at [facility], [place] [address] [place], [place] [address]    Report Status 10/18/2021 FINAL  Final  MRSA Next Gen by PCR, Nasal     Status: None   Collection Time: 10/17/21  9:01 AM  Specimen: Nasal Mucosa; Nasal Swab  Result Value Ref Range Status   MRSA by PCR Next Gen NOT DETECTED NOT DETECTED Final    Comment: (NOTE) The GeneXpert MRSA Assay (FDA approved for NASAL specimens only), is one component of a comprehensive MRSA colonization surveillance program. It is not intended to diagnose MRSA infection nor to guide or monitor treatment for MRSA infections. Test performance is not FDA approved in patients less than 75 years old. Performed at [facility], [place] [address], [place], [place] [address]      Radiology Studies: No results found.   Scheduled Meds:  chlorhexidine gluconate (MEDLINE KIT)  15 mL Mouth Rinse BID   Chlorhexidine Gluconate Cloth  6 each Topical Daily   feeding supplement  237 mL Oral TID WC & HS   insulin aspart  0-9 Units Subcutaneous TID WC   lacosamide  200 mg Oral BID   lamoTRIgine  225 mg Oral BID   levETIRAcetam  750 mg Oral BID   mouth rinse  15 mL Mouth Rinse 10 times per day   multivitamin with minerals  1 tablet Oral Daily   pantoprazole sodium  40 mg Oral BID   polyethylene glycol  17 g Oral Daily     LOS: 9 days   Domenic Polite, MD [facility]  If 7PM-7AM, please contact night-coverage [email or web address]  10/25/2021, 10:50 AM

## 2021-10-25 NOTE — Progress Notes (Signed)
     Referral received for Carlos [name] for goals of care discussion. Chart reviewed and patient is unable to engage appropriately in discussions. Primary caregiver (mother) admitted to ICU and unable to make decisions. Noted difficulty in contacting father.  PMT will attempt to contact family as soon as possible. Detailed note and recommendations to follow once GOC has been completed.   Thank you for your referral and allowing PMT to assist in [name]'s care.   Wynne Dust, NP Palliative Medicine Team Phone: [phone]  NO CHARGE

## 2021-10-25 NOTE — Progress Notes (Signed)
Speech Language Pathology Treatment: Dysphagia  Patient Details Name: Carlos [name] MRN: [id number] DOB: 07-17-1986 Today's Date: 10/25/2021 Time: 1155-2080 SLP Time Calculation (min) (ACUTE ONLY): 20 min  Assessment / Plan / Recommendation Clinical Impression  Carlos [name] is alert today, eyes wide open, and responsive to feeding. He was repositioned upright with pillow positioned behind head to bring to a slightly more neutral position.  He ate a four oz container of pudding with careful, slow presentation of spoon.  There is audible, repetitive swallowing observed (5-6 sub-swallows) per each bolus; only trace oral residue noted today.  Liquid consumption was similar - no overt coughing. His respiratory status has not declined, and while his swallowing looks/sounds disordered, he seems to be managing intake without developing adverse respiratory consequences. Pt's father stated on Wednesday that he prefers to avoid tube feeding.  A PEG in this instance may offer some help - supplementing his POs to ensure his nutritional needs are met and ensuring he gets his necessary meds. D/W Dr. Broadus John.  SLP will follow.   HPI HPI: Carlos [name] is a 35 y.o. 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  Pt has had poor PO intake for at least 3 days PTA .  15 lb weight loss this year.  CXR and Head CT 11/30 with no acute findings.  Pt with concerns for GIB following bloody BM morning of 12/2, cleared by GI for PO diet.      SLP Plan  Continue with current plan of care      Recommendations for follow up therapy are one component of a multi-disciplinary discharge planning process, led by the attending physician.  Recommendations may be updated based on patient status, additional functional criteria and insurance authorization.    Recommendations  Diet recommendations:  Dysphagia 1 (puree);Thin liquid Liquids provided via: Cup Medication Administration: Crushed with puree Supervision: Trained caregiver to feed patient Compensations: Slow rate;Small sips/bites Postural Changes and/or Swallow Maneuvers: Seated upright 90 degrees;Upright 30-60 min after meal                Oral Care Recommendations: Oral care before and after PO;Oral care BID Assistance recommended at discharge: Frequent or constant Supervision/Assistance SLP Visit Diagnosis: Dysphagia, oropharyngeal phase (R13.12) Plan: Continue with current plan of care       [place]. Carlos [name], Carlos [name] CCC/SLP Acute Rehabilitation Services Office number [phone] Pager [phone]  Carlos [name]  10/25/2021, 12:41 PM

## 2021-10-26 DIAGNOSIS — Z515 Encounter for palliative care: Secondary | ICD-10-CM

## 2021-10-26 LAB — BASIC METABOLIC PANEL
Anion gap: 7 (ref 5–15)
BUN: <5 mg/dL — ABNORMAL LOW (ref 6–20)
CO2: 30 mmol/L (ref 22–32)
Calcium: 8.6 mg/dL — ABNORMAL LOW (ref 8.9–10.3)
Chloride: 102 mmol/L (ref 98–111)
Creatinine, Ser: 0.5 mg/dL — ABNORMAL LOW (ref 0.61–1.24)
GFR, Estimated: >60 mL/min (ref >60)
Glucose, Bld: 104 mg/dL — ABNORMAL HIGH (ref 70–99)
Potassium: 3.8 mmol/L (ref 3.5–5.1)
Sodium: 139 mmol/L (ref 135–145)

## 2021-10-26 LAB — GLUCOSE, CAPILLARY
Glucose-Capillary: 79 mg/dL (ref 70–99)
Glucose-Capillary: 122 mg/dL — ABNORMAL HIGH (ref 70–99)
Glucose-Capillary: 71 mg/dL (ref 70–99)
Glucose-Capillary: 121 mg/dL — ABNORMAL HIGH (ref 70–99)

## 2021-10-26 LAB — CBC
HCT: 29.2 % — ABNORMAL LOW (ref 39.0–52.0)
Hemoglobin: 9 g/dL — ABNORMAL LOW (ref 13.0–17.0)
MCH: 24.7 pg — ABNORMAL LOW (ref 26.0–34.0)
MCHC: 30.8 g/dL (ref 30.0–36.0)
MCV: 80 fL (ref 80.0–100.0)
Platelets: 293 10*3/uL (ref 150–400)
RBC: 3.65 MIL/uL — ABNORMAL LOW (ref 4.22–5.81)
RDW: 14.6 % (ref 11.5–15.5)
WBC: 8.9 10*3/uL (ref 4.0–10.5)
nRBC: 0 % (ref 0.0–0.2)

## 2021-10-26 NOTE — Progress Notes (Signed)
PROGRESS NOTE    Carlos [name]  YQM:[id number] DOB: May 01, 1986 DOA: 10/16/2021 PCP: Pcp, No   Brief Narrative:  Carlos [name] is a 35 y.o. 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  Patient apparently has been having breakthrough seizures per discussion at admission per mother who was bring admitted to the hospital herself, concern the patient had been missing home medications due to her illness at home while patient's father was out of town.   Assessment & Plan:   Acute metabolic encephalopathy on known baseline cerebral palsy, POA, resolving -in the setting of acute breakthrough seizure as below -  -Clinically improved almost close to baseline   Acute breakthrough seizure, concern for medication noncompliance/mis-administration -Likely secondary to missed doses of AEDs from caregiver illness and metabolic abnormalities  -continue Keppra, lamictal and Vimpat -Seen by neurology earlier this admission, EEG suggested severe diffuse encephalopathy without seizures or definitive epileptiform discharges -Stable now on current AEDs  Acute on chronic failure to thrive,  Oropharyngeal dysphagia Severe protein calorie malnutrition -Situation complicated by mother's current hospitalization to ICU, patient's father has been difficult to reach to discuss swallowing, goals of care, PEG tube etc.  -SLP following, per their assessment -patient is at high risk of aspiration -Currently tolerating thin liquids without significant issues -Discussed with SLP, appears to be tolerating dysphagia 1 diet and thin liquids without concerns, per SLP- PEG in this situation may be helpful to supplement p.o.'s and ensure medication compliance, risk of dysphagia progressing, discussed briefly with his father yesterday, he still seems undecided and ideally would like to avoid a PEG tube now, at least  until patient's main caregiver and mom can also participate in such decisions. -I will discuss further this weekend  Goals of care -Patient is bed bound, nonverbal, total assist, poorly interactive, high risk for aspiration, also a poor candidate for PEG/similar tube placement given mental status and high risk for self-injury and infection -See discussion above, likely deferring PEG tube for now, anticipate need for this in the future  Acute anemia likely secondary to blood loss, stabilizing -Seen by GI in consultation, had heme positive stools initially- upper endoscopy remarkable for esophagitis, severe non-ulcerated gastritis without overt bleeding. Flex sig without overt findings - Continue PPI. - hb stabilized - anemia panel consistent with chronic disease  Hypovolemic hypernatremia, severe Hypokalemia, ongoing -resolved -replaced K   AKI, Resolved -stop IVF  Urinary retention -Foley placed on admission 11/30 in the ED -removed 12/7   Questionable acute pancreatitis -Based on imaging, physical exam somewhat limited given patient's baseline mental status with acute metabolic encephalopathy overlying  -Imaging unremarkable for acute findings to explain pancreatitis with normal-appearing gallbladder and common bile duct within normal limits  -Lipase 185 on admission, repeat was 117 -He is tolerating diet, no vomiting, no epigastric tenderness   Reported history of diabetes, well controlled A1c 6.1 Continue sliding scale     DVT prophylaxis: Lovenox Code Status: Full code Family Communication: No family at bedside, was able to reach patient's father after multiple attempts on 12/7, was unable to reach father 12/8, updated him 12/9  Status is: Inpatient  Dispo: The patient is from: Home              Anticipated d/c is to: To be determined, likely home              Anticipated d/c date is: To be determined  Patient currently not medically stable for  discharge  Consultants:  Neurology, GI  Procedures:  Upper endoscopy  Antimicrobials:  Discontinued 10/17/2021  Subjective: -Patient seen, no events overnight, tolerating liquids, taking his meds  Objective: Vitals:   10/25/21 1953 10/25/21 2328 10/26/21 0319 10/26/21 0823  BP: 115/76 96/62 (!) 107/58 110/71  Pulse: (!) 111 (!) 104 (!) 110 (!) 103  Resp: _0 Temp: 98.7 F (37.1 C) 97.6 F (36.4 C) 98.3 F (36.8 C) 99.3 F (37.4 C)  TempSrc: Oral Oral Oral Axillary  SpO2: 98% 100% 96% 100%  Weight:        Intake/Output Summary (Last 24 hours) at 10/26/2021 1113 Last data filed at 10/26/2021 0400 Gross per 24 hour  Intake 380 ml  Output 1400 ml  Net -1020 ml   Filed Weights   10/23/21 0500 10/24/21 0438 10/25/21 0311  Weight: 44.2 kg 44.7 kg 44.3 kg    Examination:  General: Chronically ill male sitting up in bed, awake alert, nonverbal, does not interact or follow commands CVS: S1-S2, regular rhythm, tachycardic Lungs: Clear anteriorly Abdomen: Soft, nontender, bowel sounds present Extremities: No edema, contracted bilateral lower extremities  Skin: Multiple blisters, skin breakdown over wrist, ankles, knees   Data Reviewed: I have personally reviewed following labs and imaging studies  CBC: Recent Labs  Lab 10/20/21 0605 10/20/21 1023 10/21/21 0426 10/21/21 1126 10/22/21 0246 10/24/21 0548 10/26/21 0313  WBC 7.8  --  8.7  --  8.9 8.0 8.9  HGB 9.8*   < > 8.7* 8.7* 8.9* 8.3* 9.0*  HCT 30.5*   < > 27.2* 28.3* 28.7* 26.4* 29.2*  MCV 77.6*  --  77.5*  --  78.8* 79.8* 80.0  PLT 142*  --  186  --  235 244 293   < > = values in this interval not displayed.   Basic Metabolic Panel: Recent Labs  Lab 10/20/21 0605 10/21/21 0426 10/22/21 0246 10/24/21 0548 10/26/21 0313  NA 142 142 144 139 139  K 2.8* 3.0* 3.0* 3.8 3.8  CL 110 111 110 107 102  CO2 _1 GLUCOSE 133* 110* 94 96 104*  BUN _2 <5*  CREATININE 0.58* 0.50*  0.54* 0.49* 0.50*  CALCIUM 8.2* 8.0* 8.2* 8.2* 8.6*   GFR: CrCl cannot be calculated (Unknown ideal weight.). Liver Function Tests: No results for input(s): AST, ALT, ALKPHOS, BILITOT, PROT, ALBUMIN in the last 168 hours.  No results for input(s): LIPASE, AMYLASE in the last 168 hours.  No results for input(s): AMMONIA in the last 168 hours.  Coagulation Profile: No results for input(s): INR, PROTIME in the last 168 hours.  Cardiac Enzymes: No results for input(s): CKTOTAL, CKMB, CKMBINDEX, TROPONINI in the last 168 hours. BNP (last 3 results) No results for input(s): PROBNP in the last 8760 hours. HbA1C: No results for input(s): HGBA1C in the last 72 hours.  CBG: Recent Labs  Lab 10/25/21 0821 10/25/21 1139 10/25/21 1622 10/25/21 2105 10/26/21 0824  GLUCAP 89 139* 98 91 79   Lipid Profile: No results for input(s): CHOL, HDL, LDLCALC, TRIG, CHOLHDL, LDLDIRECT in the last 72 hours.  Thyroid Function Tests: No results for input(s): TSH, T4TOTAL, FREET4, T3FREE, THYROIDAB in the last 72 hours. Anemia Panel: Recent Labs    10/24/21 0548  VITAMINB12 1,081*  FOLATE 15.2  FERRITIN 286  TIBC 178*  IRON 69  RETICCTPCT 2.2   Sepsis Labs: No results for input(s): PROCALCITON, LATICACIDVEN in  the last 168 hours.   Recent Results (from the past 240 hour(s))  Resp Panel by RT-PCR (Flu A&B, Covid) Nasopharyngeal Swab     Status: None   Collection Time: 10/16/21  2:54 PM   Specimen: Nasopharyngeal Swab; Nasopharyngeal(NP) swabs in vial transport medium  Result Value Ref Range Status   SARS Coronavirus 2 by RT PCR NEGATIVE NEGATIVE Final    Comment: (NOTE) SARS-CoV-2 target nucleic acids are NOT DETECTED.  The SARS-CoV-2 RNA is generally detectable in upper respiratory specimens during the acute phase of infection. The lowest concentration of SARS-CoV-2 viral copies this assay can detect is 138 copies/mL. A negative result does not preclude SARS-Cov-2 infection and  should not be used as the sole basis for treatment or other patient management decisions. A negative result may occur with  improper specimen collection/handling, submission of specimen other than nasopharyngeal swab, presence of viral mutation(s) within the areas targeted by this assay, and inadequate number of viral copies(<138 copies/mL). A negative result must be combined with clinical observations, patient history, and epidemiological information. The expected result is Negative.  Fact Sheet for Patients:  EntrepreneurPulse.com.au  Fact Sheet for Healthcare Providers:  IncredibleEmployment.be  This test is no t yet approved or cleared by the [place] FDA and  has been authorized for detection and/or diagnosis of SARS-CoV-2 by FDA under an Emergency Use Authorization (EUA). This EUA will remain  in effect (meaning this test can be used) for the duration of the COVID-19 declaration under Section 564(b)(1) of the Act, 21 U.S.C.section 360bbb-3(b)(1), unless the authorization is terminated  or revoked sooner.       Influenza A by PCR NEGATIVE NEGATIVE Final   Influenza B by PCR NEGATIVE NEGATIVE Final    Comment: (NOTE) The Xpert Xpress SARS-CoV-2/FLU/RSV plus assay is intended as an aid in the diagnosis of influenza from Nasopharyngeal swab specimens and should not be used as a sole basis for treatment. Nasal washings and aspirates are unacceptable for Xpert Xpress SARS-CoV-2/FLU/RSV testing.  Fact Sheet for Patients: EntrepreneurPulse.com.au  Fact Sheet for Healthcare Providers: IncredibleEmployment.be  This test is not yet approved or cleared by the [place] FDA and has been authorized for detection and/or diagnosis of SARS-CoV-2 by FDA under an Emergency Use Authorization (EUA). This EUA will remain in effect (meaning this test can be used) for the duration of the COVID-19 declaration  under Section 564(b)(1) of the Act, 21 U.S.C. section 360bbb-3(b)(1), unless the authorization is terminated or revoked.  Performed at [facility], [place] [address] [place], [place] [address]   Blood Culture (routine x 2)     Status: None   Collection Time: 10/16/21  4:14 PM   Specimen: BLOOD  Result Value Ref Range Status   Specimen Description BLOOD RIGHT ANTECUBITAL  Final   Special Requests   Final    BOTTLES DRAWN AEROBIC ONLY Blood Culture adequate volume   Culture   Final    NO GROWTH 5 DAYS Performed at [facility], [place] [address], [place], [place] [address]    Report Status 10/21/2021 FINAL  Final  Urine Culture     Status: None   Collection Time: 10/17/21  3:20 AM   Specimen: In/Out Cath Urine  Result Value Ref Range Status   Specimen Description IN/OUT CATH URINE  Final   Special Requests NONE  Final   Culture   Final    NO GROWTH Performed at [facility], [place] [address], [place], [place]  [address]    Report Status 10/18/2021 FINAL  Final  MRSA Next Gen by PCR, Nasal     Status: None   Collection Time: 10/17/21  9:01 AM   Specimen: Nasal Mucosa; Nasal Swab  Result Value Ref Range Status   MRSA by PCR Next Gen NOT DETECTED NOT DETECTED Final    Comment: (NOTE) The GeneXpert MRSA Assay (FDA approved for NASAL specimens only), is one component of a comprehensive MRSA colonization surveillance program. It is not intended to diagnose MRSA infection nor to guide or monitor treatment for MRSA infections. Test performance is not FDA approved in patients less than 44 years old. Performed at [facility], [place] [address], [place], [place] [address]      Radiology Studies: No results found.   Scheduled Meds:  chlorhexidine gluconate (MEDLINE KIT)  15 mL Mouth Rinse BID   Chlorhexidine Gluconate Cloth  6 each Topical Daily   feeding supplement  237 mL Oral TID WC & HS   insulin aspart  0-9 Units Subcutaneous TID WC   lacosamide  200 mg  Oral BID   lamoTRIgine  225 mg Oral BID   levETIRAcetam  750 mg Oral BID   mouth rinse  15 mL Mouth Rinse 10 times per day   multivitamin with minerals  1 tablet Oral Daily   pantoprazole sodium  40 mg Oral BID   polyethylene glycol  17 g Oral Daily     LOS: 10 days   Domenic Polite, MD [facility]  If 7PM-7AM, please contact night-coverage [email or web address]  10/26/2021, 11:13 AM

## 2021-10-26 NOTE — Plan of Care (Signed)

## 2021-10-26 NOTE — Consult Note (Signed)
     Referral received for Carlos [name] for goals of care discussion. Chart reviewed and updates received from RN. Patient assessed and is unable to engage appropriately in discussions.   Patient's mother (primary caregiver) is also admitted to the hospital. Was in the ICU, now on 49M. Spoke with her hospitalist who states she is improving but still confused and does not have capacity to participate in son's healthcare decisions.  Attempted to contact patient's father. Unable to reach, voicemail is full.   PMT will re-attempt to contact family at a later time/date. Detailed note and recommendations to follow once GOC has been completed.   Thank you for your referral and allowing PMT to assist in Carlos [name]'s care.   Wynne Dust, NP Palliative Medicine Team Phone: [phone]  NO CHARGE

## 2021-10-27 LAB — BASIC METABOLIC PANEL
Anion gap: 6 (ref 5–15)
BUN: 10 mg/dL (ref 6–20)
CO2: 30 mmol/L (ref 22–32)
Calcium: 8.6 mg/dL — ABNORMAL LOW (ref 8.9–10.3)
Chloride: 105 mmol/L (ref 98–111)
Creatinine, Ser: 0.48 mg/dL — ABNORMAL LOW (ref 0.61–1.24)
GFR, Estimated: >60 mL/min (ref >60)
Glucose, Bld: 94 mg/dL (ref 70–99)
Potassium: 3.9 mmol/L (ref 3.5–5.1)
Sodium: 141 mmol/L (ref 135–145)

## 2021-10-27 LAB — CBC
HCT: 27.1 % — ABNORMAL LOW (ref 39.0–52.0)
Hemoglobin: 8.5 g/dL — ABNORMAL LOW (ref 13.0–17.0)
MCH: 24.9 pg — ABNORMAL LOW (ref 26.0–34.0)
MCHC: 31.4 g/dL (ref 30.0–36.0)
MCV: 79.2 fL — ABNORMAL LOW (ref 80.0–100.0)
Platelets: 298 10*3/uL (ref 150–400)
RBC: 3.42 MIL/uL — ABNORMAL LOW (ref 4.22–5.81)
RDW: 15 % (ref 11.5–15.5)
WBC: 7.6 10*3/uL (ref 4.0–10.5)
nRBC: 0 % (ref 0.0–0.2)

## 2021-10-27 LAB — GLUCOSE, CAPILLARY
Glucose-Capillary: 85 mg/dL (ref 70–99)
Glucose-Capillary: 86 mg/dL (ref 70–99)
Glucose-Capillary: 120 mg/dL — ABNORMAL HIGH (ref 70–99)
Glucose-Capillary: 120 mg/dL — ABNORMAL HIGH (ref 70–99)

## 2021-10-27 LAB — PROCALCITONIN: Procalcitonin: 19.4 ng/mL

## 2021-10-27 NOTE — Progress Notes (Signed)
PROGRESS NOTE    Carlos [name]  YVO:[id number] DOB: October 01, 1986 DOA: 10/16/2021 PCP: Pcp, No   Brief Narrative:  Carlos [name] is a 35 y.o. 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  Patient apparently has been having breakthrough seizures per discussion at admission per mother who was bring admitted to the hospital herself, concern the patient had been missing home medications due to her illness at home while patient's father was out of [facility] course notable for seizures initially subsequently stabilized -Also had an upper GI bleed, endoscopy noted gastritis -Situation complicated by ongoing dysphagia and limited family decision-making capacity with mother's current hospitalization   Assessment & Plan:   Acute metabolic encephalopathy on known baseline cerebral palsy, POA, resolving -in the setting of acute breakthrough seizure as below -  -Clinically improved almost close to baseline   Acute breakthrough seizure, concern for medication noncompliance/mis-administration -Likely secondary to missed doses of AEDs from caregiver illness and metabolic abnormalities  -continue Keppra, lamictal and Vimpat -Seen by neurology earlier this admission, EEG suggested severe diffuse encephalopathy without seizures or definitive epileptiform discharges -Stable now on current AEDs  Acute on chronic failure to thrive,  Oropharyngeal dysphagia Severe protein calorie malnutrition -Situation complicated by mother's current hospitalization, inability to make decisions,, patient's father has been difficult to reach to discuss swallowing, goals of care, PEG tube etc.  -SLP following, per their assessment -patient is at high risk of aspiration -Currently tolerating thin liquids without significant issues -Discussed with SLP, appears to be tolerating dysphagia 1 diet and thin liquids  without concerns, per SLP- PEG in this situation may be helpful to supplement p.o.'s and ensure medication compliance, risk of dysphagia progressing, discussed with his father 12/9, he still seems undecided and ideally would like to avoid a PEG tube now, at least until patient's main caregiver and mom can also participate in such decisions. -Attempting to contact father again, left messages  Goals of care -Patient is bed bound, nonverbal, total assist, poorly interactive, high risk for aspiration, also a poor candidate for PEG/similar tube placement given mental status and high risk for self-injury and infection -See discussion above, likely deferring PEG tube for now, anticipate need for this in the future  Acute anemia likely secondary to blood loss, stabilizing -Seen by GI in consultation, had heme positive stools initially- upper endoscopy remarkable for esophagitis, severe non-ulcerated gastritis without overt bleeding. Flex sig without overt findings - Continue PPI. - hb stabilized - anemia panel consistent with chronic disease  Hypovolemic hypernatremia, severe Hypokalemia, ongoing -resolved -replaced K   AKI, Resolved -stop IVF  Urinary retention -Foley placed on admission 11/30 in the ED -removed 12/7   Questionable acute pancreatitis -Based on imaging, physical exam somewhat limited given patient's baseline mental status with acute metabolic encephalopathy overlying  -Imaging unremarkable for acute findings to explain pancreatitis with normal-appearing gallbladder and common bile duct within normal limits  -Lipase 185 on admission, repeat was 117 -He is tolerating diet, no vomiting, no epigastric tenderness   Reported history of diabetes, well controlled A1c 6.1 Continue sliding scale    DVT prophylaxis: Lovenox Code Status: Full code Family Communication: No family at bedside, was able to reach patient's father after multiple attempts on 12/7, was unable to reach  father 12/8, updated him 12/9, left message 12/10  Status is: Inpatient  Dispo: The patient is from: Home  Anticipated d/c is to: To be determined, likely home              Anticipated d/c date is: To be determined              Patient currently not medically stable for discharge  Consultants:  Neurology, GI  Procedures:  Upper endoscopy  Antimicrobials:  Discontinued 10/17/2021  Subjective: -Heart rate improving, afebrile, T-max of 100 yesterday  Objective: Vitals:   10/26/21 1920 10/26/21 2314 10/27/21 0317 10/27/21 0500  BP: 117/81 107/76 125/75   Pulse: (!) 111 (!) 117 (!) 101   Resp: 16 20 15    Temp: 98.5 F (36.9 C) 98.1 F (36.7 C) 98.8 F (37.1 C)   TempSrc: Oral Oral Axillary   SpO2: 98% 97% 98%   Weight:    40 kg    Intake/Output Summary (Last 24 hours) at 10/27/2021 1022 Last data filed at 10/27/2021 0600 Gross per 24 hour  Intake --  Output 825 ml  Net -825 ml   Filed Weights   10/24/21 0438 10/25/21 0311 10/27/21 0500  Weight: 44.7 kg 44.3 kg 40 kg    Examination:  Gen: Chronically ill frail cachectic male sitting up in bed, awake alert, nonverbal, does not interact or follow commands, HEENT: No JVD CVS: S1-S2, regular rhythm, tachycardic Lungs: Clear bilaterally Abdomen: Soft, nontender, nondistended, bowel sounds present Extremities: No edema, contracted bilateral lower extremities Skin: Multiple blisters, skin breakdown over wrist, ankles, knees   Data Reviewed: I have personally reviewed following labs and imaging studies  CBC: Recent Labs  Lab 10/21/21 0426 10/21/21 1126 10/22/21 0246 10/24/21 0548 10/26/21 0313 10/27/21 0244  WBC 8.7  --  8.9 8.0 8.9 7.6  HGB 8.7* 8.7* 8.9* 8.3* 9.0* 8.5*  HCT 27.2* 28.3* 28.7* 26.4* 29.2* 27.1*  MCV 77.5*  --  78.8* 79.8* 80.0 79.2*  PLT 186  --  235 244 293 623   Basic Metabolic Panel: Recent Labs  Lab 10/21/21 0426 10/22/21 0246 10/24/21 0548 10/26/21 0313 10/27/21 0244   NA 142 144 139 139 141  K 3.0* 3.0* 3.8 3.8 3.9  CL 111 110 107 102 105  CO2 25 25 29 30 30   GLUCOSE 110* 94 96 104* 94  BUN 7 7 7  <5* 10  CREATININE 0.50* 0.54* 0.49* 0.50* 0.48*  CALCIUM 8.0* 8.2* 8.2* 8.6* 8.6*   GFR: CrCl cannot be calculated (Unknown ideal weight.). Liver Function Tests: No results for input(s): AST, ALT, ALKPHOS, BILITOT, PROT, ALBUMIN in the last 168 hours.  No results for input(s): LIPASE, AMYLASE in the last 168 hours.  No results for input(s): AMMONIA in the last 168 hours.  Coagulation Profile: No results for input(s): INR, PROTIME in the last 168 hours.  Cardiac Enzymes: No results for input(s): CKTOTAL, CKMB, CKMBINDEX, TROPONINI in the last 168 hours. BNP (last 3 results) No results for input(s): PROBNP in the last 8760 hours. HbA1C: No results for input(s): HGBA1C in the last 72 hours.  CBG: Recent Labs  Lab 10/26/21 0824 10/26/21 1248 10/26/21 1730 10/26/21 2119 10/27/21 0826  GLUCAP 79 122* 71 121* 85   Lipid Profile: No results for input(s): CHOL, HDL, LDLCALC, TRIG, CHOLHDL, LDLDIRECT in the last 72 hours.  Thyroid Function Tests: No results for input(s): TSH, T4TOTAL, FREET4, T3FREE, THYROIDAB in the last 72 hours. Anemia Panel: No results for input(s): VITAMINB12, FOLATE, FERRITIN, TIBC, IRON, RETICCTPCT in the last 72 hours.  Sepsis Labs: Recent Labs  Lab 10/27/21 0244  PROCALCITON 19.40  No results found for this or any previous visit (from the past 240 hour(s)).    Radiology Studies: No results found.   Scheduled Meds:  chlorhexidine gluconate (MEDLINE KIT)  15 mL Mouth Rinse BID   Chlorhexidine Gluconate Cloth  6 each Topical Daily   feeding supplement  237 mL Oral TID WC & HS   insulin aspart  0-9 Units Subcutaneous TID WC   lacosamide  200 mg Oral BID   lamoTRIgine  225 mg Oral BID   levETIRAcetam  750 mg Oral BID   mouth rinse  15 mL Mouth Rinse 10 times per day   multivitamin with minerals  1  tablet Oral Daily   pantoprazole sodium  40 mg Oral BID   polyethylene glycol  17 g Oral Daily     LOS: 11 days   Domenic Polite, MD [facility]  If 7PM-7AM, please contact night-coverage [email or web address]  10/27/2021, 10:22 AM

## 2021-10-28 LAB — GLUCOSE, CAPILLARY
Glucose-Capillary: 104 mg/dL — ABNORMAL HIGH (ref 70–99)
Glucose-Capillary: 175 mg/dL — ABNORMAL HIGH (ref 70–99)
Glucose-Capillary: 113 mg/dL — ABNORMAL HIGH (ref 70–99)
Glucose-Capillary: 105 mg/dL — ABNORMAL HIGH (ref 70–99)

## 2021-10-28 LAB — BASIC METABOLIC PANEL
Anion gap: 9 (ref 5–15)
BUN: 14 mg/dL (ref 6–20)
CO2: 28 mmol/L (ref 22–32)
Calcium: 8.8 mg/dL — ABNORMAL LOW (ref 8.9–10.3)
Chloride: 104 mmol/L (ref 98–111)
Creatinine, Ser: 0.44 mg/dL — ABNORMAL LOW (ref 0.61–1.24)
GFR, Estimated: >60 mL/min (ref >60)
Glucose, Bld: 106 mg/dL — ABNORMAL HIGH (ref 70–99)
Potassium: 3.8 mmol/L (ref 3.5–5.1)
Sodium: 141 mmol/L (ref 135–145)

## 2021-10-28 LAB — CBC
HCT: 28.5 % — ABNORMAL LOW (ref 39.0–52.0)
Hemoglobin: 9 g/dL — ABNORMAL LOW (ref 13.0–17.0)
MCH: 25.3 pg — ABNORMAL LOW (ref 26.0–34.0)
MCHC: 31.6 g/dL (ref 30.0–36.0)
MCV: 80.1 fL (ref 80.0–100.0)
Platelets: 328 10*3/uL (ref 150–400)
RBC: 3.56 MIL/uL — ABNORMAL LOW (ref 4.22–5.81)
RDW: 15.2 % (ref 11.5–15.5)
WBC: 8.6 10*3/uL (ref 4.0–10.5)
nRBC: 0 % (ref 0.0–0.2)

## 2021-10-28 MED ORDER — METOPROLOL TARTRATE 5 MG/5ML IV SOLN
5.0000 mg | Freq: Once | INTRAVENOUS | Status: AC | PRN
  Administered 2021-10-28: 5 mg via INTRAVENOUS
  Filled 2021-10-28: qty 5

## 2021-10-28 MED ORDER — ENOXAPARIN SODIUM 30 MG/0.3ML IJ SOSY
30.0000 mg | PREFILLED_SYRINGE | INTRAMUSCULAR | Status: DC
  Administered 2021-10-28 – 2021-10-30 (×3): 30 mg via SUBCUTANEOUS
  Filled 2021-10-28 (×3): qty 0.3

## 2021-10-28 NOTE — Progress Notes (Signed)
  Transition of Care [facility]) Screening Note   Patient Details  Name: Carlos [name] Date of Birth: May 16, 1986   [facility] of [facility] CM/SW Contact:    Darrold Span, RN Phone Number: 10/28/2021, 11:38 AM    Transition of Care Department [facility]) has reviewed patient and TOC needs have not been identified at this time. We will continue to monitor patient advancement through interdisciplinary progression rounds. If new patient transition needs arise, please place a TOC consult.

## 2021-10-28 NOTE — Progress Notes (Signed)
Inpatient Rehabilitation Admissions Coordinator   I met at bedside with patient's Dad as he was visiting to discuss his wife's discharge needs as she is also a patient. He states he is without electricity therefore heat and power in the last 3 days for his wife handles their funds and she is unable to do so in her current hospitalized status. I have alerted TOC, Kristi.  Danne Baxter, RN, MSN Rehab Admissions Coordinator [phone] 10/28/2021 3:19 PM

## 2021-10-28 NOTE — Progress Notes (Addendum)
PROGRESS NOTE    [name]  OZD:[id number] DOB: 1986/11/11 DOA: 10/16/2021 PCP: Pcp, No   Brief Narrative:  Carlos [name] is a 35 y.o. 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  Patient apparently has been having breakthrough seizures per discussion at admission per mother who was bring admitted to the hospital herself, concern the patient had been missing home medications due to her illness at home while patient's father was out of [facility] course notable for seizures initially subsequently stabilized -Also had an upper GI bleed, endoscopy noted gastritis -Situation complicated by ongoing dysphagia and limited family decision-making capacity with mother's current hospitalization -Made multiple attempts, unable to contact patient's father on 12/10, 12/11 and 12/12   Assessment & Plan:   Acute metabolic encephalopathy on known baseline cerebral palsy, POA, resolving -in the setting of acute breakthrough seizure as below -  -Clinically improved almost close to baseline   Acute breakthrough seizure, concern for medication noncompliance/mis-administration -Likely secondary to missed doses of AEDs from caregiver illness and metabolic abnormalities  -continue Keppra, lamictal and Vimpat -Seen by neurology earlier this admission, EEG suggested severe diffuse encephalopathy without seizures or definitive epileptiform discharges -Stable now on current AEDs  Acute on chronic failure to thrive,  Oropharyngeal dysphagia Severe protein calorie malnutrition -Situation complicated by mother's current hospitalization, inability to make decisions,, patient's father has been difficult to reach to discuss swallowing, goals of care, PEG tube etc.  -SLP following, per their assessment -patient is at high risk of aspiration -Currently tolerating thin liquids without significant  issues -Discussed with SLP, appears to be tolerating dysphagia 1 diet and thin liquids without concerns, per SLP- PEG in this situation may be helpful to supplement p.o.'s and ensure medication compliance, risk of dysphagia progressing, discussed with his father 12/9, he still seems undecided and ideally would like to avoid a PEG tube now, at least until patient's main caregiver and mom can also participate in such decisions. -Made multiple attempts to contact patient's father-on 12/10, 12/11 and 2 attempts today 12/12, will keep trying, mother remains hospitalized and not clear enough to make decisions for her son yet  Goals of care -Patient is bed bound, nonverbal, total assist, poorly interactive, high risk for aspiration, also a poor candidate for PEG/similar tube placement given mental status and high risk for self-injury and infection -See discussion above, likely deferring PEG tube for now, anticipate need for this in the future  Acute anemia likely secondary to blood loss, stabilizing -Seen by GI in consultation, had heme positive stools initially- upper endoscopy remarkable for esophagitis, severe non-ulcerated gastritis without overt bleeding. Flex sig without overt findings - Continue PPI. - hb stabilized - anemia panel consistent with chronic disease  Hypovolemic hypernatremia, severe Hypokalemia, ongoing -resolved -replaced K   AKI, Resolved -stop IVF  Urinary retention -Foley placed on admission 11/30 in the ED -removed 12/7   Questionable acute pancreatitis -Based on imaging, physical exam somewhat limited given patient's baseline mental status with acute metabolic encephalopathy overlying  -Imaging unremarkable for acute findings to explain pancreatitis with normal-appearing gallbladder and common bile duct within normal limits  -Lipase 185 on admission, repeat was 117 -He is tolerating diet, no vomiting, no epigastric tenderness   Reported history of diabetes, well  controlled A1c 6.1 Continue sliding scale    DVT prophylaxis: Add Lovenox Code Status: Full code Family Communication: No family at bedside, was able to  reach patient's father after multiple attempts on 12/7, was unable to reach father 12/8, updated him 12/9, left message 12/10, 12/11, 12/12 x2  Status is: Inpatient  Dispo: The patient is from: Home              Anticipated d/c is to: To be determined, likely home              Anticipated d/c date is: To be determined              Patient currently not medically stable for discharge  Consultants:  Neurology, GI  Procedures:  Upper endoscopy  Antimicrobials:  Discontinued 10/17/2021  Subjective: -No events overnight, remains mildly tachycardic, afebrile, tolerating dysphagia 1 diet, thin liquids, meds etc.  Objective: Vitals:   10/28/21 0328 10/28/21 0500 10/28/21 0728 10/28/21 1125  BP: 120/67  114/77 119/71  Pulse: (!) 116  (!) 114 (!) 116  Resp: _0 Temp: 98.6 F (37 C)  97.9 F (36.6 C) 98.1 F (36.7 C)  TempSrc: Oral  Oral Oral  SpO2: 98%  98% 99%  Weight:  39.1 kg      Intake/Output Summary (Last 24 hours) at 10/28/2021 1216 Last data filed at 10/28/2021 1006 Gross per 24 hour  Intake 200 ml  Output 700 ml  Net -500 ml   Filed Weights   10/25/21 0311 10/27/21 0500 10/28/21 0500  Weight: 44.3 kg 40 kg 39.1 kg    Examination:  Gen: Chronically ill cachectic male sitting up in bed, awake alert, nonverbal does not interact or follow commands, appears a bit restless CVS: S1-S2, regular rhythm, tachycardic Lungs: Clear anteriorly Abdomen: Soft, nontender, nondistended, bowel sounds present Extremities: No edema, contracted bilateral lower extremities Skin: Multiple blisters, skin breakdown over wrist, ankles, knees   Data Reviewed: I have personally reviewed following labs and imaging studies  CBC: Recent Labs  Lab 10/22/21 0246 10/24/21 0548 10/26/21 0313 10/27/21 0244 10/28/21 0419  WBC  8.9 8.0 8.9 7.6 8.6  HGB 8.9* 8.3* 9.0* 8.5* 9.0*  HCT 28.7* 26.4* 29.2* 27.1* 28.5*  MCV 78.8* 79.8* 80.0 79.2* 80.1  PLT 235 244 293 298 579   Basic Metabolic Panel: Recent Labs  Lab 10/22/21 0246 10/24/21 0548 10/26/21 0313 10/27/21 0244 10/28/21 0419  NA 144 139 139 141 141  K 3.0* 3.8 3.8 3.9 3.8  CL 110 107 102 105 104  CO2 _1 GLUCOSE 94 96 104* 94 106*  BUN 7 7 <5* 10 14  CREATININE 0.54* 0.49* 0.50* 0.48* 0.44*  CALCIUM 8.2* 8.2* 8.6* 8.6* 8.8*   GFR: CrCl cannot be calculated (Unknown ideal weight.). Liver Function Tests: No results for input(s): AST, ALT, ALKPHOS, BILITOT, PROT, ALBUMIN in the last 168 hours.  No results for input(s): LIPASE, AMYLASE in the last 168 hours.  No results for input(s): AMMONIA in the last 168 hours.  Coagulation Profile: No results for input(s): INR, PROTIME in the last 168 hours.  Cardiac Enzymes: No results for input(s): CKTOTAL, CKMB, CKMBINDEX, TROPONINI in the last 168 hours. BNP (last 3 results) No results for input(s): PROBNP in the last 8760 hours. HbA1C: No results for input(s): HGBA1C in the last 72 hours.  CBG: Recent Labs  Lab 10/27/21 1233 10/27/21 1613 10/27/21 2106 10/28/21 0758 10/28/21 1122  GLUCAP 86 120* 120* 104* 175*   Lipid Profile: No results for input(s): CHOL, HDL, LDLCALC, TRIG, CHOLHDL, LDLDIRECT in the last 72 hours.  Thyroid Function Tests: No  results for input(s): TSH, T4TOTAL, FREET4, T3FREE, THYROIDAB in the last 72 hours. Anemia Panel: No results for input(s): VITAMINB12, FOLATE, FERRITIN, TIBC, IRON, RETICCTPCT in the last 72 hours.  Sepsis Labs: Recent Labs  Lab 10/27/21 0244  PROCALCITON 19.40     No results found for this or any previous visit (from the past 240 hour(s)).    Radiology Studies: No results found.   Scheduled Meds:  chlorhexidine gluconate (MEDLINE KIT)  15 mL Mouth Rinse BID   Chlorhexidine Gluconate Cloth  6 each Topical Daily    feeding supplement  237 mL Oral TID WC & HS   insulin aspart  0-9 Units Subcutaneous TID WC   lacosamide  200 mg Oral BID   lamoTRIgine  225 mg Oral BID   levETIRAcetam  750 mg Oral BID   mouth rinse  15 mL Mouth Rinse 10 times per day   multivitamin with minerals  1 tablet Oral Daily   pantoprazole sodium  40 mg Oral BID   polyethylene glycol  17 g Oral Daily     LOS: 12 days   Domenic Polite, MD [facility]  If 7PM-7AM, please contact night-coverage [email or web address]  10/28/2021, 12:16 PM

## 2021-10-29 LAB — BASIC METABOLIC PANEL
Anion gap: 7 (ref 5–15)
BUN: 11 mg/dL (ref 6–20)
CO2: 27 mmol/L (ref 22–32)
Calcium: 8.7 mg/dL — ABNORMAL LOW (ref 8.9–10.3)
Chloride: 106 mmol/L (ref 98–111)
Creatinine, Ser: 0.57 mg/dL — ABNORMAL LOW (ref 0.61–1.24)
GFR, Estimated: >60 mL/min (ref >60)
Glucose, Bld: 85 mg/dL (ref 70–99)
Potassium: 3.9 mmol/L (ref 3.5–5.1)
Sodium: 140 mmol/L (ref 135–145)

## 2021-10-29 LAB — CBC
HCT: 27.3 % — ABNORMAL LOW (ref 39.0–52.0)
Hemoglobin: 8.4 g/dL — ABNORMAL LOW (ref 13.0–17.0)
MCH: 24.9 pg — ABNORMAL LOW (ref 26.0–34.0)
MCHC: 30.8 g/dL (ref 30.0–36.0)
MCV: 81 fL (ref 80.0–100.0)
Platelets: 282 10*3/uL (ref 150–400)
RBC: 3.37 MIL/uL — ABNORMAL LOW (ref 4.22–5.81)
RDW: 15.6 % — ABNORMAL HIGH (ref 11.5–15.5)
WBC: 6.4 10*3/uL (ref 4.0–10.5)
nRBC: 0 % (ref 0.0–0.2)

## 2021-10-29 LAB — GLUCOSE, CAPILLARY
Glucose-Capillary: 94 mg/dL (ref 70–99)
Glucose-Capillary: 127 mg/dL — ABNORMAL HIGH (ref 70–99)
Glucose-Capillary: 72 mg/dL (ref 70–99)
Glucose-Capillary: 109 mg/dL — ABNORMAL HIGH (ref 70–99)

## 2021-10-29 NOTE — Progress Notes (Signed)
Nutrition Follow-up  DOCUMENTATION CODES:   Underweight, suspect severe malnutrition  INTERVENTION:   - Continue feeding assistance TID with meals  - Continue Ensure Enlive po QID with meals and at bedtime, each supplement provides 350 kcal and 20 grams of protein   - Continue Magic Cup TID with meals, each supplement provides 290 kcal and 9 grams of protein   - Continue Vital Cuisine Shake TID with meals, each supplement provides 520 kcal and 22 grams of protein   - Continue MVI with minerals daily   NUTRITION DIAGNOSIS:   Inadequate oral intake related to dysphagia as evidenced by per patient/family report.  Progressing, being addressed via oral nutrition supplements  GOAL:   Patient will meet greater than or equal to 90% of their needs  Progressing  MONITOR:   PO intake, Supplement acceptance, Diet advancement, Labs, Weight trends, Skin, I & O's  REASON FOR ASSESSMENT:   Consult Assessment of nutrition requirement/status  ASSESSMENT:   35 year old male who presented to the ED on 11/30 with seizures. 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 Pt admitted with AMS, multiple seizures, AKI, hypovolemic hypernatremia, questionable acute pancreatitis.  12/02 - s/p EGD showing esophagitis and severe non-ulcerated gastritis without overt bleeding, s/p flexible sigmoidoscopy without overt findings 12/05 - full liquid diet 12/07 - diet advanced to dysphagia 1 with thin liquids  Per notes, MD has discussed PEG tube with pt's father who "definitely wants to hold off on PEG tube placement" at this time. Per MD, PEG tube may be needed in the future.  Spoke with NT and RN. Pt ate 100% of breakfast this morning including protein shake and Magic Cup on meal tray. Per NT, pt had an excellent  appetite and appeared hungry. Appreciate NT efforts in attentively feeding pt. Suspect pt will continue to have good PO intake if he is provided with the feeding assistance that he requires. Will continue with current oral nutrition supplements.  Admit weight: 44.4 kg Current weight: 42.6 kg   Meal Completion: 12/09: 80%, 5%, 10% 12/12: 80%, 95%, 75% 12/13: 100% 12/14: 100%  Medications reviewed and include: Ensure Enlive QID, SSI, lamictal, MVI with minerals, protonix, miralax  Labs reviewed: hemoglobin 8.4 CBG's: 72-127 x 24 hours  Diet Order:   Diet Order             DIET - DYS 1 Room service appropriate? No; Fluid consistency: Thin  Diet effective now                   EDUCATION NEEDS:   Not appropriate for education at this time  Skin:  Skin Assessment: Skin Integrity Issues: Other: skin tears to bilateral knees, penis, L elbow, and perineum  Last BM:  10/28/21 large type 6  Height:   Ht Readings from Last 1 Encounters:  07/07/20 5\' 6"  (1.676 m)    Weight:   Wt Readings from Last 1 Encounters:  10/29/21 42.6 kg    BMI:  Body mass index is 15.16 kg/m.  Estimated Nutritional Needs:   Kcal:  1550-1750  Protein:  75-90 grams  Fluid:  1.5-1.7 L    10/31/21, MS, RD, LDN Inpatient Clinical Dietitian Please see AMiON for contact information.

## 2021-10-29 NOTE — Progress Notes (Signed)
PROGRESS NOTE    BRIGHTON PILLEY  ZOX:[id number] DOB: [birth year] DOA: 10/16/2021 PCP: Pcp, No   Brief Narrative:  Carlos [name] is a 35 y.o. 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  Patient apparently has been having breakthrough seizures per discussion at admission per mother who was bring admitted to the hospital herself, concern the patient had been missing home medications due to her illness at home while patient's father was out of [name] course notable for seizures initially subsequently stabilized -Also had an upper GI bleed, endoscopy noted gastritis -Situation complicated by ongoing dysphagia and limited family decision-making capacity with mother's current hospitalization -Made multiple attempts, unable to contact patient's father on 12/10, 12/11 and 12/12 -Now reportedly, patient's father is staying with a friend as they are without heat and power for last 3 days,   Assessment & Plan:   Acute metabolic encephalopathy on known baseline cerebral palsy, POA, resolving -in the setting of acute breakthrough seizure as below -  -Clinically improved almost close to baseline   Acute breakthrough seizure, concern for medication noncompliance/mis-administration -Likely secondary to missed doses of AEDs from caregiver illness and metabolic abnormalities  -continue Keppra, lamictal and Vimpat -Seen by neurology earlier this admission, EEG suggested severe diffuse encephalopathy without seizures or definitive epileptiform discharges -Stable now on current AEDs  Acute on chronic failure to thrive,  Oropharyngeal dysphagia Severe protein calorie malnutrition -Situation complicated by mother's current hospitalization, inability to make decisions,, patient's father has been difficult to reach to discuss swallowing, goals of care, PEG tube etc.  -SLP following, per  their assessment -patient is at high risk of aspiration -Currently tolerating thin liquids without significant issues -Discussed with SLP, appears to be tolerating dysphagia 1 diet and thin liquids without concerns, per SLP- PEG in this situation may be helpful to supplement p.o.'s and ensure medication compliance, risk of dysphagia progressing, discussed with his father 12/9, he still seems undecided and ideally would like to avoid a PEG tube now, at least until patient's main caregiver and mom can also participate in such decisions. -Made multiple attempts to contact patient's father-on 12/10, 12/11 and 12/12 -I was finally able to reach patient's father today 12/13 a.m., he tells me that-he definitely wants to hold off on PEG tube placement which is reasonable at this time but may be needed in the future  Goals of care -Patient is bed bound, nonverbal, total assist, poorly interactive, high risk for aspiration, also a poor candidate for PEG/similar tube placement given mental status and high risk for self-injury and infection -See discussion above, no plans for PEG tube placement at this time  Acute anemia likely secondary to blood loss, stabilizing -Seen by GI in consultation, had heme positive stools initially- upper endoscopy remarkable for esophagitis, severe non-ulcerated gastritis without overt bleeding. Flex sig without overt findings - Continue PPI. - hb stabilized - anemia panel consistent with chronic disease  Hypovolemic hypernatremia, severe Hypokalemia, ongoing -resolved -replaced K   AKI, Resolved -stop IVF  Urinary retention -Foley placed on admission 11/30 in the ED -removed 12/7  Sinus tachycardia -Persistent, suspect autonomic neuropathy, other vitals are stable   Reported history of diabetes, well controlled A1c 6.1 Continue sliding scale    DVT prophylaxis: Add Lovenox Code Status: Full code Family Communication: No family at bedside, was able to reach  patient's father after multiple attempts on 12/7, was unable to reach father 12/8,  updated him 12/9, left message 12/10, 12/11, 12/12 x2, updated 12/13  Status is: Inpatient  Dispo: The patient is from: Home              Anticipated d/c is to: Home with his father              Anticipated d/c date is: To be determined              Patient currently not medically stable for discharge  Consultants:  Neurology, GI  Procedures:  Upper endoscopy  Antimicrobials:  Discontinued 10/17/2021  Subjective: -No events overnight, tolerating dysphagia 1 diet, thin liquids and medications, remains mildly tachycardic, other vitals are stable  Objective: Vitals:   10/28/21 2312 10/29/21 0305 10/29/21 0417 10/29/21 0732  BP: 110/62 (!) 107/52  106/67  Pulse: (!) 112 (!) 101  (!) 114  Resp: 20 (!) 23  16  Temp: 97.8 F (36.6 C) 98 F (36.7 C)  98.3 F (36.8 C)  TempSrc: Axillary Oral  Oral  SpO2: 98% 93%  99%  Weight:   42.6 kg     Intake/Output Summary (Last 24 hours) at 10/29/2021 1107 Last data filed at 10/29/2021 3734 Gross per 24 hour  Intake 1090 ml  Output 300 ml  Net 790 ml   Filed Weights   10/27/21 0500 10/28/21 0500 10/29/21 0417  Weight: 40 kg 39.1 kg 42.6 kg    Examination:  Gen: Chronically ill cachectic male sitting up in bed, awake alert, nonverbal does not interact or follow commands, appears a bit restless CVS: S1-S2, regular rhythm, tachycardic Lungs: Clear anteriorly Abdomen: Soft, nontender, nondistended, bowel sounds present Extremities: No edema, contracted bilateral lower extremities Skin: Multiple blisters, skin breakdown over wrist, ankles, knees   Data Reviewed: I have personally reviewed following labs and imaging studies  CBC: Recent Labs  Lab 10/24/21 0548 10/26/21 0313 10/27/21 0244 10/28/21 0419 10/29/21 0500  WBC 8.0 8.9 7.6 8.6 6.4  HGB 8.3* 9.0* 8.5* 9.0* 8.4*  HCT 26.4* 29.2* 27.1* 28.5* 27.3*  MCV 79.8* 80.0 79.2* 80.1 81.0  PLT  244 293 298 328 287   Basic Metabolic Panel: Recent Labs  Lab 10/24/21 0548 10/26/21 0313 10/27/21 0244 10/28/21 0419 10/29/21 0500  NA 139 139 141 141 140  K 3.8 3.8 3.9 3.8 3.9  CL 107 102 105 104 106  CO2 29 30 30 28 27   GLUCOSE 96 104* 94 106* 85  BUN 7 <5* 10 14 11   CREATININE 0.49* 0.50* 0.48* 0.44* 0.57*  CALCIUM 8.2* 8.6* 8.6* 8.8* 8.7*   GFR: CrCl cannot be calculated (Unknown ideal weight.). Liver Function Tests: No results for input(s): AST, ALT, ALKPHOS, BILITOT, PROT, ALBUMIN in the last 168 hours.  No results for input(s): LIPASE, AMYLASE in the last 168 hours.  No results for input(s): AMMONIA in the last 168 hours.  Coagulation Profile: No results for input(s): INR, PROTIME in the last 168 hours.  Cardiac Enzymes: No results for input(s): CKTOTAL, CKMB, CKMBINDEX, TROPONINI in the last 168 hours. BNP (last 3 results) No results for input(s): PROBNP in the last 8760 hours. HbA1C: No results for input(s): HGBA1C in the last 72 hours.  CBG: Recent Labs  Lab 10/28/21 0758 10/28/21 1122 10/28/21 1534 10/28/21 2116 10/29/21 0731  GLUCAP 104* 175* 113* 105* 94   Lipid Profile: No results for input(s): CHOL, HDL, LDLCALC, TRIG, CHOLHDL, LDLDIRECT in the last 72 hours.  Thyroid Function Tests: No results for input(s): TSH, T4TOTAL, FREET4, T3FREE, THYROIDAB  in the last 72 hours. Anemia Panel: No results for input(s): VITAMINB12, FOLATE, FERRITIN, TIBC, IRON, RETICCTPCT in the last 72 hours.  Sepsis Labs: Recent Labs  Lab 10/27/21 0244  PROCALCITON 19.40     No results found for this or any previous visit (from the past 240 hour(s)).    Radiology Studies: No results found.   Scheduled Meds:  chlorhexidine gluconate (MEDLINE KIT)  15 mL Mouth Rinse BID   Chlorhexidine Gluconate Cloth  6 each Topical Daily   enoxaparin (LOVENOX) injection  30 mg Subcutaneous Q24H   feeding supplement  237 mL Oral TID WC & HS   insulin aspart  0-9 Units  Subcutaneous TID WC   lacosamide  200 mg Oral BID   lamoTRIgine  225 mg Oral BID   levETIRAcetam  750 mg Oral BID   mouth rinse  15 mL Mouth Rinse 10 times per day   multivitamin with minerals  1 tablet Oral Daily   pantoprazole sodium  40 mg Oral BID   polyethylene glycol  17 g Oral Daily     LOS: 13 days   Domenic Polite, MD [facility]  If 7PM-7AM, please contact night-coverage [email or web address]  10/29/2021, 11:07 AM

## 2021-10-29 NOTE — TOC Progression Note (Signed)
Transition of Care [facility] - Progression Note    Patient Details  Name: Carlos [name] MRN: [id number] Date of Birth: 02-16-1986  Transition of Care [facility]) CM/SW Contact  Eduard Roux, [place] Phone Number: 10/29/2021, 11:26 AM  Clinical Narrative:     CSW called patient's father, Riley Lam, left voice message to return call.  Antony Blackbird, MSW, LCSW Clinical Social Worker         Expected Discharge Plan and Services                                                 Social Determinants of Health (SDOH) Interventions    Readmission Risk Interventions No flowsheet data found.

## 2021-10-30 LAB — GLUCOSE, CAPILLARY
Glucose-Capillary: 80 mg/dL (ref 70–99)
Glucose-Capillary: 88 mg/dL (ref 70–99)
Glucose-Capillary: 116 mg/dL — ABNORMAL HIGH (ref 70–99)
Glucose-Capillary: 117 mg/dL — ABNORMAL HIGH (ref 70–99)

## 2021-10-30 MED ORDER — PANTOPRAZOLE SODIUM 40 MG PO PACK: 40.0000 mg | PACK | Freq: Every day | ORAL | 0 refills | Status: AC

## 2021-10-30 MED ORDER — LACOSAMIDE 100 MG PO TABS: 200.0000 mg | ORAL_TABLET | Freq: Two times a day (BID) | ORAL | 0 refills | Status: AC

## 2021-10-30 MED ORDER — LEVETIRACETAM 750 MG PO TABS: 750.0000 mg | ORAL_TABLET | Freq: Two times a day (BID) | ORAL | Status: DC

## 2021-10-30 NOTE — Progress Notes (Signed)
Speech Language Pathology Treatment: Dysphagia  Patient Details Name: Carlos [name] MRN: [id number] DOB: 1986-09-02 Today's Date: 10/30/2021 Time: 7990-9400 SLP Time Calculation (min) (ACUTE ONLY): 11 min  Assessment / Plan / Recommendation Clinical Impression  Carlos [name] is alert and responsive to feeding. NT reports great appetite at breakfast and no concerns for aspiration. Repositioned upright. Kolter accepted several bites of pudding with swallowing behaviors consistent with performance on 12/9: he requires multiple spontaneous sub-swallows with each bolus. Swallowing is audible; but there are no s/sx of aspiration.  He has been managing his PO diet without adverse consequence and has gain a bit of weight per report.  He appears to be at baseline with swallowing/feeding. Recommend continuing dysphagia 1, thin liquids with the careful hand-feeding.  Palliative care is involved.  Pt's father has declined PEG at this time per Dr. Delene Loll note on 12/13. There are no further acute care SLP needs.  Our service will sign off. Please re-consult if we can offer further help.    HPI HPI: Carlos [name] is a 35 y.o. 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  Pt has had poor PO intake for at least 3 days PTA .  15 lb weight loss this year.  CXR and Head CT 11/30 with no acute findings.  Pt with concerns for GIB following bloody BM morning of 12/2, cleared by GI for PO diet.      SLP Plan  All goals met      Recommendations for follow up therapy are one component of a multi-disciplinary discharge planning process, led by the attending physician.  Recommendations may be updated based on patient status, additional functional criteria and insurance authorization.    Recommendations  Diet recommendations: Dysphagia 1 (puree);Thin liquid Liquids provided via:  Cup Medication Administration: Crushed with puree Supervision: Trained caregiver to feed patient Compensations: Slow rate;Small sips/bites Postural Changes and/or Swallow Maneuvers: Seated upright 90 degrees;Upright 30-60 min after meal                Oral Care Recommendations: Oral care BID Follow Up Recommendations: No SLP follow up Assistance recommended at discharge: Frequent or constant Supervision/Assistance SLP Visit Diagnosis: Dysphagia, oropharyngeal phase (R13.12) Plan: All goals met        .Deaisa Merida L. Tivis Ringer, [place] Office number [phone] Pager [phone]   [name]  10/30/2021, 11:36 AM

## 2021-10-30 NOTE — Progress Notes (Signed)
Palliative Care Progress Note  Palliative Care consult was requested on 12/10 for goals of care conversation. 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 He required total care at home and even though he is 35 years old was followed by our Pediatric Neurology specialists and Pediatric Complex Care Program (Palliative). The records indicate that he has been declining over the past few months with weight loss which is likely related to his swallowing issues and neurodegenerative disease progression as he ages. I will reach out to Elveria Rising, NP with the clinic who likely knows this patient and his family and may have additional information or a long standing relationship with him that can inform our medical decision making and goals of care conversations moving forward. It is likely that Grady's mother will not be able to resume her role as his primary caregiver given the severity of her medical problems- I strongly recommend that we pursue temporary and possibly permanent guardianship if his father is not willing to make decisions for him or be responsible for his care.  Anderson Malta, DO Palliative Medicine

## 2021-10-30 NOTE — Discharge Summary (Addendum)
Physician Discharge Summary  SHERROD TOOTHMAN VWU:[id number] DOB: 14-Nov-1986 DOA: 10/16/2021  PCP: Oneita Hurt, No  Admit date: 10/16/2021 Discharge date: 10/30/2021  Time spent: 35 minutes  Recommendations for Outpatient Follow-up:  PCP in 1 week, continue goals of care discussions with family Home health RN, aide Anticipate need for PEG tube in the future if plan is for continued aggressive care   Discharge Diagnoses:  Principal Problem:   AMS (altered mental status) Advanced cerebral palsy Severe protein calorie malnutrition Oropharyngeal dysphagia Seizure disorder Acute metabolic encephalopathy Breakthrough seizures Acute on chronic failure to thrive Oropharyngeal dysphagia Protein calorie malnutrition   Seizure (HCC)   Acute blood loss anemia   Hematochezia Upper GI bleed   Gastritis and gastroduodenitis   Discharge Condition: Stable  Diet recommendation: Dysphagia 1 diet, thin liquids  Filed Weights   10/27/21 0500 10/28/21 0500 10/29/21 0417  Weight: 40 kg 39.1 kg 42.6 kg    History of present illness:  Carlos [name] is a 35 y.o. 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  Patient apparently has been having breakthrough seizures per discussion at admission per mother who was bring admitted to the hospital herself, concern the patient had been missing home medications due to her illness at home while patient's father was out of town.  Hospital Course:   Acute metabolic encephalopathy on known baseline cerebral palsy, POA, resolving -in the setting of acute breakthrough seizure as below -  -Clinically improved almost close to baseline    Acute breakthrough seizure, concern for medication noncompliance/mis-administration -Likely secondary to missed doses of AEDs from caregiver illness and metabolic abnormalities  -continue Keppra, lamictal and Vimpat, doses  adjusted by neurology -Seen by neurology earlier this admission, EEG suggested severe diffuse encephalopathy without seizures or definitive epileptiform discharges -Stable now on current AEDs: Keppra 750 mg twice daily, Lamictal 225 mg twice daily, Vimpat 200 mg twice daily   Acute on chronic failure to thrive,  Oropharyngeal dysphagia Severe protein calorie malnutrition -Patient is total care and cared for by mother and father at home, current hospitalization was  complicated by mother's current hospitalization, ICU stay, encephalopathy and her inability to partake in decision making , patient's father had been difficult to reach initially this hospitalization -SLP following, per their assessment -patient is at high risk of aspiration -Currently tolerating dysphagia 1 diet, thin liquids without significant issues for the last week -Discussed with SLP again, Pt appears to be tolerating dysphagia 1 diet and thin liquids without concerns, per SLP- PEG in this situation may be helpful to supplement p.o.'s and ensure medication compliance, risk of dysphagia progressing, discussed with his father on multiple occasions, initially he was undecided and later reiterated that he would want to avoid a PEG tube as long as he is still able to tolerate some p.o. and get his meds. -I was finally able to reach patient's father again on 12/13., he tells me that-he definitely wants to hold off on PEG tube placement which is reasonable at this time but may be needed in the future   Goals of care -Patient is bed bound, nonverbal, total assist, poorly interactive, high risk for aspiration, also a poor candidate for PEG/similar tube placement given mental status and high risk for self-injury and infection -See discussion above, no plans for PEG tube placement at this time -Patient will be cared for by his father only at this time, as mother continues to  be ill and hospitalized and may not be able to participate in his  care for a while. -Home health RN, aide added   Acute anemia likely secondary to blood loss, heme positive stools -Seen by GI in consultation, had heme positive stools initially- upper endoscopy remarkable for esophagitis, severe non-ulcerated gastritis without overt bleeding. Flex sig without overt findings - Continue PPI. - hb stabilized - anemia panel consistent with chronic disease   Hypovolemic hypernatremia, severe Hypokalemia, ongoing -resolved -replaced K   AKI, Resolved -off IVF   Urinary retention -Foley placed on admission 11/30 in the ED -removed 12/7   Sinus tachycardia -Persistent, suspect autonomic neuropathy, other vitals are stable   Reported history of diabetes, well controlled A1c 6.1 -stable   Consultants:  Neurology, GI   Procedures:  Upper endoscopy    Discharge Exam: Vitals:   10/30/21 0734 10/30/21 1124  BP: 105/62 98/63  Pulse: (!) 103 (!) 109  Resp: 20 17  Temp: 98.4 F (36.9 C) 97.8 F (36.6 C)  SpO2: 99% 100%   Gen: Chronically ill cachectic male sitting up in bed, awake alert, nonverbal does not interact or follow commands, CVS: S1-S2, regular rhythm, tachycardic Lungs: Clear anteriorly Abdomen: Soft, nontender, nondistended, bowel sounds present Extremities: No edema, contracted bilateral lower extremities Skin: Multiple blisters, skin breakdown over wrist, ankles, knees    Discharge Instructions    Allergies as of 10/30/2021   No Known Allergies      Medication List     STOP taking these medications    lactulose 10 GM/15ML solution Commonly known as: CHRONULAC       TAKE these medications    cetirizine HCl 5 MG/5ML Soln Commonly known as: Zyrtec Take 10 mLs (10 mg total) by mouth daily as needed for itching.   Lacosamide 100 MG Tabs Take 2 tablets (200 mg total) by mouth 2 (two) times daily. 100 mg in the morning 200 mg at bedtime What changed:  how much to take when to take this Another medication  with the same name was removed. Continue taking this medication, and follow the directions you see here.   LaMICtal 200 MG tablet Generic drug: lamoTRIgine TAKE 1 TABLET BY MOUTH TWICE A DAY What changed: Another medication with the same name was removed. Continue taking this medication, and follow the directions you see here.   LaMICtal 25 MG tablet Generic drug: lamoTRIgine TAKE 1 TABLET BY MOUTH TWICE A DAY What changed: Another medication with the same name was removed. Continue taking this medication, and follow the directions you see here.   levETIRAcetam 750 MG tablet Commonly known as: KEPPRA Take 1 tablet (750 mg total) by mouth 2 (two) times daily. What changed:  how much to take Another medication with the same name was removed. Continue taking this medication, and follow the directions you see here.   pantoprazole sodium 40 mg Commonly known as: PROTONIX Take 40 mg by mouth daily.   polyethylene glycol 17 g packet Commonly known as: MIRALAX / GLYCOLAX Take 17 g by mouth daily as needed for mild constipation.   sodium phosphate Pediatric 3.5-9.5 GM/59ML enema Place 1 enema rectally every three (3) days as needed for constipation.   triamcinolone cream 0.1 % Commonly known as: KENALOG Apply small amount to rash twice per day What changed:  how much to take how to take this when to take this additional instructions       No Known Allergies  Follow-up Information  PCP. Schedule an appointment as soon as possible for a visit in 1 week(s).                   The results of significant diagnostics from this hospitalization (including imaging, microbiology, ancillary and laboratory) are listed below for reference.    Significant Diagnostic Studies: CT ABDOMEN PELVIS WO CONTRAST  Result Date: 10/16/2021 CLINICAL DATA:  Acute pancreatitis.  Mental status change. EXAM: CT ABDOMEN AND PELVIS WITHOUT CONTRAST TECHNIQUE: Multidetector CT imaging of the  abdomen and pelvis was performed following the standard protocol without IV contrast. COMPARISON:  CT abdomen and pelvis 07/07/2020. FINDINGS: Lower chest: No acute abnormality. 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 The largest is in the left lobe measuring 3.3 cm. These appear unchanged. No definite new liver lesions are identified. Gallbladder and bile ducts are within normal limits. Pancreas: There is questionable mild peripancreatic inflammation. Pancreas is otherwise grossly within normal limits, but not well delineated on this noncontrast study. Spleen: Normal in size without focal abnormality. Adrenals/Urinary Tract: Adrenal glands are unremarkable. Kidneys are normal, without renal calculi, focal lesion, or hydronephrosis. Bladder is unremarkable. Stomach/Bowel: There is distal esophageal wall thickening. Stomach moderately distended with air-fluid level. Limits. Appendix is not visualized. No evidence of bowel wall thickening, distention, or inflammatory changes. There is a large amount of stool throughout the colon. Vascular/Lymphatic: No significant vascular findings are present. No enlarged abdominal or pelvic lymph nodes. Reproductive: Prostate is unremarkable. Other: No abdominal wall hernia or abnormality. No focal fluid collections. No abdominopelvic ascites. Musculoskeletal: There are chronic changes of the left hip similar to the prior study. No acute fracture. IMPRESSION: 1. Can not exclude mild diffuse peripancreatic inflammation. No obvious fluid collections. 2. Distal esophageal wall thickening concerning for esophagitis. 3. Stomach is distended with air-fluid level. 4. Large stool burden. Electronically Signed   By: Darliss Cheney M.D.   On: 10/16/2021 18:17   CT Head Wo Contrast  Result Date: 10/16/2021 CLINICAL DATA:  Altered mental status EXAM: CT HEAD WITHOUT CONTRAST TECHNIQUE: Contiguous axial images were obtained from the base of  the skull through the vertex without intravenous contrast. COMPARISON:  Head CT dated 04/24/2016. 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 Similar appearance of the fourth ventricular size. No acute intracranial hemorrhage. No midline shift. Vascular: No hyperdense vessel or unexpected calcification. Skull: Normal. Negative for fracture or focal lesion. Sinuses/Orbits: No acute finding. Other: None IMPRESSION: 1. No acute intracranial pathology. 2. 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 Electronically Signed   By: Elgie Collard M.D.   On: 10/16/2021 18:15   DG Chest Portable 1 View  Result Date: 10/16/2021 CLINICAL DATA:  Altered mental status. EXAM: PORTABLE CHEST 1 VIEW COMPARISON:  04/24/2016. FINDINGS: Trachea is midline. Heart size normal. Lungs are clear. No pleural fluid. Nipple shadow projects over the lower left hemithorax. Stool is seen in the distal ascending, transverse and proximal descending colon. Gaseous distension of the stomach. Dextroconvex scoliosis. IMPRESSION: 1. No acute findings in the chest. 2. Stool in the visualized portions of the colon is indicative of constipation. Electronically Signed   By: Leanna Battles M.D.   On: 10/16/2021 15:22   EEG adult  Result Date: 10/16/2021 Charlsie Quest, MD     10/17/2021  8:25 AM Patient Name: Carlos [name] MRN: [id number] Epilepsy Attending: Charlsie Quest Referring Physician/Provider: Dr  Milon Dikes Date: 10/16/2021 Duration: 31.01 mins Patient history:  35 y.o. 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 EEG to evaluate for seizure. Level of alertness:  lethargic AEDs during EEG study: LEV, LCM Technical aspects: This EEG study was done with scalp electrodes positioned according to the 10-20  International system of electrode placement. Electrical activity was acquired at a sampling rate of  and reviewed with a high frequency filter of  and a low frequency filter of . EEG data were recorded continuously and digitally stored. Description: EEG showed continuous generalized low amplitude , at times sharply contoured, 3 to 6 Hz theta-delta slowing. Hyperventilation and photic stimulation were not performed.   ABNORMALITY - Continuous slow, generalized IMPRESSION: This study is suggestive of severe diffuse encephalopathy, nonspecific etiology. No seizures or definite epileptiform discharges were seen throughout the recording. Dr Wilford Corner was notified. Charlsie Quest    Microbiology: No results found for this or any previous visit (from the past 240 hour(s)).   Labs: Basic Metabolic Panel: Recent Labs  Lab 10/24/21 0548 10/26/21 0313 10/27/21 0244 10/28/21 0419 10/29/21 0500  NA 139 139 141 141 140  K 3.8 3.8 3.9 3.8 3.9  CL 107 102 105 104 106  CO2 GLUCOSE 96 104* 94 106* 85  BUN 7 <5* CREATININE 0.49* 0.50* 0.48* 0.44* 0.57*  CALCIUM 8.2* 8.6* 8.6* 8.8* 8.7*   Liver Function Tests: No results for input(s): AST, ALT, ALKPHOS, BILITOT, PROT, ALBUMIN in the last 168 hours. No results for input(s): LIPASE, AMYLASE in the last 168 hours. No results for input(s): AMMONIA in the last 168 hours. CBC: Recent Labs  Lab 10/24/21 0548 10/26/21 0313 10/27/21 0244 10/28/21 0419 10/29/21 0500  WBC 8.0 8.9 7.6 8.6 6.4  HGB 8.3* 9.0* 8.5* 9.0* 8.4*  HCT 26.4* 29.2* 27.1* 28.5* 27.3*  MCV 79.8* 80.0 79.2* 80.1 81.0  PLT 244 293 298 328 282   Cardiac Enzymes: No results for input(s): CKTOTAL, CKMB, CKMBINDEX, TROPONINI in the last 168 hours. BNP: BNP (last 3 results) No results for input(s): BNP in the last 8760 hours.  ProBNP (last 3 results) No results for input(s): PROBNP in the last 8760 hours.  CBG: Recent Labs  Lab 10/29/21 1139  10/29/21 1533 10/29/21 2129 10/30/21 0732 10/30/21 1123  GLUCAP 127* 72 109* 80 88       Signed:  Zannie Cove MD.  [facility] 10/30/2021, 1:09 PM

## 2021-10-30 NOTE — TOC Initial Note (Signed)
Transition of Care (TOC) - Initial/Assessment Note  Donn Pierini RN,BSN Transitions of Care Unit 4NP (Non Trauma)- RN Case Manager See Treatment Team for direct Phone #    Patient Details  Name: Carlos [name] MRN: [id number] Date of Birth: 1986/03/30  Transition of Care [facility]) CM/SW Contact:    Darrold Span, RN Phone Number: 10/30/2021, 4:33 PM  Clinical Narrative:                 Pt from home with parents, CM was able to reach dad-Douglas at  [phone] to discuss transition needs for patient. Per Riley Lam he is planning to come get his wife who is being discharged today and transport home this evening. Plan is to transition [place] home tomorrow with [facility].  Discussed with dad orders for HHRN/aide and discussed choice for services. Dad voiced that he is the paid caregiver for son, and asked if services could be arranged through the same agency that he works for. Explained that [facility] was billed differently that the services he bills for and that a different agency would need to be used, also explained that the services he bills for as caregiver [facility]) could remain in place- the [facility] would be an additional layer post discharge short term.  Riley Lam voiced understanding, and request that CM meet him tomorrow at bedside when he comes to pick up patient to review list of [facility] agencies. Per Riley Lam he plans to be here around 11am. CM will plan to meet the dad at bedside to go over [facility] and provide choice again with list. Will make [facility] referral pending dad's choice.   Dad voiced he wanted to get his wife home first and make sure she was doing ok and could be left alone safely before he comes to pick up their son tomorrow.   TOC to follow up in the AM.   Expected Discharge Plan: Home w Home Health Services Barriers to Discharge: Family Issues   Patient Goals and CMS Choice Patient states their goals for this hospitalization and ongoing recovery are::  (father reports plan is to take pt home  once mom is released from hospital on 12/14) CMS Medicare.gov Compare Post Acute Care list provided to:: Patient Represenative (must comment) (dad- Douglas) Choice offered to / list presented to : Parent  Expected Discharge Plan and Services Expected Discharge Plan: Home w Home Health Services In-house Referral: Clinical Social Work Discharge Planning Services: CM Consult Post Acute Care Choice: Home Health Living arrangements for the past 2 months: Single Family Home Expected Discharge Date: 10/31/21                         HH Arranged: RN, Nurse's Aide          Prior Living Arrangements/Services Living arrangements for the past 2 months: Single Family Home Lives with:: Parents Patient language and need for interpreter reviewed:: Yes Do you feel safe going back to the place where you live?: Yes      Need for Family Participation in Patient Care: Yes (Comment) Care giver support system in place?: Yes (comment) Current home services: DME, Homehealth aide Criminal Activity/Legal Involvement Pertinent to Current Situation/Hospitalization: No - Comment as needed  Activities of Daily Living Home Assistive Devices/Equipment: None ADL Screening (condition at time of admission) Patient's cognitive ability adequate to safely complete daily activities?: No Is the patient deaf or have difficulty hearing?: No Does the patient have difficulty seeing, even when wearing glasses/contacts?: Yes  Does the patient have difficulty concentrating, remembering, or making decisions?: Yes Patient able to express need for assistance with ADLs?: No Does the patient have difficulty dressing or bathing?: Yes Independently performs ADLs?: No Does the patient have difficulty walking or climbing stairs?: Yes Weakness of Legs: Both Weakness of Arms/Hands: Both  Permission Sought/Granted                  Emotional Assessment Appearance:: Developmentally appropriate Attitude/Demeanor/Rapport:  Other (comment) (non verbal) Affect (typically observed): Calm Orientation: : Oriented to Self Alcohol / Substance Use: Not Applicable Psych Involvement: No (comment)  Admission diagnosis:  Dehydration [E86.0] Lactic acidosis [E87.20] Hypernatremia [E87.0] Seizure (HCC) [R56.9] Acute kidney injury (HCC) [N17.9] Acute pancreatitis, unspecified complication status, unspecified pancreatitis type [K85.90] AMS (altered mental status) [R41.82] Patient Active Problem List   Diagnosis Date Noted   Acute blood loss anemia    Hematochezia    Abnormal finding on GI tract imaging    Gastritis and gastroduodenitis    AMS (altered mental status) 10/16/2021   Loss of weight 10/05/2020   Oppositional behavior 10/05/2020   Oral phase dysphagia    AKI (acute kidney injury) (HCC) 07/07/2020   Hyperproteinemia 07/07/2020   RBC microcytosis 07/07/2020   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   Status epilepticus (HCC)    Health maintenance examination 08/14/2016   Seizure (HCC) 04/23/2016   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   Constipation 07/25/2015   Partial epilepsy with impairment of consciousness (HCC) 03/14/2013   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 AT [facility] OF [address] [place] [address] Phone: [phone] Fax: [phone]     Social Determinants of Health (SDOH) Interventions     Readmission Risk Interventions No flowsheet data found.

## 2021-10-30 NOTE — Progress Notes (Signed)
°  Patient's mother is in the hospital on another unit- the CSW assigned to that unit (CSW ReAndre) was able to reach patient's father, Riley Lam and noted the below documentation:    Transition of Care [facility]) CM/SW Contact:    Ralene Bathe, LCSWA Phone Number: 10/29/2021, 12:15 PM   Clinical Narrative:                 After being unable to get in contact with the patient's spouse, CSW contacted the patient's relative Devorah White, [phone].  Ms. White informed CSW that the patient's spouse's phone was broken and that he was using the patient's phone and can be reached at [phone].  Ms. Cliffton Asters also confirmed that the patient, patient's spouse, and patient's son (who has special needs) live in the home.   CSW attempted to contact the spouse at the number given above [phone] and there was no answer.  CSW left a VM requesting  a returned call.   CSW then called the home number listed for the patient, [phone].  The spouse answered.  CSW introduced self and inquired about d/c planning and the concerns about the electricity being off in the home.  The spouse informed CSW that he is retired and is the caregiver for the son.  The spouse typically receives income for being the son's caregiver, but due to the son being in the hospital, he cannot bill.  Also with the wife being in the hospital, and this had led to some financial strain.  The husband informed CSW that he was able to pay the electricity bill yesterday and the home now has lights and heat.  The spouse reports that everything is now in place for the patient to return home when she is medically ready.

## 2021-10-31 DIAGNOSIS — K2990 Gastroduodenitis, unspecified, without bleeding: Secondary | ICD-10-CM

## 2021-10-31 DIAGNOSIS — K2970 Gastritis, unspecified, without bleeding: Secondary | ICD-10-CM

## 2021-10-31 LAB — GLUCOSE, CAPILLARY
Glucose-Capillary: 100 mg/dL — ABNORMAL HIGH (ref 70–99)
Glucose-Capillary: 123 mg/dL — ABNORMAL HIGH (ref 70–99)

## 2021-10-31 NOTE — Discharge Summary (Signed)
Physician Discharge Summary  Carlos [name] WUJ:[id number] DOB: Aug 23, 1986 DOA: 10/16/2021  PCP: Oneita Hurt, No  Admit date: 10/16/2021 Discharge date: 10/31/2021  Time spent: 35 minutes  Recommendations for Outpatient Follow-up:  PCP in 1 week, continue goals of care discussions with family Home health RN, aide Anticipate need for PEG tube in the future if plan is for continued aggressive care   Discharge Diagnoses:  Principal Problem:   AMS (altered mental status) Advanced cerebral palsy Severe protein calorie malnutrition Oropharyngeal dysphagia Seizure disorder Acute metabolic encephalopathy Breakthrough seizures Acute on chronic failure to thrive Oropharyngeal dysphagia Protein calorie malnutrition   Seizure (HCC)   Acute blood loss anemia   Hematochezia Upper GI bleed   Gastritis and gastroduodenitis   Discharge Condition: Stable  Diet recommendation: Dysphagia 1 diet, thin liquids  Filed Weights   10/27/21 0500 10/28/21 0500 10/29/21 0417  Weight: 40 kg 39.1 kg 42.6 kg    History of present illness:  Carlos [name] is a 35 y.o. 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  Patient apparently has been having breakthrough seizures per discussion at admission per mother who was bring admitted to the hospital herself, concern the patient had been missing home medications due to her illness at home while patient's father was out of town.  Planned discharge yesterday held by family request, no acute issues/events overnight. Stable and agreeable for discharge home today without delay.  Hospital Course:   Acute metabolic encephalopathy on known baseline cerebral palsy, POA, resolving -in the setting of acute breakthrough seizure as below -  -Clinically improved almost close to baseline    Acute breakthrough seizure, concern for medication  noncompliance/mis-administration -Likely secondary to missed doses of AEDs from caregiver illness and metabolic abnormalities  -continue Keppra, lamictal and Vimpat, doses adjusted by neurology -Seen by neurology earlier this admission, EEG suggested severe diffuse encephalopathy without seizures or definitive epileptiform discharges -Stable now on current AEDs: Keppra 750 mg twice daily, Lamictal 225 mg twice daily, Vimpat 200 mg twice daily   Acute on chronic failure to thrive,  Oropharyngeal dysphagia Severe protein calorie malnutrition -Patient is total care and cared for by mother and father at home, current hospitalization was  complicated by mother's current hospitalization, ICU stay, encephalopathy and her inability to partake in decision making , patient's father had been difficult to reach initially this hospitalization -SLP following, per their assessment -patient is at high risk of aspiration -Currently tolerating dysphagia 1 diet, thin liquids without significant issues for the last week -Discussed with SLP again, Pt appears to be tolerating dysphagia 1 diet and thin liquids without concerns, per SLP- PEG in this situation may be helpful to supplement p.o.'s and ensure medication compliance, risk of dysphagia progressing, discussed with his father on multiple occasions, initially he was undecided and later reiterated that he would want to avoid a PEG tube as long as he is still able to tolerate some p.o. and get his meds. -I was finally able to reach patient's father again on 12/13., he tells me that-he definitely wants to hold off on PEG tube placement which is reasonable at this time but may be needed in the future   Goals of care -Patient is bed bound, nonverbal, total assist, poorly interactive, high risk for aspiration, also a poor candidate for PEG/similar tube placement given mental status and high risk for self-injury and infection -See discussion above, no plans for PEG  tube  placement at this time -Patient will be cared for by his father only at this time, as mother continues to be ill and hospitalized and may not be able to participate in his care for a while. -Home health RN, aide added   Acute anemia likely secondary to blood loss, heme positive stools -Seen by GI in consultation, had heme positive stools initially- upper endoscopy remarkable for esophagitis, severe non-ulcerated gastritis without overt bleeding. Flex sig without overt findings - Continue PPI. - hb stabilized - anemia panel consistent with chronic disease   Hypovolemic hypernatremia, severe Hypokalemia, ongoing -resolved -replaced K   AKI, Resolved -off IVF   Urinary retention -Foley placed on admission 11/30 in the ED -removed 12/7   Sinus tachycardia -Persistent, suspect autonomic neuropathy, other vitals are stable   Reported history of diabetes, well controlled A1c 6.1 -stable   Consultants:  Neurology, GI   Procedures:  Upper endoscopy    Discharge Exam: Vitals:   10/30/21 2316 10/31/21 0314  BP: 116/80   Pulse: (!) 119   Resp: 10   Temp: 98.2 F (36.8 C) 97.6 F (36.4 C)  SpO2: 95%    Gen: Chronically ill cachectic male sitting up in bed, awake alert, nonverbal does not interact or follow commands, CVS: S1-S2, regular rhythm, tachycardic Lungs: Clear anteriorly Abdomen: Soft, nontender, nondistended, bowel sounds present Extremities: No edema, contracted bilateral lower extremities Skin: Multiple blisters, skin breakdown over wrist, ankles, knees    Discharge Instructions   Discharge Instructions     Amb Referral to Palliative Care   Complete by: As directed    Advanced cerebral palsy, total care, seizure disorder, malnutrition and worsening dysphagia, needs goals of care discussions, care coordination and advance care planning      Allergies as of 10/31/2021   No Known Allergies      Medication List     STOP taking these medications     lactulose 10 GM/15ML solution Commonly known as: CHRONULAC       TAKE these medications    cetirizine HCl 5 MG/5ML Soln Commonly known as: Zyrtec Take 10 mLs (10 mg total) by mouth daily as needed for itching.   Lacosamide 100 MG Tabs Take 2 tablets (200 mg total) by mouth 2 (two) times daily. 100 mg in the morning 200 mg at bedtime What changed:  how much to take when to take this Another medication with the same name was removed. Continue taking this medication, and follow the directions you see here.   LaMICtal 200 MG tablet Generic drug: lamoTRIgine TAKE 1 TABLET BY MOUTH TWICE A DAY What changed: Another medication with the same name was removed. Continue taking this medication, and follow the directions you see here.   LaMICtal 25 MG tablet Generic drug: lamoTRIgine TAKE 1 TABLET BY MOUTH TWICE A DAY What changed: Another medication with the same name was removed. Continue taking this medication, and follow the directions you see here.   levETIRAcetam 750 MG tablet Commonly known as: KEPPRA Take 1 tablet (750 mg total) by mouth 2 (two) times daily. What changed:  how much to take Another medication with the same name was removed. Continue taking this medication, and follow the directions you see here.   pantoprazole sodium 40 mg Commonly known as: PROTONIX Take 40 mg by mouth daily.   polyethylene glycol 17 g packet Commonly known as: MIRALAX / GLYCOLAX Take 17 g by mouth daily as needed for mild constipation.   sodium phosphate Pediatric  3.5-9.5 GM/59ML enema Place 1 enema rectally every three (3) days as needed for constipation.   triamcinolone cream 0.1 % Commonly known as: KENALOG Apply small amount to rash twice per day What changed:  how much to take how to take this when to take this additional instructions       No Known Allergies  Follow-up Information     PCP. Schedule an appointment as soon as possible for a visit in 1 week(s).                    The results of significant diagnostics from this hospitalization (including imaging, microbiology, ancillary and laboratory) are listed below for reference.    Significant Diagnostic Studies: CT ABDOMEN PELVIS WO CONTRAST  Result Date: 10/16/2021 CLINICAL DATA:  Acute pancreatitis.  Mental status change. EXAM: CT ABDOMEN AND PELVIS WITHOUT CONTRAST TECHNIQUE: Multidetector CT imaging of the abdomen and pelvis was performed following the standard protocol without IV contrast. COMPARISON:  CT abdomen and pelvis 07/07/2020. FINDINGS: Lower chest: No acute abnormality. 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 The largest is in the left lobe measuring 3.3 cm. These appear unchanged. No definite new liver lesions are identified. Gallbladder and bile ducts are within normal limits. Pancreas: There is questionable mild peripancreatic inflammation. Pancreas is otherwise grossly within normal limits, but not well delineated on this noncontrast study. Spleen: Normal in size without focal abnormality. Adrenals/Urinary Tract: Adrenal glands are unremarkable. Kidneys are normal, without renal calculi, focal lesion, or hydronephrosis. Bladder is unremarkable. Stomach/Bowel: There is distal esophageal wall thickening. Stomach moderately distended with air-fluid level. Limits. Appendix is not visualized. No evidence of bowel wall thickening, distention, or inflammatory changes. There is a large amount of stool throughout the colon. Vascular/Lymphatic: No significant vascular findings are present. No enlarged abdominal or pelvic lymph nodes. Reproductive: Prostate is unremarkable. Other: No abdominal wall hernia or abnormality. No focal fluid collections. No abdominopelvic ascites. Musculoskeletal: There are chronic changes of the left hip similar to the prior study. No acute fracture. IMPRESSION: 1. Can not exclude mild diffuse peripancreatic  inflammation. No obvious fluid collections. 2. Distal esophageal wall thickening concerning for esophagitis. 3. Stomach is distended with air-fluid level. 4. Large stool burden. Electronically Signed   By: Darliss Cheney M.D.   On: 10/16/2021 18:17   CT Head Wo Contrast  Result Date: 10/16/2021 CLINICAL DATA:  Altered mental status EXAM: CT HEAD WITHOUT CONTRAST TECHNIQUE: Contiguous axial images were obtained from the base of the skull through the vertex without intravenous contrast. COMPARISON:  Head CT dated 04/24/2016. 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 Similar appearance of the fourth ventricular size. No acute intracranial hemorrhage. No midline shift. Vascular: No hyperdense vessel or unexpected calcification. Skull: Normal. Negative for fracture or focal lesion. Sinuses/Orbits: No acute finding. Other: None IMPRESSION: 1. No acute intracranial pathology. 2. 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 Electronically Signed   By: Elgie Collard M.D.   On: 10/16/2021 18:15   DG Chest Portable 1 View  Result Date: 10/16/2021 CLINICAL DATA:  Altered mental status. EXAM: PORTABLE CHEST 1 VIEW COMPARISON:  04/24/2016. FINDINGS: Trachea is midline. Heart size normal. Lungs are clear. No pleural fluid. Nipple shadow projects over the lower left hemithorax. Stool is seen in the distal ascending, transverse and proximal descending colon. Gaseous distension of the stomach. Dextroconvex scoliosis. IMPRESSION: 1. No acute  findings in the chest. 2. Stool in the visualized portions of the colon is indicative of constipation. Electronically Signed   By: Leanna Battles M.D.   On: 10/16/2021 15:22   EEG adult  Result Date: 10/16/2021 Charlsie Quest, MD     10/17/2021  8:25 AM Patient Name: DEMETRUS PAVAO MRN: [id number] Epilepsy Attending: Charlsie Quest Referring  Physician/Provider: Dr Milon Dikes Date: 10/16/2021 Duration: 31.01 mins Patient history:  35 y.o. 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 EEG to evaluate for seizure. Level of alertness:  lethargic AEDs during EEG study: LEV, LCM Technical aspects: This EEG study was done with scalp electrodes positioned according to the 10-20 International system of electrode placement. Electrical activity was acquired at a sampling rate of 500Hz  and reviewed with a high frequency filter of 70Hz  and a low frequency filter of 1Hz . EEG data were recorded continuously and digitally stored. Description: EEG showed continuous generalized low amplitude , at times sharply contoured, 3 to 6 Hz theta-delta slowing. Hyperventilation and photic stimulation were not performed.   ABNORMALITY - Continuous slow, generalized IMPRESSION: This study is suggestive of severe diffuse encephalopathy, nonspecific etiology. No seizures or definite epileptiform discharges were seen throughout the recording. Dr was notified.    Microbiology: No results found for this or any previous visit (from the past 240 hour(s)).   Labs: Basic Metabolic Panel: Recent Labs  Lab 10/26/21 0313 10/27/21 0244 10/28/21 0419 10/29/21 0500  NA 139 141 141 140  K 3.8 3.9 3.8 3.9  CL 102 105 104 106  CO2 30 30 28 27   GLUCOSE 104* 94 106* 85  BUN <5* 10 14 11   CREATININE 0.50* 0.48* 0.44* 0.57*  CALCIUM 8.6* 8.6* 8.8* 8.7*    Liver Function Tests: No results for input(s): AST, ALT, ALKPHOS, BILITOT, PROT, ALBUMIN in the last 168 hours. No results for input(s): LIPASE, AMYLASE in the last 168 hours. No results for input(s): AMMONIA in the last 168 hours. CBC: Recent Labs  Lab 10/26/21 0313 10/27/21 0244 10/28/21 0419 10/29/21 0500  WBC 8.9 7.6 8.6 6.4  HGB 9.0* 8.5* 9.0* 8.4*  HCT 29.2* 27.1* 28.5* 27.3*  MCV  80.0 79.2* 80.1 81.0  PLT 293 298 328 282    Cardiac Enzymes: No results for input(s): CKTOTAL, CKMB, CKMBINDEX, TROPONINI in the last 168 hours. BNP: BNP (last 3 results) No results for input(s): BNP in the last 8760 hours.  ProBNP (last 3 results) No results for input(s): PROBNP in the last 8760 hours.  CBG: Recent Labs  Lab 10/29/21 2129 10/30/21 0732 10/30/21 1123 10/30/21 1515 10/30/21 2128  GLUCAP 109* 80 88 116* 117*        Signed:  11/01/21 MD.  [facility] 10/31/2021, 7:31 AM

## 2021-10-31 NOTE — TOC Transition Note (Signed)
Transition of Care (TOC) - CM/SW Discharge Note Donn Pierini RN,BSN Transitions of Care Unit 4NP (Non Trauma)- RN Case Manager See Treatment Team for direct Phone #    Patient Details  Name: Carlos [name] MRN: [id number] Date of Birth: 1986/11/15  Transition of Care [facility]) CM/SW Contact:  Darrold Span, RN Phone Number: 10/31/2021, 11:37 AM   Clinical Narrative:    CM went to room at 11am as previously arranged with dad to speak with him about [facility] needs with plans for him to transport home this am. Dad was not there at bedside yet. Returned again at [employer]- with dad still not present.  Call made to the home- number that dad was reached at yesterday- [phone]- initially no one picked up, but as msg was being left- mother picked up the phone- informed her who was calling and purpose of call. She stated that they would be on their way to pick up patient shortly. Asked if they could give a timeframe as CM needed to speak with dad about HH arrangements and had planned to meet him this am at 11am- mother then asked what the [facility] orders where for- which CM informed her HHRN was ordered- mother then stated that she did not feel patient would need that post discharge- and declined HH referral at this time. Explained should they change their minds they could inform us when they got here and CM could still meet with them. She voiced understanding however still stated that they would not need HH services, and they would come shortly to transport Viola home.    Final next level of care: Home/Self Care Barriers to Discharge: Barriers Resolved   Patient Goals and CMS Choice Patient states their goals for this hospitalization and ongoing recovery are::  (father reports plan is to take pt home once mom is released from hospital on 12/14) CMS Medicare.gov Compare Post Acute Care list provided to:: Patient Represenative (must comment) (dad- Douglas) Choice offered to / list presented to :  Parent  Discharge Placement                 Home       Discharge Plan and Services In-house Referral: Clinical Social Work Discharge Planning Services: CM Consult Post Acute Care Choice: Home Health                    HH Arranged: Patient Refused HH (parents declined services) HH Agency: NA        Social Determinants of Health (SDOH) Interventions     Readmission Risk Interventions Readmission Risk Prevention Plan 10/31/2021  Medication Screening Complete  Transportation Screening Complete  Some recent data might be hidden

## 2021-10-31 NOTE — Progress Notes (Signed)
The parents of the patient are here to pick him up for discharge. Father wanted to place the patient in a wheelchair to take him out to the car. I explained to the father that he would have to pick the patient up and place him in the wheelchair , because the patient is contracted and does not look as if he can sit in a regular wheelchair and we wanted the patient to be safe. The father agreed. Father at the bedside getting patient dressed. Mother being very negative when the nurse or myself try to speak about anything about the patient. Discharge instructions given to parents and gave time for questions.

## 2021-10-31 NOTE — Progress Notes (Signed)
Pt with discharge orders, discharge paperwork reviewed with patients parents (legal guardians)  and all questions answered. IV's removed, pt taken down via wheelchair in father's lap with all belongings.

## 2021-11-01 ENCOUNTER — Encounter (INDEPENDENT_AMBULATORY_CARE_PROVIDER_SITE_OTHER): Payer: Self-pay

## 2021-11-01 NOTE — Progress Notes (Signed)
Discussed with dad orders for HHRN/aide and discussed choice for services. Dad voiced that he is the paid caregiver for son, and asked if services could be arranged through the same agency that he works for. Explained that [facility] was billed differently that the services he bills for and that a different agency would need to be used, also explained that the services he bills for as caregiver [facility]) could remain in place- the [facility] would be an additional layer post discharge short term Asked if they could give a timeframe as CM needed to speak with dad about HH arrangements and had planned to meet him this am at 11am- mother then asked what the [facility] orders where for- which CM informed her HHRN was ordered- mother then stated that she did not feel patient would need that post discharge- and declined HH referral at this time.  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 He had an MRI 07/20/90 confirmed colpocephaly, fenestrated falx, hypodensities in the frontal lobes, enlarged lateral and third ventricle and an elevated third ventricle. 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 Arm, left hand fisted and wrist flexed, left hip subluxation, spastic quadriparesis greater in lower extremities, unable to stand Nutrition: Severe protein malnutrition, acute metabolic encephalopathy, oropharyngeal dysphagia. Reported diabetes A1C 6.1  Symptom management/Treatments: Seizures: Keppra, Vimpat, Lamicatal Constipation: Lactulose, miralax  Past/failed meds:  Feeding: dysphagia 1 diet and thin liquids, oral feedings does not have a feeding tube DME:  fax  Formula:  Current regimen:  Day feeds: mL @  mL/hr x  feeds   Overnight feeds:  mL/hr x  hours from   FWF:   Notes:  Supplements:   Recent Events: Admitted 10/16/2021-AMS, (missed meds with mother's illness) anemia, GI bleed, pancreatic inflammation   Care Needs/Upcoming Plans: Mom declined Home Health services, Dad is paid caregiver  Providers: PCP Lorenz Coaster, MD [facility] Child Neurology and Pediatric Complex Care) ph [phone] fax [phone] Elveria Rising NP-C [facility] Pediatric Complex Care) ph [phone] fax [phone] John Giovanni, RD, LDN [facility] Pediatric Complex Care Dietitian) Ph. [phone] Vita Barley, RN [facility] Pediatric Complex Care Case Manager) ph [phone] fax [phone] Doristine Locks, DO [facility]) Wynne Dust, NP (Palliative Medicine Team) ph. [phone]  Community support/services:  Equipment/DME Supplies Providers  Goals of care: Father declines PEG tube 09/2021  Advanced care planning: Need to discuss goals of  care  Psychosocial:   Diagnostics/Screenings: 04/25/16 - rEEG - This EEG recorded 2 brief discharges lasting  less than 20 seconds which are concerning for possible seizure, though are not associated with clinical correlate and could be interictal in nature(e.g. tains of sharp waves). PLEDs are typically not felt to be an ictal phenomenon but can be seen postictally, associated with structural lesions, or rarely represent ongoing seizure.  Ritta Slot, MD 07/09/20 - rEEG - This study showed evidence of epileptogenicity arising from left hemisphere due to underlying structural abnormality.  There is also evidence of independent epileptogenic city from right frontotemporal region.  Additionally there is evidence of moderate diffuse encephalopathy, nonspecific etiology. Charlsie Quest MD 07/10/2020 Swallow study: Dysphagia, oropharyngeal phase, no aspiration seen but at moderate risk 10/06/2021 [facility] severe diffuse encephalopathy without seizures or definitive epileptiform discharges 10/16/2021 CT of ABD Pelvis: hypodense hepatic lesions previously characterized as hemangiomas largest left lobe measuring 3.3 cm. mild peripancreatic inflammation, Distal esophageal wall thickening concerning for esophagitis. Stomach is distended with air-fluid level. 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 10/18/2001 Upper endoscopy and sigmoidoscopy esophagitis, severe non-ulcerated gastritis without overt bleeding. Flex sig without overt findings 10/18/2021 EKG: sinus tachycardia, rt atrial enlargement, rt superior axis deviation  Elveria Rising NP-C and Lorenz Coaster, MD Pediatric Complex Care Program Ph: [phone] Fax: [phone]

## 2021-11-14 ENCOUNTER — Ambulatory Visit (INDEPENDENT_AMBULATORY_CARE_PROVIDER_SITE_OTHER): Payer: Medicaid Other | Admitting: Family

## 2021-11-14 ENCOUNTER — Encounter (INDEPENDENT_AMBULATORY_CARE_PROVIDER_SITE_OTHER): Payer: Self-pay | Admitting: Family

## 2021-11-29 ENCOUNTER — Telehealth (INDEPENDENT_AMBULATORY_CARE_PROVIDER_SITE_OTHER): Payer: Self-pay | Admitting: Family

## 2021-11-29 NOTE — Telephone Encounter (Signed)
I called and left a message for a parent to return my call. Carlos [name] missed an appointment at the end of December. He has not been seen since 2021. TG

## 2021-12-02 ENCOUNTER — Other Ambulatory Visit (INDEPENDENT_AMBULATORY_CARE_PROVIDER_SITE_OTHER): Payer: Self-pay | Admitting: Family

## 2021-12-02 DIAGNOSIS — G40209 Localization-related (focal) (partial) symptomatic epilepsy and epileptic syndromes with complex partial seizures, not intractable, without status epilepticus: Secondary | ICD-10-CM

## 2021-12-02 NOTE — Telephone Encounter (Signed)
I called Mom and explained that Carlos [name] needs a visit in order for me to continue to refill his medication. She accepted a virtual visit tomorrow at [employer]. TG

## 2021-12-03 ENCOUNTER — Telehealth (INDEPENDENT_AMBULATORY_CARE_PROVIDER_SITE_OTHER): Payer: Medicaid Other | Admitting: Family

## 2021-12-04 ENCOUNTER — Ambulatory Visit (INDEPENDENT_AMBULATORY_CARE_PROVIDER_SITE_OTHER): Payer: Medicaid Other | Admitting: Family

## 2021-12-04 ENCOUNTER — Encounter (INDEPENDENT_AMBULATORY_CARE_PROVIDER_SITE_OTHER): Payer: Self-pay | Admitting: Family

## 2021-12-05 ENCOUNTER — Other Ambulatory Visit (INDEPENDENT_AMBULATORY_CARE_PROVIDER_SITE_OTHER): Payer: Self-pay | Admitting: Family

## 2021-12-05 DIAGNOSIS — G40319 Generalized idiopathic epilepsy and epileptic syndromes, intractable, without status epilepticus: Secondary | ICD-10-CM

## 2021-12-05 MED ORDER — KEPPRA 750 MG PO TABS
ORAL_TABLET | ORAL | 0 refills | Status: AC
Start: 2021-12-05 — End: ?

## 2021-12-26 ENCOUNTER — Ambulatory Visit (INDEPENDENT_AMBULATORY_CARE_PROVIDER_SITE_OTHER): Payer: Medicaid Other | Admitting: Family
